# Patient Record
Sex: Female | Born: 1950 | Race: White | Hispanic: No | Marital: Married | Smoking: Never smoker
Health system: Southern US, Community
[De-identification: ages and names within clinical notes are randomized; demographics above are authoritative.]

## PROBLEM LIST (undated history)

## (undated) DIAGNOSIS — I2510 Atherosclerotic heart disease of native coronary artery without angina pectoris: Secondary | ICD-10-CM

## (undated) DIAGNOSIS — R0600 Dyspnea, unspecified: Secondary | ICD-10-CM

## (undated) DIAGNOSIS — I10 Essential (primary) hypertension: Secondary | ICD-10-CM

## (undated) DIAGNOSIS — J189 Pneumonia, unspecified organism: Secondary | ICD-10-CM

## (undated) DIAGNOSIS — K219 Gastro-esophageal reflux disease without esophagitis: Secondary | ICD-10-CM

## (undated) DIAGNOSIS — J45909 Unspecified asthma, uncomplicated: Secondary | ICD-10-CM

## (undated) DIAGNOSIS — N189 Chronic kidney disease, unspecified: Secondary | ICD-10-CM

## (undated) DIAGNOSIS — F329 Major depressive disorder, single episode, unspecified: Secondary | ICD-10-CM

## (undated) DIAGNOSIS — T7840XA Allergy, unspecified, initial encounter: Secondary | ICD-10-CM

## (undated) DIAGNOSIS — M1990 Unspecified osteoarthritis, unspecified site: Secondary | ICD-10-CM

## (undated) DIAGNOSIS — F32A Depression, unspecified: Secondary | ICD-10-CM

## (undated) DIAGNOSIS — F419 Anxiety disorder, unspecified: Secondary | ICD-10-CM

## (undated) DIAGNOSIS — E785 Hyperlipidemia, unspecified: Secondary | ICD-10-CM

## (undated) DIAGNOSIS — H269 Unspecified cataract: Secondary | ICD-10-CM

## (undated) DIAGNOSIS — E119 Type 2 diabetes mellitus without complications: Secondary | ICD-10-CM

## (undated) HISTORY — DX: Essential (primary) hypertension: I10

## (undated) HISTORY — DX: Hyperlipidemia, unspecified: E78.5

## (undated) HISTORY — DX: Chronic kidney disease, unspecified: N18.9

## (undated) HISTORY — DX: Gastro-esophageal reflux disease without esophagitis: K21.9

## (undated) HISTORY — DX: Unspecified cataract: H26.9

## (undated) HISTORY — DX: Depression, unspecified: F32.A

## (undated) HISTORY — PX: BREAST EXCISIONAL BIOPSY: SUR124

## (undated) HISTORY — DX: Type 2 diabetes mellitus without complications: E11.9

## (undated) HISTORY — PX: ABDOMINAL HYSTERECTOMY: SHX81

## (undated) HISTORY — PX: REPLACEMENT TOTAL KNEE BILATERAL: SUR1225

## (undated) HISTORY — DX: Unspecified asthma, uncomplicated: J45.909

## (undated) HISTORY — DX: Unspecified osteoarthritis, unspecified site: M19.90

## (undated) HISTORY — PX: TUBAL LIGATION: SHX77

## (undated) HISTORY — DX: Anxiety disorder, unspecified: F41.9

## (undated) HISTORY — DX: Allergy, unspecified, initial encounter: T78.40XA

---
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
## 2011-04-15 DIAGNOSIS — Z96659 Presence of unspecified artificial knee joint: Secondary | ICD-10-CM | POA: Insufficient documentation

## 2011-04-15 DIAGNOSIS — K219 Gastro-esophageal reflux disease without esophagitis: Secondary | ICD-10-CM | POA: Insufficient documentation

## 2013-05-01 DIAGNOSIS — F32A Depression, unspecified: Secondary | ICD-10-CM | POA: Insufficient documentation

## 2014-06-02 DIAGNOSIS — Z9109 Other allergy status, other than to drugs and biological substances: Secondary | ICD-10-CM | POA: Insufficient documentation

## 2014-06-02 DIAGNOSIS — J4540 Moderate persistent asthma, uncomplicated: Secondary | ICD-10-CM | POA: Insufficient documentation

## 2015-07-15 DIAGNOSIS — K51511 Left sided colitis with rectal bleeding: Secondary | ICD-10-CM | POA: Insufficient documentation

## 2016-02-22 HISTORY — PX: CHOLECYSTECTOMY: SHX55

## 2016-08-31 DIAGNOSIS — G4730 Sleep apnea, unspecified: Secondary | ICD-10-CM | POA: Insufficient documentation

## 2016-08-31 DIAGNOSIS — F319 Bipolar disorder, unspecified: Secondary | ICD-10-CM | POA: Insufficient documentation

## 2016-08-31 DIAGNOSIS — G4733 Obstructive sleep apnea (adult) (pediatric): Secondary | ICD-10-CM | POA: Insufficient documentation

## 2017-10-22 HISTORY — PX: CORONARY STENT PLACEMENT: SHX1402

## 2017-11-19 DIAGNOSIS — I208 Other forms of angina pectoris: Secondary | ICD-10-CM | POA: Insufficient documentation

## 2017-11-20 DIAGNOSIS — R42 Dizziness and giddiness: Secondary | ICD-10-CM | POA: Insufficient documentation

## 2018-02-15 DIAGNOSIS — Z955 Presence of coronary angioplasty implant and graft: Secondary | ICD-10-CM | POA: Insufficient documentation

## 2019-01-28 ENCOUNTER — Other Ambulatory Visit: Payer: Self-pay

## 2019-01-29 ENCOUNTER — Encounter: Payer: Self-pay | Admitting: Family Medicine

## 2019-01-29 ENCOUNTER — Ambulatory Visit (INDEPENDENT_AMBULATORY_CARE_PROVIDER_SITE_OTHER): Payer: Medicare HMO | Admitting: Family Medicine

## 2019-01-29 VITALS — BP 178/78 | HR 57 | Temp 99.1°F | Ht 63.0 in | Wt 215.0 lb

## 2019-01-29 DIAGNOSIS — E1165 Type 2 diabetes mellitus with hyperglycemia: Secondary | ICD-10-CM | POA: Diagnosis not present

## 2019-01-29 DIAGNOSIS — R0789 Other chest pain: Secondary | ICD-10-CM

## 2019-01-29 DIAGNOSIS — I25119 Atherosclerotic heart disease of native coronary artery with unspecified angina pectoris: Secondary | ICD-10-CM

## 2019-01-29 DIAGNOSIS — Z794 Long term (current) use of insulin: Secondary | ICD-10-CM

## 2019-01-29 DIAGNOSIS — Z955 Presence of coronary angioplasty implant and graft: Secondary | ICD-10-CM

## 2019-01-29 DIAGNOSIS — Z7689 Persons encountering health services in other specified circumstances: Secondary | ICD-10-CM

## 2019-01-29 LAB — BAYER DCA HB A1C WAIVED: HB A1C (BAYER DCA - WAIVED): 7.8 % — ABNORMAL HIGH (ref <7.0)

## 2019-01-29 MED ORDER — PIOGLITAZONE HCL 30 MG PO TABS: 30.0000 mg | ORAL_TABLET | Freq: Every day | ORAL | 0 refills | Status: DC

## 2019-01-29 MED ORDER — MONTELUKAST SODIUM 10 MG PO TABS: ORAL_TABLET | ORAL | 3 refills | Status: DC

## 2019-01-29 MED ORDER — IPRATROPIUM-ALBUTEROL 0.5-2.5 (3) MG/3ML IN SOLN: RESPIRATORY_TRACT | 0 refills | Status: DC

## 2019-01-29 MED ORDER — FLUTICASONE PROPIONATE 50 MCG/ACT NA SUSP: 2.0000 | Freq: Every day | NASAL | 3 refills | Status: DC

## 2019-01-29 MED ORDER — CLOPIDOGREL BISULFATE 75 MG PO TABS: 75.0000 mg | ORAL_TABLET | Freq: Every day | ORAL | 1 refills | Status: DC

## 2019-01-29 MED ORDER — ALBUTEROL SULFATE HFA 108 (90 BASE) MCG/ACT IN AERS: INHALATION_SPRAY | RESPIRATORY_TRACT | 3 refills | Status: DC

## 2019-01-29 MED ORDER — METOPROLOL TARTRATE 25 MG PO TABS: 25.0000 mg | ORAL_TABLET | Freq: Two times a day (BID) | ORAL | 3 refills | Status: DC

## 2019-01-29 MED ORDER — ATORVASTATIN CALCIUM 40 MG PO TABS: 40.0000 mg | ORAL_TABLET | Freq: Every day | ORAL | 3 refills | Status: DC

## 2019-01-29 MED ORDER — LISINOPRIL 40 MG PO TABS: 40.0000 mg | ORAL_TABLET | Freq: Every day | ORAL | 3 refills | Status: DC

## 2019-01-29 NOTE — Patient Instructions (Signed)

## 2019-01-29 NOTE — Progress Notes (Signed)
Subjective: NI:OEVOJJKKX care, atypical chest HPI: Amber Reeves is a 68 y.o. female presenting to clinic today for:  1.  Atypical chest pain with history of coronary artery disease and LAD stent placement Patient with intermittent atypical chest pain.  She notes that this morning she actually had an episode this since resolved.  This started several months ago and she was actually evaluated by her cardiologist who did a stress test and told her that they could not find any evidence of ischemia.  She does have a medical history significant for coronary artery disease status post stent placement in 12/04/2017.  She was evaluated in the emergency department on 02/27/2018 and had negative ACS work-up.  She was formerly cared for by Dr. Lavada Mesi, cardiology, in [facility].  Last office visit was July 2020.  She notes that symptoms are unrelieved by her home medications and ibuprofen.  She is been taking ibuprofen intermittently for various aches and pains.  Her chest pain is sometimes accompanied by shortness of breath.  She does not report any nausea, vomiting or diaphoresis.  She reports compliance with Plavix, aspirin, atorvastatin, metoprolol.  She was once told that she should be on amlodipine as well but apparently there was some disagreement between her providers with regards to beta-blocker versus calcium channel blocker versus both.  She moved to the area in August but has not yet established with a cardiologist.  She does not seek care in the emergency department when she had her chest pain because she "did not want to worry her husband" and "would have to use GPS to get there".  2.  Type 2 diabetes with hypertension and hyperlipidemia History: No history of hospitalizations for diabetes/DKA.  No history of retinopathy or diabetic foot ulcers  She reports compliance with Actos 15 mg daily but does not compliant with her Novolin 70/30.  She reports that currently she is having  hypoglycemic episodes if she takes the prescribed dose.  She admits not regularly checking her blood sugar.  She has not had a diabetic eye exam in greater than 1 year nor has she established care with an eye doctor.  She was previously prescribed NovoLog and a long-acting insulin but notes that this was unaffordable.  She has since changed insurances.  Last A1c was noted to be 9.7 in July 2020.  Prior to that her blood sugar had been well controlled.  She notes intolerance to Metformin secondary to interactions with her other medications.  She is compliant with her metoprolol 25 mg twice daily but notes that this was previously prescribed was 12.5 mg twice daily.  She had increased it to 25 mg twice daily because she found it difficult to cut the pills in half.  She does report some chest pain as above.  No dizziness or falls.  No peripheral edema or neuropathy.  Denies any visual disturbance.  Past Medical History:  Diagnosis Date  . Allergy   . Anxiety   . Arthritis   . Asthma   . Depression   . Diabetes mellitus without complication ([place])   . GERD (gastroesophageal reflux disease)   . Hyperlipidemia   . Hypertension    Past Surgical History:  Procedure Laterality Date  . ABDOMINAL HYSTERECTOMY    . CESAREAN SECTION     3  . CHOLECYSTECTOMY  2018   Social History   Socioeconomic History  . Marital status: Married    Spouse name: Not on file  . Number  of children: Not on file  . Years of education: Not on file  . Highest education level: Not on file  Occupational History  . Not on file  Social Needs  . Financial resource strain: Not on file  . Food insecurity    Worry: Not on file    Inability: Not on file  . Transportation needs    Medical: Not on file    Non-medical: Not on file  Tobacco Use  . Smoking status: Never Smoker  . Smokeless tobacco: Never Used  Substance and Sexual Activity  . Alcohol use: Never    Frequency: Never  . Drug use: Never  . Sexual activity:  Not Currently  Lifestyle  . Physical activity    Days per week: Not on file    Minutes per session: Not on file  . Stress: Not on file  Relationships  . Social Herbalist on phone: Not on file    Gets together: Not on file    Attends religious service: Not on file    Active member of club or organization: Not on file    Attends meetings of clubs or organizations: Not on file    Relationship status: Not on file  . Intimate partner violence    Fear of current or ex partner: Not on file    Emotionally abused: Not on file    Physically abused: Not on file    Forced sexual activity: Not on file  Other Topics Concern  . Not on file  Social History Narrative   Resides at home with her husband.  She is originally from [place] but relocated to [place] and then to [place] in August 2020.  She has a son who lives about 2 hours away in [place] and a sister-in-law who lives in [place].   Current Meds  Medication Sig  . albuterol (VENTOLIN HFA) 108 (90 Base) MCG/ACT inhaler INHALE 2 PUFFS BY MOUTH EVERY 4 HOURS AS NEEDED FOR WHEEZE  . Alcohol Swabs (B-D SINGLE USE SWABS REGULAR) PADS Apply topically.  Marland Kitchen atorvastatin (LIPITOR) 40 MG tablet Take by mouth.  . Blood Glucose Calibration (ACCU-CHEK AVIVA) SOLN 2 Bottles by Other route as needed.  . clopidogrel (PLAVIX) 75 MG tablet Take by mouth.  . docusate sodium (COLACE) 100 MG capsule Take by mouth.  . fluticasone (FLONASE) 50 MCG/ACT nasal spray Place into the nose.  Marland Kitchen glucose blood test strip TEST BLOOD SUGAR TWICE DAILY  . insulin NPH-regular Human (70-30) 100 UNIT/ML injection 45 unit bid before meals  . Insulin Syringe-Needle U-100 31G X 5/16" 0.5 ML MISC Use to inject Insulin four times daily: Use as directed; Dx: E11.9, E66.9  . ipratropium-albuterol (DUONEB) 0.5-2.5 (3) MG/3ML SOLN TAKE 3 ML (1 VIAL) BY NEBULIZATION EVERY 6 HOURS AS NEEDED FOR WHEEZING.  . isosorbide mononitrate (IMDUR) 30 MG 24 hr tablet Take by mouth.   . lansoprazole (PREVACID) 30 MG capsule Take by mouth.  Marland Kitchen lisinopril (ZESTRIL) 40 MG tablet TAKE 1 TABLET BY MOUTH DAILY.  . magnesium oxide (MAG-OX) 400 (241.3 Mg) MG tablet TAKE 1 TABLET EVERY DAY  . metoprolol tartrate (LOPRESSOR) 25 MG tablet 25 mg 2 (two) times daily.   . montelukast (SINGULAIR) 10 MG tablet TAKE 1 TABLET EVERY NIGHT  . pioglitazone (ACTOS) 15 MG tablet Take by mouth.  . potassium chloride (KLOR-CON) 10 MEQ tablet Take by mouth.   Family History  Problem Relation Age of Onset  . Heart disease Mother   .  Stroke Mother   . Cancer Father   . Stroke Father    Allergies  Allergen Reactions  . Penicillins Rash  . Paxil [Paroxetine Hcl]   . Prozac [Fluoxetine Hcl]   . Zoloft [Sertraline Hcl]      Health Maintenance: Flu, Prevnar  ROS: Per HPI  Objective: Office vital signs reviewed. BP (!) 179/74   Pulse (!) 57   Temp 99.1 F (37.3 C) (Temporal)   Ht 5' 3"  (1.6 m)   Wt 215 lb (97.5 kg)   SpO2 98%   BMI 38.09 kg/m   Physical Examination:  General: Awake, alert, obese, No acute distress HEENT: Normal; sclera white.  Moist mucous membranes Cardio: regular rate and rhythm, S1S2 heard, no murmurs appreciated Pulm: clear to auscultation bilaterally, no wheezes, rhonchi or rales; normal work of breathing on room air Extremities: warm, well perfused, 1-2+ edema noted to mid shins bilaterally. No cyanosis or clubbing; +2 pulses bilaterally MSK: Normal tone.  Ambulates independently Psych: Mood stable, speech normal, affect appropriate, appears mildly anxious.  Thought process linear. Depression screen PHQ 2/9 01/29/2019  Decreased Interest 1  Down, Depressed, Hopeless 1  PHQ - 2 Score 2  Tired, decreased energy 1  Change in appetite 0  Feeling bad or failure about yourself  0  Trouble concentrating 0  Moving slowly or fidgety/restless 0  Suicidal thoughts 0  Difficult doing work/chores Not difficult at all    Assessment/ Plan: 68 y.o. female   1.  History of coronary artery stent placement I have placed an urgent referral to cardiology given reports of atypical chest pain.  From her cardiology notes this appears that been worked up and was negative.  However, patient was insistent upon the fact that she has issues despite negative stress testing and that previous findings were only found on catheterization of the heart.  I have placed her cardiology notes for faxing to the specialist that will be seeing her.  However, she also has a copy that she will bring.  EKG was obtained today but I do not appreciate any ischemic changes.  I stressed the importance of sugar and blood pressure control.  She will resume monitoring her blood pressures at home and follow-up in about 2 weeks for recheck here.  We may need to consider addition of the Norvasc for blood pressure control. - Ambulatory referral to Cardiology - clopidogrel (PLAVIX) 75 MG tablet; Take 1 tablet (75 mg total) by mouth daily.  Dispense: 90 tablet; Refill: 1  2. Coronary artery disease involving native coronary artery of native heart with angina pectoris (HCC) Intermittently compliant with Lasix.  Some lower extremity edema was noted on today's exam.  Again could be multifactorial. - furosemide (LASIX) 20 MG tablet; 1 tablet - isosorbide mononitrate (IMDUR) 30 MG 24 hr tablet; Take by mouth. - Ambulatory referral to Cardiology - clopidogrel (PLAVIX) 75 MG tablet; Take 1 tablet (75 mg total) by mouth daily.  Dispense: 90 tablet; Refill: 1  3. Establishing care with new doctor, encounter for I reviewed the records that were readily available.  Unfortunately, despite having an A1c available from the last 6 months there was no PCP note to accompany.  4. Type 2 diabetes mellitus with hyperglycemia, with long-term current use of insulin (HCC) A1c is somewhat better than her last check but was still elevated today at 7.8.  I have increased her Actos to 30 mg.  She has not really been compliant  with the insulin so for now we  will hold off on that and see if we can control her blood sugar with medication alone.  She will follow-up in 3 months with me for blood sugar recheck - CMP14+EGFR - Bayer DCA Hb A1c Waived - pioglitazone (ACTOS) 30 MG tablet; Take 1 tablet (30 mg total) by mouth daily.  Dispense: 90 tablet; Refill: 0  5. Atypical chest pain See above.  I encouraged the patient to seek immediate medical attention the emergency department should symptoms recur.  She was good understanding of the plan. - EKG 12-Lead  Total time spent with patient 46 minutes.  Greater than 50% of encounter spent in coordination of care/counseling.  Janora Norlander, DO [place] [phone]

## 2019-01-30 LAB — CMP14+EGFR
ALT: 16 IU/L (ref 0–32)
AST: 17 IU/L (ref 0–40)
Albumin/Globulin Ratio: 1.3 (ref 1.2–2.2)
Albumin: 4 g/dL (ref 3.8–4.8)
Alkaline Phosphatase: 67 IU/L (ref 39–117)
BUN/Creatinine Ratio: 15 (ref 12–28)
BUN: 14 mg/dL (ref 8–27)
Bilirubin Total: 0.6 mg/dL (ref 0.0–1.2)
CO2: 27 mmol/L (ref 20–29)
Calcium: 9.4 mg/dL (ref 8.7–10.3)
Chloride: 101 mmol/L (ref 96–106)
Creatinine, Ser: 0.93 mg/dL (ref 0.57–1.00)
GFR calc Af Amer: 73 mL/min/{1.73_m2} (ref >59)
GFR calc non Af Amer: 63 mL/min/{1.73_m2} (ref >59)
Globulin, Total: 3.2 g/dL (ref 1.5–4.5)
Glucose: 122 mg/dL — ABNORMAL HIGH (ref 65–99)
Potassium: 4.1 mmol/L (ref 3.5–5.2)
Sodium: 140 mmol/L (ref 134–144)
Total Protein: 7.2 g/dL (ref 6.0–8.5)

## 2019-02-01 ENCOUNTER — Encounter: Payer: Self-pay | Admitting: Cardiology

## 2019-02-01 ENCOUNTER — Other Ambulatory Visit: Payer: Self-pay

## 2019-02-01 ENCOUNTER — Ambulatory Visit: Payer: Medicare HMO | Admitting: Cardiology

## 2019-02-01 VITALS — BP 154/76 | HR 56 | Temp 97.9°F | Ht 63.0 in | Wt 215.0 lb

## 2019-02-01 DIAGNOSIS — Z01812 Encounter for preprocedural laboratory examination: Secondary | ICD-10-CM | POA: Diagnosis not present

## 2019-02-01 DIAGNOSIS — I25119 Atherosclerotic heart disease of native coronary artery with unspecified angina pectoris: Secondary | ICD-10-CM

## 2019-02-01 DIAGNOSIS — R079 Chest pain, unspecified: Secondary | ICD-10-CM

## 2019-02-01 MED ORDER — ISOSORBIDE MONONITRATE ER 30 MG PO TB24: 45.0000 mg | ORAL_TABLET | Freq: Every day | ORAL | 3 refills | Status: DC

## 2019-02-01 NOTE — H&P (View-Only) (Signed)
Clinical Summary [name] is a 68 y.o.female seen as new consult, referred by Dr Amber Reeves  1. CAD -11/2017 cath at [facility] showed >70% mid LAD, otherwise patent vessels. She presented with chest pain, not an MI - received DES to LAD - 11/2017 echo  LVEF Complete echocardiogram shows a left ventricular EF by visual approximation 60 to 65%. Normal left ventricular systolic function. Grade 1 left ventricular diastolic function. No significant valvular disease. No pericardial effusion  01/2018 stress test: THR not achieved, exercise limiting angina , duke treadmill score neg 1  - she reports prior false negative stress tests in the past.  - has had recent chest pain. Midchest, can occur at rest or with activity. Sharp pain, 8/10 in severity. Has not noticed if positional. Lasts a few hours, not sure if coming and going or consistent. No other associated symptoms. Pain is similar to pain prior to her stent.  - activity limited by back pains, knee pains.  - can have some DOE with activities - over the last few weeks, increasing pain symptoms.   - 09/04/18 nuclear stress by prior cardiollogist showed anterior breast artifact, probably normal, consider CTA      2. HTN - renal artery angio at time of 11/2017 cath showed no significant renal artery disease   3. Hyperlipidemia - compliant with statin  4. OSA on cpap     Past Medical History:  Diagnosis Date  . Allergy   . Anxiety   . Arthritis   . Asthma   . Depression   . Diabetes mellitus without complication ([place])   . GERD (gastroesophageal reflux disease)   . Hyperlipidemia   . Hypertension      Allergies  Allergen Reactions  . Penicillins Rash  . Paxil [Paroxetine Hcl]   . Prozac [Fluoxetine Hcl]   . Zoloft [Sertraline Hcl]      Current Outpatient Medications  Medication Sig Dispense Refill  . albuterol (VENTOLIN HFA) 108 (90 Base) MCG/ACT inhaler INHALE 2 PUFFS BY MOUTH EVERY 4 HOURS AS NEEDED FOR  WHEEZE 18 g 3  . Alcohol Swabs (B-D SINGLE USE SWABS REGULAR) PADS Apply topically.    Marland Kitchen aspirin EC 81 MG tablet Take 81 mg by mouth 2 (two) times daily.     Marland Kitchen atorvastatin (LIPITOR) 40 MG tablet Take 1 tablet (40 mg total) by mouth daily at 6 PM. 90 tablet 3  . Blood Glucose Calibration (ACCU-CHEK AVIVA) SOLN 2 Bottles by Other route as needed.    . Calcium Citrate-Vitamin D 315-250 MG-UNIT TABS Take by mouth.    . clopidogrel (PLAVIX) 75 MG tablet Take 1 tablet (75 mg total) by mouth daily. 90 tablet 1  . docusate sodium (COLACE) 100 MG capsule Take by mouth.    . fluticasone (FLONASE) 50 MCG/ACT nasal spray Place 2 sprays into both nostrils daily. 48 g 3  . furosemide (LASIX) 20 MG tablet 1 tablet    . glucose blood test strip TEST BLOOD SUGAR TWICE DAILY    . insulin NPH-regular Human (70-30) 100 UNIT/ML injection 45 unit bid before meals    . Insulin Syringe-Needle U-100 31G X 5/16" 0.5 ML MISC Use to inject Insulin four times daily: Use as directed; Dx: E11.9, E66.9    . ipratropium-albuterol (DUONEB) 0.5-2.5 (3) MG/3ML SOLN TAKE 3 ML (1 VIAL) BY NEBULIZATION EVERY 6 HOURS AS NEEDED FOR WHEEZING. 360 mL 0  . isosorbide mononitrate (IMDUR) 30 MG 24 hr tablet Take by mouth.    Marland Kitchen  lansoprazole (PREVACID) 30 MG capsule Take by mouth.    Marland Kitchen lisinopril (ZESTRIL) 40 MG tablet Take 1 tablet (40 mg total) by mouth daily. 90 tablet 3  . magnesium oxide (MAG-OX) 400 (241.3 Mg) MG tablet TAKE 1 TABLET EVERY DAY    . metoprolol tartrate (LOPRESSOR) 25 MG tablet Take 1 tablet (25 mg total) by mouth 2 (two) times daily. 180 tablet 3  . montelukast (SINGULAIR) 10 MG tablet TAKE 1 TABLET EVERY NIGHT 90 tablet 3  . pioglitazone (ACTOS) 30 MG tablet Take 1 tablet (30 mg total) by mouth daily. 90 tablet 0  . potassium chloride (KLOR-CON) 10 MEQ tablet Take by mouth.     No current facility-administered medications for this visit.     Past Surgical History:  Procedure Laterality Date  . ABDOMINAL  HYSTERECTOMY    . CESAREAN SECTION     3  . CHOLECYSTECTOMY  2018  . CORONARY STENT PLACEMENT  10/2017   [place] [facility])  . REPLACEMENT TOTAL KNEE BILATERAL Bilateral    done in [place] (2003/2005)     Allergies  Allergen Reactions  . Penicillins Rash  . Paxil [Paroxetine Hcl]   . Prozac [Fluoxetine Hcl]   . Zoloft [Sertraline Hcl]       Family History  Problem Relation Age of Onset  . Heart disease Mother   . Stroke Mother   . Cancer Father   . Stroke Father      Social History [name] reports that she has never smoked. She has never used smokeless tobacco. [name] reports no history of alcohol use.   Review of Systems CONSTITUTIONAL: No weight loss, fever, chills, weakness or fatigue.  HEENT: Eyes: No visual loss, blurred vision, double vision or yellow sclerae.No hearing loss, sneezing, congestion, runny nose or sore throat.  SKIN: No rash or itching.  CARDIOVASCULAR: per hpi RESPIRATORY: No shortness of breath, cough or sputum.  GASTROINTESTINAL: No anorexia, nausea, vomiting or diarrhea. No abdominal pain or blood.  GENITOURINARY: No burning on urination, no polyuria NEUROLOGICAL: No headache, dizziness, syncope, paralysis, ataxia, numbness or tingling in the extremities. No change in bowel or bladder control.  MUSCULOSKELETAL: No muscle, back pain, joint pain or stiffness.  LYMPHATICS: No enlarged nodes. No history of splenectomy.  PSYCHIATRIC: No history of depression or anxiety.  ENDOCRINOLOGIC: No reports of sweating, cold or heat intolerance. No polyuria or polydipsia.  Marland Kitchen   Physical Examination Today's Vitals   02/01/19 1000  BP: (!) 154/76  Pulse: (!) 56  Temp: 97.9 F (36.6 C)  TempSrc: Temporal  SpO2: 96%  Weight: 215 lb (97.5 kg)  Height: 5' 3"  (1.6 m)   Body mass index is 38.09 kg/m.  Gen: resting comfortably, no acute distress HEENT: no scleral icterus, pupils equal round and reactive, no palptable cervical  adenopathy,  CV: RRR, no m/r/g, no jvd Resp: Clear to auscultation bilaterally GI: abdomen is soft, non-tender, non-distended, normal bowel sounds, no hepatosplenomegaly MSK: extremities are warm, no edema.  Skin: warm, no rash Neuro:  no focal deficits Psych: appropriate affect   Diagnostic Studies 11/2017 cath [facility] Severe >70% long mid LAD stenosis No other significant angiographic stenosis in the other coronary arteries  DES to mid LAD   Malignant hypertension - selective renal angio without any evidence of  renal artery stenosis.   Renal angio procedure:  A Tiger 4 catheter was used to selectively engage both the left and right  renal arteries and was used to perform  selective angiography.   Renal artery anatomy:  The left kidney is supplied by a single renal artery which has no  angiographic stenosis  The right kidney is supplied by a single rena artery which has no  angiographic stenosis    Assessment and Plan  1. CAD - prior history as reported - recent symptoms similar to her prior chest pains before stent. 08/2018 stress test with pcp with breast attenuatin, no clear ischemia. - given ongoing symptoms and her overall concern about recurrent CAD will plan for a cath to definitively evaluate. She has asked to be done after the holidays. In the mean time increase imdur to 76m daily  2. HTN - elevated today, we are increasing her imdur in setting of chest pain, follow bp's for now.   3. Hyperlipidemia  - continue statin  I have reviewed the risks, indications, and alternatives to cardiac catheterization, possible angioplasty, and stenting with the patient today. Risks include but are not limited to bleeding, infection, vascular injury, stroke, myocardial infection, arrhythmia, kidney injury, radiation-related injury in the case of prolonged fluoroscopy use, emergency cardiac surgery, and death. The patient understands the risks of serious complication is  1-2 in 12620with diagnostic cardiac cath and 1-2% or less with angioplasty/stenting.    F/u mid January   JArnoldo Lenis M.D.

## 2019-02-01 NOTE — Patient Instructions (Signed)
Medication Instructions:  INCREASE IMDUR TO 45 MG DAILY (1 1/2 TABLET)   Labwork: BMET  Testing/Procedures: Your physician has requested that you have a cardiac catheterization. Cardiac catheterization is used to diagnose and/or treat various heart conditions. Doctors may recommend this procedure for a number of different reasons. The most common reason is to evaluate chest pain. Chest pain can be a symptom of coronary artery disease (CAD), and cardiac catheterization can show whether plaque is narrowing or blocking your heart's arteries. This procedure is also used to evaluate the valves, as well as measure the blood flow and oxygen levels in different parts of your heart. For further information please visit [email or web address]. Please follow instruction sheet, as given.    Follow-Up: Your physician recommends that you schedule a follow-up appointment in: MID January    Any Other Special Instructions Will Be Listed Below (If Applicable).     If you need a refill on your cardiac medications before your next appointment, please call your pharmacy.

## 2019-02-01 NOTE — Progress Notes (Signed)
Clinical Summary Ms. Milliner is a 68 y.o.female seen as new consult, referred by Dr Lajuana Ripple  1. CAD -11/2017 cath at [facility] showed >70% mid LAD, otherwise patent vessels. She presented with chest pain, not an MI - received DES to LAD - 11/2017 echo  LVEF Complete echocardiogram shows a left ventricular EF by visual approximation 60 to 65%. Normal left ventricular systolic function. Grade 1 left ventricular diastolic function. No significant valvular disease. No pericardial effusion  01/2018 stress test: THR not achieved, exercise limiting angina , [facility] treadmill score neg 1  - she reports prior false negative stress tests in the past.  - has had recent chest pain. Midchest, can occur at rest or with activity. Sharp pain, 8/10 in severity. Has not noticed if positional. Lasts a few hours, not sure if coming and going or consistent. No other associated symptoms. Pain is similar to pain prior to her stent.  - activity limited by back pains, knee pains.  - can have some DOE with activities - over the last few weeks, increasing pain symptoms.   - 09/04/18 nuclear stress by prior cardiollogist showed anterior breast artifact, probably normal, consider CTA      2. HTN - renal artery angio at time of 11/2017 cath showed no significant renal artery disease   3. Hyperlipidemia - compliant with statin  4. OSA on cpap     Past Medical History:  Diagnosis Date  . Allergy   . Anxiety   . Arthritis   . Asthma   . Depression   . Diabetes mellitus without complication ([place])   . GERD (gastroesophageal reflux disease)   . Hyperlipidemia   . Hypertension      Allergies  Allergen Reactions  . Penicillins Rash  . Paxil [Paroxetine Hcl]   . Prozac [Fluoxetine Hcl]   . Zoloft [Sertraline Hcl]      Current Outpatient Medications  Medication Sig Dispense Refill  . albuterol (VENTOLIN HFA) 108 (90 Base) MCG/ACT inhaler INHALE 2 PUFFS BY MOUTH EVERY 4 HOURS AS NEEDED FOR  WHEEZE 18 g 3  . Alcohol Swabs (B-D SINGLE USE SWABS REGULAR) PADS Apply topically.    Marland Kitchen aspirin EC 81 MG tablet Take 81 mg by mouth 2 (two) times daily.     Marland Kitchen atorvastatin (LIPITOR) 40 MG tablet Take 1 tablet (40 mg total) by mouth daily at 6 PM. 90 tablet 3  . Blood Glucose Calibration (ACCU-CHEK AVIVA) SOLN 2 Bottles by Other route as needed.    . Calcium Citrate-Vitamin D 315-250 MG-UNIT TABS Take by mouth.    . clopidogrel (PLAVIX) 75 MG tablet Take 1 tablet (75 mg total) by mouth daily. 90 tablet 1  . docusate sodium (COLACE) 100 MG capsule Take by mouth.    . fluticasone (FLONASE) 50 MCG/ACT nasal spray Place 2 sprays into both nostrils daily. 48 g 3  . furosemide (LASIX) 20 MG tablet 1 tablet    . glucose blood test strip TEST BLOOD SUGAR TWICE DAILY    . insulin NPH-regular Human (70-30) 100 UNIT/ML injection 45 unit bid before meals    . Insulin Syringe-Needle U-100 31G X 5/16" 0.5 ML MISC Use to inject Insulin four times daily: Use as directed; Dx: E11.9, E66.9    . ipratropium-albuterol (DUONEB) 0.5-2.5 (3) MG/3ML SOLN TAKE 3 ML (1 VIAL) BY NEBULIZATION EVERY 6 HOURS AS NEEDED FOR WHEEZING. 360 mL 0  . isosorbide mononitrate (IMDUR) 30 MG 24 hr tablet Take by mouth.    Marland Kitchen  lansoprazole (PREVACID) 30 MG capsule Take by mouth.    Marland Kitchen lisinopril (ZESTRIL) 40 MG tablet Take 1 tablet (40 mg total) by mouth daily. 90 tablet 3  . magnesium oxide (MAG-OX) 400 (241.3 Mg) MG tablet TAKE 1 TABLET EVERY DAY    . metoprolol tartrate (LOPRESSOR) 25 MG tablet Take 1 tablet (25 mg total) by mouth 2 (two) times daily. 180 tablet 3  . montelukast (SINGULAIR) 10 MG tablet TAKE 1 TABLET EVERY NIGHT 90 tablet 3  . pioglitazone (ACTOS) 30 MG tablet Take 1 tablet (30 mg total) by mouth daily. 90 tablet 0  . potassium chloride (KLOR-CON) 10 MEQ tablet Take by mouth.     No current facility-administered medications for this visit.     Past Surgical History:  Procedure Laterality Date  . ABDOMINAL  HYSTERECTOMY    . CESAREAN SECTION     3  . CHOLECYSTECTOMY  2018  . CORONARY STENT PLACEMENT  10/2017   [facility])  . REPLACEMENT TOTAL KNEE BILATERAL Bilateral    done in [place] (2003/2005)     Allergies  Allergen Reactions  . Penicillins Rash  . Paxil [Paroxetine Hcl]   . Prozac [Fluoxetine Hcl]   . Zoloft [Sertraline Hcl]       Family History  Problem Relation Age of Onset  . Heart disease Mother   . Stroke Mother   . Cancer Father   . Stroke Father      Social History Ms. Hilario reports that she has never smoked. She has never used smokeless tobacco. Ms. Gilchrest reports no history of alcohol use.   Review of Systems CONSTITUTIONAL: No weight loss, fever, chills, weakness or fatigue.  HEENT: Eyes: No visual loss, blurred vision, double vision or yellow sclerae.No hearing loss, sneezing, congestion, runny nose or sore throat.  SKIN: No rash or itching.  CARDIOVASCULAR: per hpi RESPIRATORY: No shortness of breath, cough or sputum.  GASTROINTESTINAL: No anorexia, nausea, vomiting or diarrhea. No abdominal pain or blood.  GENITOURINARY: No burning on urination, no polyuria NEUROLOGICAL: No headache, dizziness, syncope, paralysis, ataxia, numbness or tingling in the extremities. No change in bowel or bladder control.  MUSCULOSKELETAL: No muscle, back pain, joint pain or stiffness.  LYMPHATICS: No enlarged nodes. No history of splenectomy.  PSYCHIATRIC: No history of depression or anxiety.  ENDOCRINOLOGIC: No reports of sweating, cold or heat intolerance. No polyuria or polydipsia.  Marland Kitchen   Physical Examination Today's Vitals   02/01/19 1000  BP: (!) 154/76  Pulse: (!) 56  Temp: 97.9 F (36.6 C)  TempSrc: Temporal  SpO2: 96%  Weight: 215 lb (97.5 kg)  Height: 5' 3"  (1.6 m)   Body mass index is 38.09 kg/m.  Gen: resting comfortably, no acute distress HEENT: no scleral icterus, pupils equal round and reactive, no palptable cervical  adenopathy,  CV: RRR, no m/r/g, no jvd Resp: Clear to auscultation bilaterally GI: abdomen is soft, non-tender, non-distended, normal bowel sounds, no hepatosplenomegaly MSK: extremities are warm, no edema.  Skin: warm, no rash Neuro:  no focal deficits Psych: appropriate affect   Diagnostic Studies 11/2017 cath [facility] Severe >70% long mid LAD stenosis No other significant angiographic stenosis in the other coronary arteries  DES to mid LAD   Malignant hypertension - selective renal angio without any evidence of  renal artery stenosis.   Renal angio procedure:  A Tiger 4 catheter was used to selectively engage both the left and right  renal arteries and was used to perform  selective angiography.   Renal artery anatomy:  The left kidney is supplied by a single renal artery which has no  angiographic stenosis  The right kidney is supplied by a single rena artery which has no  angiographic stenosis    Assessment and Plan  1. CAD - prior history as reported - recent symptoms similar to her prior chest pains before stent. 08/2018 stress test with pcp with breast attenuatin, no clear ischemia. - given ongoing symptoms and her overall concern about recurrent CAD will plan for a cath to definitively evaluate. She has asked to be done after the holidays. In the mean time increase imdur to 46m daily  2. HTN - elevated today, we are increasing her imdur in setting of chest pain, follow bp's for now.   3. Hyperlipidemia  - continue statin  I have reviewed the risks, indications, and alternatives to cardiac catheterization, possible angioplasty, and stenting with the patient today. Risks include but are not limited to bleeding, infection, vascular injury, stroke, myocardial infection, arrhythmia, kidney injury, radiation-related injury in the case of prolonged fluoroscopy use, emergency cardiac surgery, and death. The patient understands the risks of serious complication is  1-2 in 10071with diagnostic cardiac cath and 1-2% or less with angioplasty/stenting.    F/u mid January   JArnoldo Lenis M.D.

## 2019-02-05 ENCOUNTER — Telehealth: Payer: Self-pay

## 2019-02-05 DIAGNOSIS — I25119 Atherosclerotic heart disease of native coronary artery with unspecified angina pectoris: Secondary | ICD-10-CM

## 2019-02-06 NOTE — Telephone Encounter (Signed)
Pt made aware of procedure instructions.

## 2019-02-07 ENCOUNTER — Other Ambulatory Visit: Payer: Self-pay | Admitting: Cardiology

## 2019-02-07 DIAGNOSIS — R0789 Other chest pain: Secondary | ICD-10-CM

## 2019-02-07 MED ORDER — SODIUM CHLORIDE 0.9% FLUSH: 3.0000 mL | Freq: Two times a day (BID) | INTRAVENOUS | Status: DC

## 2019-02-20 ENCOUNTER — Other Ambulatory Visit (HOSPITAL_COMMUNITY)
Admission: RE | Admit: 2019-02-20 | Discharge: 2019-02-20 | Disposition: A | Discharge status: Home / Self Care | Payer: Medicare HMO | Source: Ambulatory Visit | Attending: Internal Medicine | Admitting: Internal Medicine

## 2019-02-20 ENCOUNTER — Other Ambulatory Visit (HOSPITAL_COMMUNITY)
Admission: RE | Admit: 2019-02-20 | Discharge: 2019-02-20 | Disposition: A | Discharge status: Home / Self Care | Payer: Medicare HMO | Source: Ambulatory Visit | Attending: Cardiology | Admitting: Cardiology

## 2019-02-20 ENCOUNTER — Other Ambulatory Visit: Payer: Self-pay

## 2019-02-20 ENCOUNTER — Other Ambulatory Visit (HOSPITAL_COMMUNITY): Payer: Medicare HMO

## 2019-02-20 DIAGNOSIS — I25119 Atherosclerotic heart disease of native coronary artery with unspecified angina pectoris: Secondary | ICD-10-CM | POA: Diagnosis not present

## 2019-02-20 DIAGNOSIS — Z20828 Contact with and (suspected) exposure to other viral communicable diseases: Secondary | ICD-10-CM | POA: Diagnosis not present

## 2019-02-20 DIAGNOSIS — Z01812 Encounter for preprocedural laboratory examination: Secondary | ICD-10-CM | POA: Insufficient documentation

## 2019-02-20 LAB — CBC WITH DIFFERENTIAL/PLATELET
Abs Immature Granulocytes: 0.01 10*3/uL (ref 0.00–0.07)
Basophils Absolute: 0.1 10*3/uL (ref 0.0–0.1)
Basophils Relative: 1 %
Eosinophils Absolute: 0.3 10*3/uL (ref 0.0–0.5)
Eosinophils Relative: 4 %
HCT: 41.5 % (ref 36.0–46.0)
Hemoglobin: 12.9 g/dL (ref 12.0–15.0)
Immature Granulocytes: 0 %
Lymphocytes Relative: 39 %
Lymphs Abs: 2.5 10*3/uL (ref 0.7–4.0)
MCH: 30.6 pg (ref 26.0–34.0)
MCHC: 31.1 g/dL (ref 30.0–36.0)
MCV: 98.6 fL (ref 80.0–100.0)
Monocytes Absolute: 0.6 10*3/uL (ref 0.1–1.0)
Monocytes Relative: 8 %
Neutro Abs: 3.2 10*3/uL (ref 1.7–7.7)
Neutrophils Relative %: 48 %
Platelets: 227 10*3/uL (ref 150–400)
RBC: 4.21 MIL/uL (ref 3.87–5.11)
RDW: 13.7 % (ref 11.5–15.5)
WBC: 6.6 10*3/uL (ref 4.0–10.5)
nRBC: 0 % (ref 0.0–0.2)

## 2019-02-20 LAB — BASIC METABOLIC PANEL
Anion gap: 10 (ref 5–15)
BUN: 14 mg/dL (ref 8–23)
CO2: 26 mmol/L (ref 22–32)
Calcium: 9.1 mg/dL (ref 8.9–10.3)
Chloride: 100 mmol/L (ref 98–111)
Creatinine, Ser: 1.02 mg/dL — ABNORMAL HIGH (ref 0.44–1.00)
GFR calc Af Amer: >60 mL/min (ref >60)
GFR calc non Af Amer: 56 mL/min — ABNORMAL LOW (ref >60)
Glucose, Bld: 167 mg/dL — ABNORMAL HIGH (ref 70–99)
Potassium: 4.4 mmol/L (ref 3.5–5.1)
Sodium: 136 mmol/L (ref 135–145)

## 2019-02-20 LAB — SARS CORONAVIRUS 2 (TAT 6-24 HRS): SARS Coronavirus 2: NEGATIVE

## 2019-02-21 ENCOUNTER — Other Ambulatory Visit: Payer: Self-pay | Admitting: Cardiology

## 2019-02-21 ENCOUNTER — Telehealth: Payer: Self-pay

## 2019-02-21 NOTE — Telephone Encounter (Signed)
The following instructions were reviewed with the patient. The patient has no questions at this time and verbalized understanding of these instructions.  Pt contacted pre-catheterization scheduled at [facility] for: 07:30 am  Verified arrival time and place: [place] [facility]) at: 05:30 am  No solid food after midnight prior to cath, clear liquids until 5 AM day of procedure.  Contrast allergy: No  Verified no diabetes medications.Patient is on diabetic medication  AM meds can be  taken pre-cath with sip of water including: ASA 81 mg  Patient is instructed to hold her Actos, NPH insulin and Potassium medication the morning of the procedure.  Confirmed patient has responsible adult to drive home post procedure and observe 24 hours after arriving home: The patient has a responsible party to drive her home and observe her for 24 hours after the procedure.  Currently, due to Covid-19 pandemic, only one support person will be allowed with patient. Must be the same support person for that patient's entire stay, will be screened and required to wear a mask. They will be asked to wait in the waiting room for the duration of the patient's stay.  Patients are required to wear a mask when they enter the hospital.      COVID-19 Pre-Screening Questions:  . In the past 7 to 10 days have you had a cough,  shortness of breath, headache, congestion, fever (100 or greater) body aches, chills, sore throat, or sudden loss of taste or sense of smell? No . Have you been around anyone with known Covid 19? No . Have you been around anyone who is awaiting Covid 19 test results in the past 7 to 10 days? No . Have you been around anyone who has been exposed to Covid 19, or has mentioned symptoms of Covid 19 within the past 7 to 10 days? No  If you have any concerns/questions about symptoms patients report during screening (either on the phone or at threshold). Contact the provider  seeing the patient or DOD for further guidance.  If neither are available contact a member of the leadership team.

## 2019-02-25 ENCOUNTER — Encounter (HOSPITAL_COMMUNITY)
Admission: RE | Disposition: A | Discharge status: Home / Self Care | Payer: Medicare HMO | Source: Home / Self Care | Attending: Internal Medicine

## 2019-02-25 ENCOUNTER — Ambulatory Visit (HOSPITAL_COMMUNITY)
Admission: RE | Admit: 2019-02-25 | Discharge: 2019-02-25 | Disposition: A | Discharge status: Home / Self Care | Payer: Medicare HMO | Attending: Internal Medicine | Admitting: Internal Medicine

## 2019-02-25 ENCOUNTER — Other Ambulatory Visit: Payer: Self-pay

## 2019-02-25 DIAGNOSIS — Z79899 Other long term (current) drug therapy: Secondary | ICD-10-CM | POA: Diagnosis not present

## 2019-02-25 DIAGNOSIS — Z880 Allergy status to penicillin: Secondary | ICD-10-CM | POA: Diagnosis not present

## 2019-02-25 DIAGNOSIS — R0789 Other chest pain: Secondary | ICD-10-CM

## 2019-02-25 DIAGNOSIS — Z7982 Long term (current) use of aspirin: Secondary | ICD-10-CM | POA: Diagnosis not present

## 2019-02-25 DIAGNOSIS — E119 Type 2 diabetes mellitus without complications: Secondary | ICD-10-CM | POA: Insufficient documentation

## 2019-02-25 DIAGNOSIS — Z7902 Long term (current) use of antithrombotics/antiplatelets: Secondary | ICD-10-CM | POA: Diagnosis not present

## 2019-02-25 DIAGNOSIS — J45909 Unspecified asthma, uncomplicated: Secondary | ICD-10-CM | POA: Diagnosis not present

## 2019-02-25 DIAGNOSIS — M1990 Unspecified osteoarthritis, unspecified site: Secondary | ICD-10-CM | POA: Diagnosis not present

## 2019-02-25 DIAGNOSIS — I10 Essential (primary) hypertension: Secondary | ICD-10-CM | POA: Diagnosis not present

## 2019-02-25 DIAGNOSIS — E785 Hyperlipidemia, unspecified: Secondary | ICD-10-CM | POA: Diagnosis not present

## 2019-02-25 DIAGNOSIS — F329 Major depressive disorder, single episode, unspecified: Secondary | ICD-10-CM | POA: Insufficient documentation

## 2019-02-25 DIAGNOSIS — F419 Anxiety disorder, unspecified: Secondary | ICD-10-CM | POA: Insufficient documentation

## 2019-02-25 DIAGNOSIS — G4733 Obstructive sleep apnea (adult) (pediatric): Secondary | ICD-10-CM | POA: Insufficient documentation

## 2019-02-25 DIAGNOSIS — Z794 Long term (current) use of insulin: Secondary | ICD-10-CM | POA: Insufficient documentation

## 2019-02-25 DIAGNOSIS — I25118 Atherosclerotic heart disease of native coronary artery with other forms of angina pectoris: Secondary | ICD-10-CM | POA: Insufficient documentation

## 2019-02-25 DIAGNOSIS — K219 Gastro-esophageal reflux disease without esophagitis: Secondary | ICD-10-CM | POA: Diagnosis not present

## 2019-02-25 DIAGNOSIS — I25119 Atherosclerotic heart disease of native coronary artery with unspecified angina pectoris: Secondary | ICD-10-CM | POA: Diagnosis not present

## 2019-02-25 HISTORY — PX: LEFT HEART CATH AND CORONARY ANGIOGRAPHY: CATH118249

## 2019-02-25 LAB — GLUCOSE, CAPILLARY: Glucose-Capillary: 170 mg/dL — ABNORMAL HIGH (ref 70–99)

## 2019-02-25 SURGERY — LEFT HEART CATH AND CORONARY ANGIOGRAPHY
Anesthesia: LOCAL

## 2019-02-25 MED ORDER — SODIUM CHLORIDE 0.9 % WEIGHT BASED INFUSION
3.0000 mL/kg/h | INTRAVENOUS | Status: AC
  Administered 2019-02-25: 3 mL/kg/h via INTRAVENOUS

## 2019-02-25 MED ORDER — HEPARIN (PORCINE) IN NACL 1000-0.9 UT/500ML-% IV SOLN
INTRAVENOUS | Status: AC
  Filled 2019-02-25: qty 1000

## 2019-02-25 MED ORDER — LIDOCAINE HCL (PF) 1 % IJ SOLN
INTRAMUSCULAR | Status: DC | PRN
  Administered 2019-02-25: 2 mL via INTRADERMAL

## 2019-02-25 MED ORDER — SODIUM CHLORIDE 0.9% FLUSH: 3.0000 mL | INTRAVENOUS | Status: DC | PRN

## 2019-02-25 MED ORDER — LABETALOL HCL 5 MG/ML IV SOLN: 10.0000 mg | INTRAVENOUS | Status: DC | PRN

## 2019-02-25 MED ORDER — HEPARIN SODIUM (PORCINE) 1000 UNIT/ML IJ SOLN
INTRAMUSCULAR | Status: DC | PRN
  Administered 2019-02-25: 5000 [IU] via INTRAVENOUS

## 2019-02-25 MED ORDER — VERAPAMIL HCL 2.5 MG/ML IV SOLN
INTRAVENOUS | Status: AC
  Filled 2019-02-25: qty 2

## 2019-02-25 MED ORDER — IOHEXOL 350 MG/ML SOLN
INTRAVENOUS | Status: DC | PRN
  Administered 2019-02-25: 45 mL

## 2019-02-25 MED ORDER — HYDRALAZINE HCL 20 MG/ML IJ SOLN: 10.0000 mg | INTRAMUSCULAR | Status: DC | PRN

## 2019-02-25 MED ORDER — ACETAMINOPHEN 325 MG PO TABS: 650.0000 mg | ORAL_TABLET | ORAL | Status: DC | PRN

## 2019-02-25 MED ORDER — SODIUM CHLORIDE 0.9 % IV SOLN: 250.0000 mL | INTRAVENOUS | Status: DC | PRN

## 2019-02-25 MED ORDER — MIDAZOLAM HCL 2 MG/2ML IJ SOLN
INTRAMUSCULAR | Status: DC | PRN
  Administered 2019-02-25: 1 mg via INTRAVENOUS

## 2019-02-25 MED ORDER — FENTANYL CITRATE (PF) 100 MCG/2ML IJ SOLN
INTRAMUSCULAR | Status: DC | PRN
  Administered 2019-02-25: 50 ug via INTRAVENOUS

## 2019-02-25 MED ORDER — SODIUM CHLORIDE 0.9% FLUSH: 3.0000 mL | Freq: Two times a day (BID) | INTRAVENOUS | Status: DC

## 2019-02-25 MED ORDER — ONDANSETRON HCL 4 MG/2ML IJ SOLN: 4.0000 mg | Freq: Four times a day (QID) | INTRAMUSCULAR | Status: DC | PRN

## 2019-02-25 MED ORDER — HEPARIN (PORCINE) IN NACL 1000-0.9 UT/500ML-% IV SOLN
INTRAVENOUS | Status: DC | PRN
  Administered 2019-02-25 (×2): 500 mL

## 2019-02-25 MED ORDER — SODIUM CHLORIDE 0.9 % IV SOLN: INTRAVENOUS | Status: DC

## 2019-02-25 MED ORDER — VERAPAMIL HCL 2.5 MG/ML IV SOLN
INTRAVENOUS | Status: DC | PRN
  Administered 2019-02-25: 10 mL via INTRA_ARTERIAL

## 2019-02-25 MED ORDER — ASPIRIN 81 MG PO CHEW: 81.0000 mg | CHEWABLE_TABLET | ORAL | Status: AC

## 2019-02-25 MED ORDER — LIDOCAINE HCL (PF) 1 % IJ SOLN
INTRAMUSCULAR | Status: AC
  Filled 2019-02-25: qty 30

## 2019-02-25 MED ORDER — MIDAZOLAM HCL 2 MG/2ML IJ SOLN
INTRAMUSCULAR | Status: AC
  Filled 2019-02-25: qty 2

## 2019-02-25 MED ORDER — SODIUM CHLORIDE 0.9 % WEIGHT BASED INFUSION: 1.0000 mL/kg/h | INTRAVENOUS | Status: DC

## 2019-02-25 MED ORDER — FENTANYL CITRATE (PF) 100 MCG/2ML IJ SOLN
INTRAMUSCULAR | Status: AC
  Filled 2019-02-25: qty 2

## 2019-02-25 MED ORDER — HEPARIN SODIUM (PORCINE) 1000 UNIT/ML IJ SOLN
INTRAMUSCULAR | Status: AC
  Filled 2019-02-25: qty 1

## 2019-02-25 SURGICAL SUPPLY — 9 items
CATH 5FR JL3.5 JR4 ANG PIG MP (CATHETERS) ×2 IMPLANT
DEVICE RAD COMP TR BAND LRG (VASCULAR PRODUCTS) ×2 IMPLANT
GLIDESHEATH SLEND SS 6F .021 (SHEATH) ×2 IMPLANT
GUIDEWIRE INQWIRE 1.5J.035X260 (WIRE) ×1 IMPLANT
INQWIRE 1.5J .035X260CM (WIRE) ×2
KIT HEART LEFT (KITS) ×2 IMPLANT
PACK CARDIAC CATHETERIZATION (CUSTOM PROCEDURE TRAY) ×2 IMPLANT
TRANSDUCER W/STOPCOCK (MISCELLANEOUS) ×2 IMPLANT
TUBING CIL FLEX 10 FLL-RA (TUBING) ×2 IMPLANT

## 2019-02-25 NOTE — Interval H&P Note (Signed)
History and Physical Interval Note:  02/25/2019 7:19 AM  Amber Reeves  has presented today for surgery, with the diagnosis of coronary artery disease with stable angina.  The various methods of treatment have been discussed with the patient and family. After consideration of risks, benefits and other options for treatment, the patient has consented to  Procedure(s): LEFT HEART CATH AND CORONARY ANGIOGRAPHY (N/A) as a surgical intervention.  The patient's history has been reviewed, patient examined, no change in status, stable for surgery.  I have reviewed the patient's chart and labs.  Questions were answered to the patient's satisfaction.    Cath Lab Visit (complete for each Cath Lab visit)  Clinical Evaluation Leading to the Procedure:   ACS: No.  Non-ACS:    Anginal Classification: CCS III  Anti-ischemic medical therapy: Maximal Therapy (2 or more classes of medications)  Non-Invasive Test Results: Low-risk stress test findings: cardiac mortality <1%/year  Prior CABG: No previous CABG   

## 2019-02-25 NOTE — Discharge Instructions (Signed)
Radial Site Care  This sheet gives you information about how to care for yourself after your procedure. Your health care provider may also give you more specific instructions. If you have problems or questions, contact your health care provider. What can I expect after the procedure? After the procedure, it is common to have:  Bruising and tenderness at the catheter insertion area. Follow these instructions at home: Medicines  Take over-the-counter and prescription medicines only as told by your health care provider. Insertion site care  Follow instructions from your health care provider about how to take care of your insertion site. Make sure you: ? Wash your hands with soap and water before you change your bandage (dressing). If soap and water are not available, use hand sanitizer. ? Change your dressing as told by your health care provider. ? Leave stitches (sutures), skin glue, or adhesive strips in place. These skin closures may need to stay in place for 2 weeks or longer. If adhesive strip edges start to loosen and curl up, you may trim the loose edges. Do not remove adhesive strips completely unless your health care provider tells you to do that.  Check your insertion site every day for signs of infection. Check for: ? Redness, swelling, or pain. ? Fluid or blood. ? Pus or a bad smell. ? Warmth.  Do not take baths, swim, or use a hot tub until your health care provider approves.  You may shower 24-48 hours after the procedure, or as directed by your health care provider. ? Remove the dressing and gently wash the site with plain soap and water. ? Pat the area dry with a clean towel. ? Do not rub the site. That could cause bleeding.  Do not apply powder or lotion to the site. Activity   For 24 hours after the procedure, or as directed by your health care provider: ? Do not flex or bend the affected arm. ? Do not push or pull heavy objects with the affected arm. ? Do not  drive yourself home from the hospital or clinic. You may drive 24 hours after the procedure unless your health care provider tells you not to. ? Do not operate machinery or power tools.  Do not lift anything that is heavier than 10 lb (4.5 kg), or the limit that you are told, until your health care provider says that it is safe.  Ask your health care provider when it is okay to: ? Return to work or school. ? Resume usual physical activities or sports. ? Resume sexual activity. General instructions  If the catheter site starts to bleed, raise your arm and put firm pressure on the site. If the bleeding does not stop, get help right away. This is a medical emergency.  If you went home on the same day as your procedure, a responsible adult should be with you for the first 24 hours after you arrive home.  Keep all follow-up visits as told by your health care provider. This is important. Contact a health care provider if:  You have a fever.  You have redness, swelling, or yellow drainage around your insertion site. Get help right away if:  You have unusual pain at the radial site.  The catheter insertion area swells very fast.  The insertion area is bleeding, and the bleeding does not stop when you hold steady pressure on the area.  Your arm or hand becomes pale, cool, tingly, or numb. These symptoms may represent a serious problem   that is an emergency. Do not wait to see if the symptoms will go away. Get medical help right away. Call your local emergency services (911 in the [place] Do not drive yourself to the hospital. Summary  After the procedure, it is common to have bruising and tenderness at the site.  Follow instructions from your health care provider about how to take care of your radial site wound. Check the wound every day for signs of infection.  Do not lift anything that is heavier than 10 lb (4.5 kg), or the limit that you are told, until your health care provider says  that it is safe. This information is not intended to replace advice given to you by your health care provider. Make sure you discuss any questions you have with your health care provider. Document Revised: 03/15/2017 Document Reviewed: 03/15/2017 Elsevier Patient Education  2020 [employer]  

## 2019-02-25 NOTE — Progress Notes (Signed)
Discharge instructions reviewed with patient and family. Verbalized understnading.

## 2019-03-12 ENCOUNTER — Encounter: Payer: Self-pay | Admitting: Cardiology

## 2019-03-12 ENCOUNTER — Ambulatory Visit: Payer: Medicare HMO | Admitting: Cardiology

## 2019-03-12 VITALS — BP 122/75 | HR 59 | Ht 63.0 in | Wt 215.0 lb

## 2019-03-12 DIAGNOSIS — E782 Mixed hyperlipidemia: Secondary | ICD-10-CM | POA: Diagnosis not present

## 2019-03-12 DIAGNOSIS — I10 Essential (primary) hypertension: Secondary | ICD-10-CM | POA: Diagnosis not present

## 2019-03-12 DIAGNOSIS — I2510 Atherosclerotic heart disease of native coronary artery without angina pectoris: Secondary | ICD-10-CM | POA: Diagnosis not present

## 2019-03-12 MED ORDER — ISOSORBIDE MONONITRATE ER 60 MG PO TB24: 60.0000 mg | ORAL_TABLET | Freq: Every day | ORAL | 3 refills | Status: DC

## 2019-03-12 NOTE — Progress Notes (Signed)
Clinical Summary Amber Reeves is a 69 y.o.female seen today for follow up of the following medical problems.   1. CAD -11/2017 cath at [facility] showed >70% mid LAD, otherwise patent vessels. She presented with chest pain, not an MI - received DES to LAD - 11/2017 echo  LVEF Complete echocardiogram shows a left ventricular EF by visual approximation 60 to 65%. Normal left ventricular systolic function. Grade 1 left ventricular diastolic function. No significant valvular disease. No pericardial effusion  01/2018 stress test: THR not achieved, exercise limiting angina , [facility] treadmill score neg 1  - she reports prior false negative stress tests in the past.   - 09/04/18 nuclear stress by prior cardiollogist showed anterior breast artifact, probably normal,   - due to ongoing chest pains last visit we referred for cath.  Jan 2021 cath: mild nonobstructive disease.   - chest pains improved but not resolved.     2. HTN - renal artery angio at time of 11/2017 cath showed no significant renal artery disease - compliant with meds   3. Hyperlipidemia - remains compliant withs tatin  4. OSA on cpap Past Medical History:  Diagnosis Date  . Allergy   . Anxiety   . Arthritis   . Asthma   . Depression   . Diabetes mellitus without complication ([place])   . GERD (gastroesophageal reflux disease)   . Hyperlipidemia   . Hypertension      Allergies  Allergen Reactions  . Penicillins Itching and Rash    Did it involve swelling of the face/tongue/throat, SOB, or low BP? No Did it involve sudden or severe rash/hives, skin peeling, or any reaction on the inside of your mouth or nose? No Did you need to seek medical attention at a hospital or doctor's office? No When did it last happen?~25 years ago If all above answers are "NO", may proceed with cephalosporin use.   . No Healthtouch Food Allergies     Honey-Rash Artificial Sweeteners-rash  . Paxil [Paroxetine  Hcl] Other (See Comments)    Twitching/feels like skin crawling  . Prozac [Fluoxetine Hcl] Other (See Comments)    Twitching/feels like skin crawling  . Zoloft [Sertraline Hcl] Other (See Comments)    Twitching/feels like skin crawling     Current Outpatient Medications  Medication Sig Dispense Refill  . albuterol (VENTOLIN HFA) 108 (90 Base) MCG/ACT inhaler INHALE 2 PUFFS BY MOUTH EVERY 4 HOURS AS NEEDED FOR WHEEZE (Patient taking differently: Inhale 2 puffs into the lungs every 4 (four) hours as needed for wheezing. ) 18 g 3  . Alcohol Swabs (B-D SINGLE USE SWABS REGULAR) PADS Apply topically.    Marland Kitchen aspirin EC 81 MG tablet Take 81 mg by mouth daily.     Marland Kitchen atorvastatin (LIPITOR) 40 MG tablet Take 1 tablet (40 mg total) by mouth daily at 6 PM. 90 tablet 3  . Blood Glucose Calibration (ACCU-CHEK AVIVA) SOLN 2 Bottles by Other route as needed.    . clopidogrel (PLAVIX) 75 MG tablet Take 1 tablet (75 mg total) by mouth daily. 90 tablet 1  . diphenhydrAMINE (BENADRYL) 25 MG tablet Take 25 mg by mouth every 6 (six) hours as needed for allergies.    Marland Kitchen docusate sodium (COLACE) 100 MG capsule Take 100 mg by mouth 2 (two) times daily as needed (constipation.).     Marland Kitchen fluticasone (FLONASE) 50 MCG/ACT nasal spray Place 2 sprays into both nostrils daily. (Patient taking differently: Place 1 spray into both  nostrils daily. ) 48 g 3  . glucose blood test strip TEST BLOOD SUGAR TWICE DAILY    . insulin NPH-regular Human (70-30) 100 UNIT/ML injection Inject 45 Units into the skin 2 (two) times daily.     . Insulin Syringe-Needle U-100 31G X 5/16" 0.5 ML MISC Use to inject Insulin four times daily: Use as directed; Dx: E11.9, E66.9    . ipratropium-albuterol (DUONEB) 0.5-2.5 (3) MG/3ML SOLN TAKE 3 ML (1 VIAL) BY NEBULIZATION EVERY 6 HOURS AS NEEDED FOR WHEEZING. (Patient taking differently: Inhale 3 mLs into the lungs every 6 (six) hours as needed (wheezing.). ) 360 mL 0  . isosorbide mononitrate (IMDUR) 30 MG  24 hr tablet Take 1.5 tablets (45 mg total) by mouth daily. 135 tablet 3  . lansoprazole (PREVACID) 30 MG capsule Take 30 mg by mouth at bedtime.     Marland Kitchen lisinopril (ZESTRIL) 40 MG tablet Take 1 tablet (40 mg total) by mouth daily. 90 tablet 3  . magnesium oxide (MAG-OX) 400 (241.3 Mg) MG tablet Take 400 mg by mouth daily.     . metoprolol tartrate (LOPRESSOR) 25 MG tablet Take 1 tablet (25 mg total) by mouth 2 (two) times daily. 180 tablet 3  . montelukast (SINGULAIR) 10 MG tablet TAKE 1 TABLET EVERY NIGHT (Patient taking differently: Take 10 mg by mouth at bedtime. ) 90 tablet 3  . pioglitazone (ACTOS) 30 MG tablet Take 1 tablet (30 mg total) by mouth daily. 90 tablet 0  . potassium chloride (KLOR-CON) 10 MEQ tablet Take 10 mEq by mouth every evening.      Current Facility-Administered Medications  Medication Dose Route Frequency Provider Last Rate Last Admin  . sodium chloride flush (NS) 0.9 % injection 3 mL  3 mL Intravenous Q12H , Alphonse Guild, MD         Past Surgical History:  Procedure Laterality Date  . ABDOMINAL HYSTERECTOMY    . CESAREAN SECTION     3  . CHOLECYSTECTOMY  2018  . CORONARY STENT PLACEMENT  10/2017   [facility])  . LEFT HEART CATH AND CORONARY ANGIOGRAPHY N/A 02/25/2019   Procedure: LEFT HEART CATH AND CORONARY ANGIOGRAPHY;  Surgeon: Nelva Bush, MD;  Location: [place] [facility];  Service: Cardiovascular;  Laterality: N/A;  . REPLACEMENT TOTAL KNEE BILATERAL Bilateral    done in [place] (2003/2005)     Allergies  Allergen Reactions  . Penicillins Itching and Rash    Did it involve swelling of the face/tongue/throat, SOB, or low BP? No Did it involve sudden or severe rash/hives, skin peeling, or any reaction on the inside of your mouth or nose? No Did you need to seek medical attention at a hospital or doctor's office? No When did it last happen?~25 years ago If all above answers are "NO", may proceed with cephalosporin  use.   . No Healthtouch Food Allergies     Honey-Rash Artificial Sweeteners-rash  . Paxil [Paroxetine Hcl] Other (See Comments)    Twitching/feels like skin crawling  . Prozac [Fluoxetine Hcl] Other (See Comments)    Twitching/feels like skin crawling  . Zoloft [Sertraline Hcl] Other (See Comments)    Twitching/feels like skin crawling      Family History  Problem Relation Age of Onset  . Heart disease Mother   . Stroke Mother   . Cancer Father   . Stroke Father      Social History Amber Reeves reports that she has never smoked. She has never used  smokeless tobacco. Amber Reeves reports no history of alcohol use.   Review of Systems CONSTITUTIONAL: No weight loss, fever, chills, weakness or fatigue.  HEENT: Eyes: No visual loss, blurred vision, double vision or yellow sclerae.No hearing loss, sneezing, congestion, runny nose or sore throat.  SKIN: No rash or itching.  CARDIOVASCULAR: per hpi RESPIRATORY: No shortness of breath, cough or sputum.  GASTROINTESTINAL: No anorexia, nausea, vomiting or diarrhea. No abdominal pain or blood.  GENITOURINARY: No burning on urination, no polyuria NEUROLOGICAL: No headache, dizziness, syncope, paralysis, ataxia, numbness or tingling in the extremities. No change in bowel or bladder control.  MUSCULOSKELETAL: No muscle, back pain, joint pain or stiffness.  LYMPHATICS: No enlarged nodes. No history of splenectomy.  PSYCHIATRIC: No history of depression or anxiety.  ENDOCRINOLOGIC: No reports of sweating, cold or heat intolerance. No polyuria or polydipsia.  Marland Kitchen   Physical Examination Today's Vitals   03/12/19 1128  BP: 122/75  Pulse: (!) 59  SpO2: 97%  Weight: 215 lb (97.5 kg)  Height: 5' 3"  (1.6 m)   Body mass index is 38.09 kg/m.  Gen: resting comfortably, no acute distress HEENT: no scleral icterus, pupils equal round and reactive, no palptable cervical adenopathy,  CV: RRR, no m/r/g, no jvd Resp: Clear to auscultation  bilaterally GI: abdomen is soft, non-tender, non-distended, normal bowel sounds, no hepatosplenomegaly MSK: extremities are warm, no edema.  Skin: warm, no rash Neuro:  no focal deficits Psych: appropriate affect   Diagnostic Studies  11/2017 cath [facility] Severe >70% long mid LAD stenosis No other significant angiographic stenosis in the other coronary arteries  DES to mid LAD   Malignant hypertension - selective renal angio without any evidence of  renal artery stenosis.   Renal angio procedure:  A Tiger 4 catheter was used to selectively engage both the left and right  renal arteries and was used to perform selective angiography.   Renal artery anatomy:  The left kidney is supplied by a single renal artery which has no  angiographic stenosis  The right kidney is supplied by a single rena artery which has no  angiographic stenosis   Jan 2021 1. Mild, non-obstructive coronary artery disease. 2. Widely patent proximal/mid LAD stent. 3. Normal left ventricular systolic function with upper normal to mildly elevated filling pressure (LVEDP ~15 mmHg).   Assessment and Plan   1. CAD - prior history as reported - recent symptoms similar to her prior chest pains before stent. 08/2018 stress test with pcp with breast attenuatin, no clear ischemia. - Jan 2021 cath shows no significant disease - continue medical therapy  2. HTN - bp at goal today, continue current meds  3. Hyperlipidemia  - she will continue statin     Arnoldo Lenis, M.D.

## 2019-03-12 NOTE — Patient Instructions (Signed)
Medication Instructions:  INCREASE IMDUR TO 60 MG DAILY   Labwork: NONE  Testing/Procedures: NONE  Follow-Up: Your physician recommends that you schedule a follow-up appointment in: 4 MONTHS    Any Other Special Instructions Will Be Listed Below (If Applicable).     If you need a refill on your cardiac medications before your next appointment, please call your pharmacy.

## 2019-03-14 ENCOUNTER — Encounter: Payer: Self-pay | Admitting: Family Medicine

## 2019-03-14 DIAGNOSIS — E1165 Type 2 diabetes mellitus with hyperglycemia: Secondary | ICD-10-CM

## 2019-03-14 DIAGNOSIS — Z794 Long term (current) use of insulin: Secondary | ICD-10-CM

## 2019-03-14 MED ORDER — PIOGLITAZONE HCL 30 MG PO TABS: 30.0000 mg | ORAL_TABLET | Freq: Every day | ORAL | 0 refills | Status: DC

## 2019-03-14 MED ORDER — POTASSIUM CHLORIDE CRYS ER 10 MEQ PO TBCR: 10.0000 meq | EXTENDED_RELEASE_TABLET | Freq: Every evening | ORAL | 0 refills | Status: DC

## 2019-03-14 NOTE — Telephone Encounter (Signed)
Please review

## 2019-04-06 ENCOUNTER — Other Ambulatory Visit: Payer: Self-pay | Admitting: Family Medicine

## 2019-04-26 ENCOUNTER — Other Ambulatory Visit: Payer: Self-pay

## 2019-04-29 ENCOUNTER — Encounter: Payer: Self-pay | Admitting: Family Medicine

## 2019-04-29 ENCOUNTER — Ambulatory Visit (INDEPENDENT_AMBULATORY_CARE_PROVIDER_SITE_OTHER): Payer: Medicare HMO | Admitting: Family Medicine

## 2019-04-29 ENCOUNTER — Other Ambulatory Visit: Payer: Self-pay

## 2019-04-29 VITALS — BP 138/71 | HR 56 | Temp 98.0°F | Ht 63.0 in | Wt 222.0 lb

## 2019-04-29 DIAGNOSIS — Z794 Long term (current) use of insulin: Secondary | ICD-10-CM | POA: Diagnosis not present

## 2019-04-29 DIAGNOSIS — R0789 Other chest pain: Secondary | ICD-10-CM

## 2019-04-29 DIAGNOSIS — E1165 Type 2 diabetes mellitus with hyperglycemia: Secondary | ICD-10-CM

## 2019-04-29 LAB — BAYER DCA HB A1C WAIVED: HB A1C (BAYER DCA - WAIVED): 7.9 % — ABNORMAL HIGH (ref <7.0)

## 2019-04-29 NOTE — Patient Instructions (Signed)

## 2019-04-29 NOTE — Progress Notes (Signed)
Subjective: CC: DM2, HTN, HLD HPI: Amber Reeves is a 69 y.o. female presenting to clinic today for:  1.  Type 2 diabetes with hypertension and hyperlipidemia History: No history of hospitalizations for diabetes/DKA.  No history of retinopathy or diabetic foot ulcers  Actos was increased to 30 mg daily given uncontrolled blood sugars and noncompliance with insulin regimen.  She has since started going back on her insulin but notes that if her blood sugars are in the 150s she will not inject 45 units because she typically does not hypoglycemic episodes as result.  She did advise 1 recent episode when she was coming home from [place] where she started feeling strangely.  She did not check her blood sugars to see if she is going hypoglycemic but notes that the episode was resolved by eating before races and a 16 ounce of Pepsi and resting.  She is compliant with her metoprolol 25 mg twice daily.  She recently saw cardiology who discontinued the Plavix and increased her Imdur.  She thankfully has had resolution of the chest pain she is experiencing previously.  Cardiac cath did show some mild obstructions but nothing that required intervention.  Last eye exam: UTD Last foot exam: utd Last A1c:  Lab Results  Component Value Date   HGBA1C 7.8 (H) 01/29/2019   Nephropathy screen indicated?: on ACE-I Last flu, zoster and/or pneumovax:  Immunization History  Administered Date(s) Administered  . Influenza, High Dose Seasonal PF 02/16/2017  . Influenza,inj,Quad PF,6+ Mos 12/18/2013    ROS: Denies dizziness, LOC,  foot ulcerations, numbness or tingling in extremities, shortness of breath or chest pain.   Past Medical History:  Diagnosis Date  . Allergy   . Anxiety   . Arthritis   . Asthma   . Depression   . Diabetes mellitus without complication ([place])   . GERD (gastroesophageal reflux disease)   . Hyperlipidemia   . Hypertension    Past Surgical History:  Procedure Laterality  Date  . ABDOMINAL HYSTERECTOMY    . CESAREAN SECTION     3  . CHOLECYSTECTOMY  2018  . CORONARY STENT PLACEMENT  10/2017   [facility])  . LEFT HEART CATH AND CORONARY ANGIOGRAPHY N/A 02/25/2019   Procedure: LEFT HEART CATH AND CORONARY ANGIOGRAPHY;  Surgeon: Nelva Bush, MD;  Location: [place] [facility];  Service: Cardiovascular;  Laterality: N/A;  . REPLACEMENT TOTAL KNEE BILATERAL Bilateral    done in [place] (2003/2005)   Social History   Socioeconomic History  . Marital status: Married    Spouse name: Not on file  . Number of children: Not on file  . Years of education: Not on file  . Highest education level: Not on file  Occupational History  . Not on file  Tobacco Use  . Smoking status: Never Smoker  . Smokeless tobacco: Never Used  Substance and Sexual Activity  . Alcohol use: Never  . Drug use: Never  . Sexual activity: Not Currently  Other Topics Concern  . Not on file  Social History Narrative   Resides at home with her husband.  She is originally from [place] but relocated to [place] and then to [place] in August 2020.  She has a son who lives about 2 hours away in [place] and a sister-in-law who lives in [place].   Social Determinants of Health   Financial Resource Strain:   . Difficulty of Paying Living Expenses: Not on file  Food Insecurity:   .  Worried About Charity fundraiser in the Last Year: Not on file  . Ran Out of Food in the Last Year: Not on file  Transportation Needs:   . Lack of Transportation (Medical): Not on file  . Lack of Transportation (Non-Medical): Not on file  Physical Activity:   . Days of Exercise per Week: Not on file  . Minutes of Exercise per Session: Not on file  Stress:   . Feeling of Stress : Not on file  Social Connections:   . Frequency of Communication with Friends and Family: Not on file  . Frequency of Social Gatherings with Friends and Family: Not on file  . Attends Religious Services:  Not on file  . Active Member of Clubs or Organizations: Not on file  . Attends Archivist Meetings: Not on file  . Marital Status: Not on file  Intimate Partner Violence:   . Fear of Current or Ex-Partner: Not on file  . Emotionally Abused: Not on file  . Physically Abused: Not on file  . Sexually Abused: Not on file   No outpatient medications have been marked as taking for the 04/29/19 encounter (Office Visit) with Janora Norlander, DO.   Current Facility-Administered Medications for the 04/29/19 encounter (Office Visit) with Janora Norlander, DO  Medication  . sodium chloride flush (NS) 0.9 % injection 3 mL   Family History  Problem Relation Age of Onset  . Heart disease Mother   . Stroke Mother   . Cancer Father   . Stroke Father    Allergies  Allergen Reactions  . Penicillins Itching and Rash    Did it involve swelling of the face/tongue/throat, SOB, or low BP? No Did it involve sudden or severe rash/hives, skin peeling, or any reaction on the inside of your mouth or nose? No Did you need to seek medical attention at a hospital or doctor's office? No When did it last happen?~25 years ago If all above answers are "NO", may proceed with cephalosporin use.   . No Healthtouch Food Allergies     Honey-Rash Artificial Sweeteners-rash  . Paxil [Paroxetine Hcl] Other (See Comments)    Twitching/feels like skin crawling  . Prozac [Fluoxetine Hcl] Other (See Comments)    Twitching/feels like skin crawling  . Zoloft [Sertraline Hcl] Other (See Comments)    Twitching/feels like skin crawling     Health Maintenance: Flu, Prevnar  ROS: Per HPI  Objective: Office vital signs reviewed. BP 138/71   Pulse (!) 56   Temp 98 F (36.7 C) (Temporal)   Ht 5' 3"  (1.6 m)   Wt 222 lb (100.7 kg)   SpO2 99%   BMI 39.33 kg/m   Physical Examination:  General: Awake, alert, obese, No acute distress HEENT: Normal; sclera white.  Moist mucous membranes Cardio:  regular rate and rhythm, S1S2 heard, no murmurs appreciated Pulm: clear to auscultation bilaterally, no wheezes, rhonchi or rales; normal work of breathing on room air Extremities: warm, well perfused. MSK: Normal tone.  Ambulates independently Psych: Mood stable, speech somewhat tangential, affect appropriate. Depression screen [facility] 2/9 04/29/2019 01/29/2019  Decreased Interest 0 1  Down, Depressed, Hopeless 0 1  PHQ - 2 Score 0 2  Altered sleeping 0 -  Tired, decreased energy 0 1  Change in appetite 0 0  Feeling bad or failure about yourself  0 0  Trouble concentrating 0 0  Moving slowly or fidgety/restless 0 0  Suicidal thoughts - 0  PHQ-9 Score 0 -  Difficult doing work/chores - Not difficult at all    Assessment/ Plan: 69 y.o. female   1. Uncontrolled type 2 diabetes mellitus with hyperglycemia (HCC) A1c demonstrates uncontrolled diabetes at 7.9 today.  This is despite increased dose of Actos and reported compliance with insulin.  I do have a suspicion that there are various degrees of compliance with the insulin given transient blood pressures.  Given her financial constraints, it sounds like the mixed insulin is going to be in her best interest at this time.  I am placing a referral to endocrinology to see if perhaps they might be able to assist with adjustment of her insulin regimen.  May consider coming off of Actos completely and totally relying on the insulin at this point since the increased dose seems to be of little help at this time.  She declined Prevnar vaccine.  I counseled her on Covid vaccination and given her history of intolerance/allergic reactions to various things encouraged her to get the [place] which is preservative-free. - Bayer DCA Hb A1c Waived - Ambulatory referral to Endocrinology - Basic Metabolic Panel  2. Atypical chest pain Resolved since increasing the dose of Imdur.  She will continue to follow-up with her cardiologist as needed as directed.   Janora Norlander, DO [place] [phone]

## 2019-04-30 LAB — BASIC METABOLIC PANEL
BUN/Creatinine Ratio: 16 (ref 12–28)
BUN: 16 mg/dL (ref 8–27)
CO2: 24 mmol/L (ref 20–29)
Calcium: 9.1 mg/dL (ref 8.7–10.3)
Chloride: 102 mmol/L (ref 96–106)
Creatinine, Ser: 0.97 mg/dL (ref 0.57–1.00)
GFR calc Af Amer: 69 mL/min/{1.73_m2} (ref >59)
GFR calc non Af Amer: 60 mL/min/{1.73_m2} (ref >59)
Glucose: 258 mg/dL — ABNORMAL HIGH (ref 65–99)
Potassium: 4.9 mmol/L (ref 3.5–5.2)
Sodium: 139 mmol/L (ref 134–144)

## 2019-05-10 ENCOUNTER — Other Ambulatory Visit: Payer: Self-pay | Admitting: Family Medicine

## 2019-05-20 ENCOUNTER — Ambulatory Visit: Payer: Medicare HMO | Admitting: "Endocrinology

## 2019-05-20 ENCOUNTER — Other Ambulatory Visit: Payer: Self-pay

## 2019-05-20 ENCOUNTER — Encounter: Payer: Self-pay | Admitting: "Endocrinology

## 2019-05-20 VITALS — BP 116/68 | HR 55 | Ht 63.0 in | Wt 222.8 lb

## 2019-05-20 DIAGNOSIS — I10 Essential (primary) hypertension: Secondary | ICD-10-CM | POA: Insufficient documentation

## 2019-05-20 DIAGNOSIS — E1159 Type 2 diabetes mellitus with other circulatory complications: Secondary | ICD-10-CM

## 2019-05-20 DIAGNOSIS — E782 Mixed hyperlipidemia: Secondary | ICD-10-CM | POA: Diagnosis not present

## 2019-05-20 DIAGNOSIS — E119 Type 2 diabetes mellitus without complications: Secondary | ICD-10-CM | POA: Insufficient documentation

## 2019-05-20 NOTE — Progress Notes (Signed)
Endocrinology Consult Note       05/20/2019, 7:58 PM   Subjective:    Patient ID: Amber Reeves, female    DOB: 06-24-1950.  Amber Reeves is being seen in consultation for management of currently uncontrolled symptomatic diabetes requested by  Janora Norlander, DO.   Past Medical History:  Diagnosis Date  . Allergy   . Anxiety   . Arthritis   . Asthma   . Depression   . Diabetes mellitus without complication ([place])   . GERD (gastroesophageal reflux disease)   . Hyperlipidemia   . Hypertension     Past Surgical History:  Procedure Laterality Date  . ABDOMINAL HYSTERECTOMY    . CESAREAN SECTION     3  . CHOLECYSTECTOMY  2018  . CORONARY STENT PLACEMENT  10/2017   [facility])  . LEFT HEART CATH AND CORONARY ANGIOGRAPHY N/A 02/25/2019   Procedure: LEFT HEART CATH AND CORONARY ANGIOGRAPHY;  Surgeon: Nelva Bush, MD;  Location: [place] [facility];  Service: Cardiovascular;  Laterality: N/A;  . REPLACEMENT TOTAL KNEE BILATERAL Bilateral    done in [place] (2003/2005)    Social History   Socioeconomic History  . Marital status: Married    Spouse name: Not on file  . Number of children: Not on file  . Years of education: Not on file  . Highest education level: Not on file  Occupational History  . Not on file  Tobacco Use  . Smoking status: Never Smoker  . Smokeless tobacco: Never Used  Substance and Sexual Activity  . Alcohol use: Never  . Drug use: Never  . Sexual activity: Not Currently  Other Topics Concern  . Not on file  Social History Narrative   Resides at home with her husband.  She is originally from [place] but relocated to [place] and then to [place] in August 2020.  She has a son who lives about 2 hours away in [place] and a sister-in-law who lives in [place].   Social Determinants of Health   Financial Resource Strain:   .  Difficulty of Paying Living Expenses:   Food Insecurity:   . Worried About Charity fundraiser in the Last Year:   . Arboriculturist in the Last Year:   Transportation Needs:   . Film/video editor (Medical):   Marland Kitchen Lack of Transportation (Non-Medical):   Physical Activity:   . Days of Exercise per Week:   . Minutes of Exercise per Session:   Stress:   . Feeling of Stress :   Social Connections:   . Frequency of Communication with Friends and Family:   . Frequency of Social Gatherings with Friends and Family:   . Attends Religious Services:   . Active Member of Clubs or Organizations:   . Attends Archivist Meetings:   Marland Kitchen Marital Status:     Family History  Problem Relation Age of Onset  . Heart disease Mother   . Stroke Mother   . Hypertension Mother   . Cancer Father   . Stroke Father     Outpatient Encounter Medications  as of 05/20/2019  Medication Sig  . furosemide (LASIX) 20 MG tablet Take 20 mg by mouth daily as needed.  Marland Kitchen albuterol (VENTOLIN HFA) 108 (90 Base) MCG/ACT inhaler INHALE 2 PUFFS BY MOUTH EVERY 4 HOURS AS NEEDED FOR WHEEZE (Patient taking differently: Inhale 2 puffs into the lungs every 4 (four) hours as needed for wheezing. )  . Alcohol Swabs (B-D SINGLE USE SWABS REGULAR) PADS Apply topically.  Marland Kitchen aspirin EC 81 MG tablet Take 81 mg by mouth daily.   Marland Kitchen atorvastatin (LIPITOR) 40 MG tablet Take 1 tablet (40 mg total) by mouth daily at 6 PM.  . Blood Glucose Calibration (ACCU-CHEK AVIVA) SOLN 2 Bottles by Other route as needed.  . diphenhydrAMINE (BENADRYL) 25 MG tablet Take 25 mg by mouth every 6 (six) hours as needed for allergies.  Marland Kitchen docusate sodium (COLACE) 100 MG capsule Take 100 mg by mouth 2 (two) times daily as needed (constipation.).   Marland Kitchen fluticasone (FLONASE) 50 MCG/ACT nasal spray Place 2 sprays into both nostrils daily. (Patient taking differently: Place 1 spray into both nostrils daily. )  . glucose blood test strip TEST BLOOD SUGAR TWICE  DAILY  . insulin NPH-regular Human (70-30) 100 UNIT/ML injection Inject 20 Units into the skin 2 (two) times daily before a meal.  . Insulin Syringe-Needle U-100 31G X 5/16" 0.5 ML MISC Use to inject Insulin four times daily: Use as directed; Dx: E11.9, E66.9  . ipratropium-albuterol (DUONEB) 0.5-2.5 (3) MG/3ML SOLN TAKE 3 ML (1 VIAL) BY NEBULIZATION EVERY 6 HOURS AS NEEDED FOR WHEEZING. (Patient taking differently: Inhale 3 mLs into the lungs every 6 (six) hours as needed (wheezing.). )  . isosorbide mononitrate (IMDUR) 60 MG 24 hr tablet Take 1 tablet (60 mg total) by mouth daily.  Marland Kitchen KLOR-CON M10 10 MEQ tablet TAKE 1 TABLET (10 MEQ TOTAL) BY MOUTH EVERY EVENING.  Marland Kitchen lansoprazole (PREVACID) 30 MG capsule Take 30 mg by mouth at bedtime.   Marland Kitchen lisinopril (ZESTRIL) 40 MG tablet Take 1 tablet (40 mg total) by mouth daily.  . magnesium oxide (MAG-OX) 400 (241.3 Mg) MG tablet Take 400 mg by mouth daily.   . metoprolol tartrate (LOPRESSOR) 25 MG tablet Take 1 tablet (25 mg total) by mouth 2 (two) times daily.  . montelukast (SINGULAIR) 10 MG tablet TAKE 1 TABLET EVERY NIGHT (Patient taking differently: Take 10 mg by mouth at bedtime. )  . pioglitazone (ACTOS) 30 MG tablet Take 1 tablet (30 mg total) by mouth daily.   Facility-Administered Encounter Medications as of 05/20/2019  Medication  . sodium chloride flush (NS) 0.9 % injection 3 mL    ALLERGIES: Allergies  Allergen Reactions  . Penicillins Itching and Rash    Did it involve swelling of the face/tongue/throat, SOB, or low BP? No Did it involve sudden or severe rash/hives, skin peeling, or any reaction on the inside of your mouth or nose? No Did you need to seek medical attention at a hospital or doctor's office? No When did it last happen?~25 years ago If all above answers are "NO", may proceed with cephalosporin use.   . No Healthtouch Food Allergies     Honey-Rash Artificial Sweeteners-rash  . Paxil [Paroxetine Hcl] Other (See  Comments)    Twitching/feels like skin crawling  . Prozac [Fluoxetine Hcl] Other (See Comments)    Twitching/feels like skin crawling  . Zoloft [Sertraline Hcl] Other (See Comments)    Twitching/feels like skin crawling    VACCINATION STATUS: Immunization History  Administered  Date(s) Administered  . Influenza, High Dose Seasonal PF 02/16/2017  . Influenza,inj,Quad PF,6+ Mos 12/18/2013    Diabetes She presents for her initial diabetic visit. She has type 2 diabetes mellitus. Onset time: She was diagnosed at approximate age of 67 years. Her disease course has been worsening. Hypoglycemia symptoms include headaches and sweats. Pertinent negatives for hypoglycemia include no confusion, pallor or seizures. Associated symptoms include fatigue, polydipsia and polyuria. Pertinent negatives for diabetes include no chest pain and no polyphagia. There are no hypoglycemic complications. Symptoms are worsening. Diabetic complications include heart disease. Risk factors for coronary artery disease include dyslipidemia, diabetes mellitus, family history, hypertension, obesity, sedentary lifestyle and post-menopausal. Current diabetic treatment includes insulin injections and oral agent (monotherapy). Her weight is increasing steadily. She is following a generally unhealthy diet. When asked about meal planning, she reported none. She has not had a previous visit with a dietitian. She rarely participates in exercise. Her home blood glucose trend is increasing steadily. (She did not bring any logs nor meter to review.  Her most recent A1c was found to .9%.  She is currently on Humulin 70/30 45 units twice daily which she says is causing problems with hypoglycemia.) An ACE inhibitor/angiotensin II receptor blocker is being taken.  Hypertension This is a chronic problem. The current episode started more than 1 year ago. The problem is controlled. Associated symptoms include headaches and sweats. Pertinent negatives  include no chest pain, palpitations or shortness of breath. Risk factors for coronary artery disease include sedentary lifestyle, obesity, post-menopausal state, family history, dyslipidemia and diabetes mellitus. Past treatments include ACE inhibitors and beta blockers. Hypertensive end-organ damage includes CAD/MI.  Hyperlipidemia This is a chronic problem. The current episode started more than 1 year ago. Exacerbating diseases include diabetes and obesity. Pertinent negatives include no chest pain, myalgias or shortness of breath. Risk factors for coronary artery disease include diabetes mellitus, dyslipidemia, family history, obesity, hypertension, post-menopausal and a sedentary lifestyle.     Review of Systems  Constitutional: Positive for fatigue. Negative for chills, fever and unexpected weight change.  HENT: Negative for trouble swallowing and voice change.   Eyes: Negative for visual disturbance.  Respiratory: Negative for cough, shortness of breath and wheezing.   Cardiovascular: Negative for chest pain, palpitations and leg swelling.  Gastrointestinal: Negative for diarrhea, nausea and vomiting.  Endocrine: Positive for polydipsia and polyuria. Negative for cold intolerance, heat intolerance and polyphagia.  Musculoskeletal: Negative for arthralgias and myalgias.  Skin: Negative for color change, pallor, rash and wound.  Neurological: Positive for headaches. Negative for seizures.  Psychiatric/Behavioral: Negative for confusion and suicidal ideas.    Objective:    Vitals with BMI 05/20/2019 04/29/2019 04/29/2019  Height 5' 3"  - 5' 3"   Weight 222 lbs 13 oz - 222 lbs  BMI 28.76 - 81.15  Systolic 726 203 559  Diastolic 68 71 70  Pulse 55 - 56    BP 116/68   Pulse (!) 55   Ht 5' 3"  (1.6 m)   Wt 222 lb 12.8 oz (101.1 kg)   BMI 39.47 kg/m   Wt Readings from Last 3 Encounters:  05/20/19 222 lb 12.8 oz (101.1 kg)  04/29/19 222 lb (100.7 kg)  03/12/19 215 lb (97.5 kg)      Physical Exam Constitutional:      Appearance: She is well-developed.  HENT:     Head: Normocephalic and atraumatic.  Neck:     Thyroid: No thyromegaly.     Trachea: No tracheal  deviation.  Cardiovascular:     Rate and Rhythm: Normal rate and regular rhythm.  Pulmonary:     Effort: Pulmonary effort is normal.  Abdominal:     Tenderness: There is no abdominal tenderness. There is no guarding.  Musculoskeletal:        General: Normal range of motion.     Cervical back: Normal range of motion and neck supple.  Skin:    General: Skin is warm and dry.     Coloration: Skin is not pale.     Findings: No erythema or rash.  Neurological:     Mental Status: She is alert and oriented to person, place, and time.     Cranial Nerves: No cranial nerve deficit.     Coordination: Coordination normal.     Deep Tendon Reflexes: Reflexes are normal and symmetric.  Psychiatric:        Judgment: Judgment normal.      CMP ( most recent) CMP     Component Value Date/Time   NA 139 04/29/2019 1625   K 4.9 04/29/2019 1625   CL 102 04/29/2019 1625   CO2 24 04/29/2019 1625   GLUCOSE 258 (H) 04/29/2019 1625   GLUCOSE 167 (H) 02/20/2019 0917   BUN 16 04/29/2019 1625   CREATININE 0.97 04/29/2019 1625   CALCIUM 9.1 04/29/2019 1625   PROT 7.2 01/29/2019 1527   ALBUMIN 4.0 01/29/2019 1527   AST 17 01/29/2019 1527   ALT 16 01/29/2019 1527   ALKPHOS 67 01/29/2019 1527   BILITOT 0.6 01/29/2019 1527   GFRNONAA 60 04/29/2019 1625   GFRAA 69 04/29/2019 1625     Diabetic Labs (most recent): Lab Results  Component Value Date   HGBA1C 7.9 (H) 04/29/2019   HGBA1C 7.8 (H) 01/29/2019      Assessment & Plan:   1. DM type 2 causing vascular disease ([place])  - Amber Reeves has currently uncontrolled symptomatic type 2 DM since  69 years of age,  with most recent A1c of 7.9 %. Recent labs reviewed. - I had a long discussion with her about the progressive nature of diabetes and the pathology  behind its complications. -her diabetes is complicated by coronary artery disease which required stent placement in September 2019, obesity/sedentary life and she remains at a high risk for more acute and chronic complications which include CAD, CVA, CKD, retinopathy, and neuropathy. These are all discussed in detail with her.  - I have counseled her on diet  and weight management  by adopting a carbohydrate restricted/protein rich diet. Patient is encouraged to switch to  unprocessed or minimally processed     complex starch and increased protein intake (animal or plant source), fruits, and vegetables. -  she is advised to stick to a routine mealtimes to eat 3 meals  a day and avoid unnecessary snacks ( to snack only to correct hypoglycemia).   - she admits that there is a room for improvement in her food and drink choices. - Suggestion is made for her to avoid simple carbohydrates  from her diet including Cakes, Sweet Desserts, Ice Cream, Soda (diet and regular), Sweet Tea, Candies, Chips, Cookies, Store Bought Juices, Alcohol in Excess of  1-2 drinks a day, Artificial Sweeteners,  Coffee Creamer, and "Sugar-free" Products. This will help patient to have more stable blood glucose profile and potentially avoid unintended weight gain.  - she will be scheduled with Jearld Fenton, RDN, CDE for diabetes education.  - I have approached her with  the following individualized plan to manage  her diabetes and patient agrees:   - she will continue to require insulin treatment in order for her to achieve and maintain control of diabetes to target.  Due to cost reasons, she wishes to stay on her current insulin regimen Humulin 70/30.  I discussed and lowered her dose to Humulin 70/30 20 units twice daily with breakfast and with supper when Premeal blood glucose readings are above 90 mg per DL.  She is willing to continue to monitor blood glucose at least twice a day before breakfast and before supper and as  needed.    - she is warned not to take insulin without proper monitoring per orders. - Adjustment parameters are given to her for hypo and hyperglycemia in writing. - she is encouraged to call clinic for blood glucose levels less than 70 or above 300 mg /dl. - she is advised to continue pioglitazone 30 mg, therapeutically suitable for patient . -She did not tolerate Metformin. - she will benefit from and will be considered for incretin therapy as appropriate next visit.  - Specific targets for  A1c;  LDL, HDL,  and Triglycerides were discussed with the patient.  2) Blood Pressure /Hypertension:  her blood pressure is  controlled to target.   she is advised to continue her current medications including lisinopril 40 mg daily, metoprolol 25 mg p.o. twice daily.  3) Lipids/Hyperlipidemia: She does not have recent lipid panel to review.  She is currently on atorvastatin 40 mg p.o. nightly.  She is advised to continue.    4)  Weight/Diet:  Body mass index is 39.47 kg/m.  -   clearly complicating her diabetes care.   she is  a candidate for weight loss. I discussed with her the fact that loss of 5 - 10% of her  current body weight will have the most impact on her diabetes management.  Exercise, and detailed carbohydrates information provided  -  detailed on discharge instructions.  5) Chronic Care/Health Maintenance:  -she  is on ACEI/ARB and Statin medications and  is encouraged to initiate and continue to follow up with Ophthalmology, Dentist,  Podiatrist at least yearly or according to recommendations, and advised to   stay away from smoking. I have recommended yearly flu vaccine and pneumonia vaccine at least every 5 years; moderate intensity exercise for up to 150 minutes weekly; and  sleep for at least 7 hours a day.  - she is  advised to maintain close follow up with Janora Norlander, DO for primary care needs, as well as her other providers for optimal and coordinated care.   - Time  spent in this patient care: 60 min, of which > 50% was spent in  counseling  her about her currently uncontrolled, complicated type 2 diabetes; hyperlipidemia; hypertension and the rest reviewing her blood glucose logs , discussing her hypoglycemia and hyperglycemia episodes, reviewing her current and  previous labs / studies  ( including abstraction from other facilities) and medications  doses and developing a  long term treatment plan based on the latest standards of care/ guidelines; and documenting her care.    Please refer to Patient Instructions for Blood Glucose Monitoring and Insulin/Medications Dosing Guide"  in media tab for additional information. Please  also refer to " Patient Self Inventory" in the Media  tab for reviewed elements of pertinent patient history.  Amber Reeves participated in the discussions, expressed understanding, and voiced agreement with the above  plans.  All questions were answered to her satisfaction. she is encouraged to contact clinic should she have any questions or concerns prior to her return visit.   Follow up plan: - Return in about 9 weeks (around 07/22/2019) for Bring Meter and Logs- A1c in Office.  Glade Lloyd, MD [facility] [address] [place], [place] [address] Phone: [phone]  Fax: [phone]    05/20/2019, 7:58 PM  This note was partially dictated with voice recognition software. Similar sounding words can be transcribed inadequately or may not  be corrected upon review.

## 2019-05-20 NOTE — Patient Instructions (Signed)

## 2019-06-04 ENCOUNTER — Other Ambulatory Visit: Payer: Self-pay | Admitting: Family Medicine

## 2019-06-04 DIAGNOSIS — E1165 Type 2 diabetes mellitus with hyperglycemia: Secondary | ICD-10-CM

## 2019-06-04 DIAGNOSIS — Z794 Long term (current) use of insulin: Secondary | ICD-10-CM

## 2019-06-05 ENCOUNTER — Other Ambulatory Visit: Payer: Self-pay | Admitting: Family Medicine

## 2019-06-10 ENCOUNTER — Telehealth: Payer: Self-pay | Admitting: Family Medicine

## 2019-06-10 MED ORDER — MAGNESIUM OXIDE 400 (241.3 MG) MG PO TABS: 400.0000 mg | ORAL_TABLET | Freq: Every day | ORAL | 1 refills | Status: DC

## 2019-06-10 MED ORDER — ALBUTEROL SULFATE HFA 108 (90 BASE) MCG/ACT IN AERS: INHALATION_SPRAY | RESPIRATORY_TRACT | 1 refills | Status: DC

## 2019-06-10 NOTE — Telephone Encounter (Signed)
Patient had a recent apt - refills sent in as patient requested.

## 2019-06-10 NOTE — Telephone Encounter (Signed)
  Prescription Request  06/10/2019  What is the name of the medication or equipment? albuterol (VENTOLIN HFA) 108 (90 Base) MCG/ACT inhaler  magnesium oxide (MAG-OX) 400 (241.3 Mg) MG tablet   Have you contacted your pharmacy to request a refill? (if applicable) yes  Which pharmacy would you like this sent to? [facility]   Patient notified that their request is being sent to the clinical staff for review and that they should receive a response within 2 business days.

## 2019-07-04 ENCOUNTER — Ambulatory Visit: Payer: Medicare HMO | Admitting: Cardiology

## 2019-07-04 ENCOUNTER — Other Ambulatory Visit: Payer: Self-pay

## 2019-07-04 ENCOUNTER — Encounter: Payer: Self-pay | Admitting: Cardiology

## 2019-07-04 VITALS — BP 114/66 | HR 64 | Temp 98.6°F | Ht 63.0 in | Wt 228.0 lb

## 2019-07-04 DIAGNOSIS — I2510 Atherosclerotic heart disease of native coronary artery without angina pectoris: Secondary | ICD-10-CM | POA: Diagnosis not present

## 2019-07-04 DIAGNOSIS — E782 Mixed hyperlipidemia: Secondary | ICD-10-CM | POA: Diagnosis not present

## 2019-07-04 DIAGNOSIS — I10 Essential (primary) hypertension: Secondary | ICD-10-CM | POA: Diagnosis not present

## 2019-07-04 NOTE — Progress Notes (Signed)
Clinical Summary Amber Reeves is a 69 y.o.female seen today for follow up of the following medical problems.   1. CAD -11/2017 cath at [facility] showed >70% mid LAD, otherwise patent vessels. She presented with chest pain, not an MI - received DES to LAD - 11/2017 echo LVEF Complete echocardiogram shows a left ventricular EF by visual approximation 60 to 65%. Normal left ventricular systolic function. Grade 1 left ventricular diastolic function. No significant valvular disease. No pericardial effusion  01/2018 stress test: THR not achieved, exercise limiting angina , duke treadmill score neg 1  - she reports prior false negative stress tests in the past.   - 7/14/20nuclear stress by prior cardiollogist showedanterior breast artifact, probably normal,  - due to ongoing chest pains last visit we referred for cath.  Jan 2021 cath: mild nonobstructive disease.    - no recent chest pains, better with imdur - compliant with meds    2. HTN - renal artery angio at time of 11/2017 cath showed no significant renal artery disease -she is compliant with meds   3. Hyperlipidemia - she is compliant with statin.   4. OSA on cpap - she attributes getting pneumonia when on cpap, she stopped wearing.  - does not want to wear cpap   Past Medical History:  Diagnosis Date  . Allergy   . Anxiety   . Arthritis   . Asthma   . Depression   . Diabetes mellitus without complication ([place])   . GERD (gastroesophageal reflux disease)   . Hyperlipidemia   . Hypertension      Allergies  Allergen Reactions  . Penicillins Itching and Rash    Did it involve swelling of the face/tongue/throat, SOB, or low BP? No Did it involve sudden or severe rash/hives, skin peeling, or any reaction on the inside of your mouth or nose? No Did you need to seek medical attention at a hospital or doctor's office? No When did it last happen?~25 years ago If all above answers are  "NO", may proceed with cephalosporin use.   . No Healthtouch Food Allergies     Honey-Rash Artificial Sweeteners-rash  . Paxil [Paroxetine Hcl] Other (See Comments)    Twitching/feels like skin crawling  . Prozac [Fluoxetine Hcl] Other (See Comments)    Twitching/feels like skin crawling  . Zoloft [Sertraline Hcl] Other (See Comments)    Twitching/feels like skin crawling     Current Outpatient Medications  Medication Sig Dispense Refill  . albuterol (VENTOLIN HFA) 108 (90 Base) MCG/ACT inhaler INHALE 2 PUFFS BY MOUTH EVERY 4 HOURS AS NEEDED FOR WHEEZE 18 g 1  . Alcohol Swabs (B-D SINGLE USE SWABS REGULAR) PADS Apply topically.    Marland Kitchen aspirin EC 81 MG tablet Take 81 mg by mouth daily.     Marland Kitchen atorvastatin (LIPITOR) 40 MG tablet Take 1 tablet (40 mg total) by mouth daily at 6 PM. 90 tablet 3  . Blood Glucose Calibration (ACCU-CHEK AVIVA) SOLN 2 Bottles by Other route as needed.    . diphenhydrAMINE (BENADRYL) 25 MG tablet Take 25 mg by mouth every 6 (six) hours as needed for allergies.    Marland Kitchen docusate sodium (COLACE) 100 MG capsule Take 100 mg by mouth 2 (two) times daily as needed (constipation.).     Marland Kitchen fluticasone (FLONASE) 50 MCG/ACT nasal spray Place 2 sprays into both nostrils daily. (Patient taking differently: Place 1 spray into both nostrils daily. ) 48 g 3  . furosemide (LASIX) 20 MG  tablet Take 20 mg by mouth daily as needed.    Marland Kitchen glucose blood test strip TEST BLOOD SUGAR TWICE DAILY    . insulin NPH-regular Human (70-30) 100 UNIT/ML injection Inject 20 Units into the skin 2 (two) times daily before a meal.    . Insulin Syringe-Needle U-100 31G X 5/16" 0.5 ML MISC Use to inject Insulin four times daily: Use as directed; Dx: E11.9, E66.9    . ipratropium-albuterol (DUONEB) 0.5-2.5 (3) MG/3ML SOLN TAKE 3 ML (1 VIAL) BY NEBULIZATION EVERY 6 HOURS AS NEEDED FOR WHEEZING. (Patient taking differently: Inhale 3 mLs into the lungs every 6 (six) hours as needed (wheezing.). ) 360 mL 0  .  isosorbide mononitrate (IMDUR) 60 MG 24 hr tablet Take 1 tablet (60 mg total) by mouth daily. 90 tablet 3  . KLOR-CON M10 10 MEQ tablet TAKE 1 TABLET (10 MEQ TOTAL) BY MOUTH EVERY EVENING. 90 tablet 0  . lansoprazole (PREVACID) 30 MG capsule Take 30 mg by mouth at bedtime.     Marland Kitchen lisinopril (ZESTRIL) 40 MG tablet Take 1 tablet (40 mg total) by mouth daily. 90 tablet 3  . magnesium oxide (MAG-OX) 400 (241.3 Mg) MG tablet Take 1 tablet (400 mg total) by mouth daily. 90 tablet 1  . metoprolol tartrate (LOPRESSOR) 25 MG tablet Take 1 tablet (25 mg total) by mouth 2 (two) times daily. 180 tablet 3  . montelukast (SINGULAIR) 10 MG tablet TAKE 1 TABLET EVERY NIGHT (Patient taking differently: Take 10 mg by mouth at bedtime. ) 90 tablet 3  . pioglitazone (ACTOS) 30 MG tablet TAKE 1 TABLET BY MOUTH EVERY DAY 90 tablet 0   Current Facility-Administered Medications  Medication Dose Route Frequency Provider Last Rate Last Admin  . sodium chloride flush (NS) 0.9 % injection 3 mL  3 mL Intravenous Q12H , Alphonse Guild, MD         Past Surgical History:  Procedure Laterality Date  . ABDOMINAL HYSTERECTOMY    . CESAREAN SECTION     3  . CHOLECYSTECTOMY  2018  . CORONARY STENT PLACEMENT  10/2017   [facility])  . LEFT HEART CATH AND CORONARY ANGIOGRAPHY N/A 02/25/2019   Procedure: LEFT HEART CATH AND CORONARY ANGIOGRAPHY;  Surgeon: Nelva Bush, MD;  Location: [facility];  Service: Cardiovascular;  Laterality: N/A;  . REPLACEMENT TOTAL KNEE BILATERAL Bilateral    done in [place] (2003/2005)     Allergies  Allergen Reactions  . Penicillins Itching and Rash    Did it involve swelling of the face/tongue/throat, SOB, or low BP? No Did it involve sudden or severe rash/hives, skin peeling, or any reaction on the inside of your mouth or nose? No Did you need to seek medical attention at a hospital or doctor's office? No When did it last happen?~25 years ago If all  above answers are "NO", may proceed with cephalosporin use.   . No Healthtouch Food Allergies     Honey-Rash Artificial Sweeteners-rash  . Paxil [Paroxetine Hcl] Other (See Comments)    Twitching/feels like skin crawling  . Prozac [Fluoxetine Hcl] Other (See Comments)    Twitching/feels like skin crawling  . Zoloft [Sertraline Hcl] Other (See Comments)    Twitching/feels like skin crawling      Family History  Problem Relation Age of Onset  . Heart disease Mother   . Stroke Mother   . Hypertension Mother   . Cancer Father   . Stroke Father  Social History Amber Reeves reports that she has never smoked. She has never used smokeless tobacco. Amber Reeves reports no history of alcohol use.   Review of Systems CONSTITUTIONAL: No weight loss, fever, chills, weakness or fatigue.  HEENT: Eyes: No visual loss, blurred vision, double vision or yellow sclerae.No hearing loss, sneezing, congestion, runny nose or sore throat.  SKIN: No rash or itching.  CARDIOVASCULAR: per hpi RESPIRATORY: No shortness of breath, cough or sputum.  GASTROINTESTINAL: No anorexia, nausea, vomiting or diarrhea. No abdominal pain or blood.  GENITOURINARY: No burning on urination, no polyuria NEUROLOGICAL: No headache, dizziness, syncope, paralysis, ataxia, numbness or tingling in the extremities. No change in bowel or bladder control.  MUSCULOSKELETAL: No muscle, back pain, joint pain or stiffness.  LYMPHATICS: No enlarged nodes. No history of splenectomy.  PSYCHIATRIC: No history of depression or anxiety.  ENDOCRINOLOGIC: No reports of sweating, cold or heat intolerance. No polyuria or polydipsia.  Marland Kitchen   Physical Examination Vitals:   07/04/19 1145  Pulse: 64  Temp: 98.6 F (37 C)  SpO2: 97%   Filed Weights   07/04/19 1145  Weight: 228 lb (103.4 kg)    Gen: resting comfortably, no acute distress HEENT: no scleral icterus, pupils equal round and reactive, no palptable cervical adenopathy,    CV: RRR, no m/r/g, n ojvd Resp: Clear to auscultation bilaterally GI: abdomen is soft, non-tender, non-distended, normal bowel sounds, no hepatosplenomegaly MSK: extremities are warm, no edema.  Skin: warm, no rash Neuro:  no focal deficits Psych: appropriate affect   Diagnostic Studies 11/2017 cath [facility] Severe >70% long mid LAD stenosis No other significant angiographic stenosis in the other coronary arteries  DES to mid LAD   Malignant hypertension - selective renal angio without any evidence of  renal artery stenosis.   Renal angio procedure:  A Tiger 4 catheter was used to selectively engage both the left and right  renal arteries and was used to perform selective angiography.   Renal artery anatomy:  The left kidney is supplied by a single renal artery which has no  angiographic stenosis  The right kidney is supplied by a single rena artery which has no  angiographic stenosis   Jan 2021 1. Mild, non-obstructive coronary artery disease. 2. Widely patent proximal/mid LAD stent. 3. Normal left ventricular systolic function with upper normal to mildly elevated filling pressure (LVEDP ~15 mmHg).    Assessment and Plan  1. CAD - prior history as reported - Jan 2021 cath shows no significant disease - no recent symptoms, continue current meds  2. HTN - at goal, contiue curernt meds  3. Hyperlipidemia  - continue statin, labs per pcp   F/u 6 months  Arnoldo Lenis, M.D.

## 2019-07-04 NOTE — Patient Instructions (Signed)
Medication Instructions:  Your physician recommends that you continue on your current medications as directed. Please refer to the Current Medication list given to you today.  *If you need a refill on your cardiac medications before your next appointment, please call your pharmacy*   Lab Work: NONE   If you have labs (blood work) drawn today and your tests are completely normal, you will receive your results only by: . MyChart Message (if you have MyChart) OR . A paper copy in the mail If you have any lab test that is abnormal or we need to change your treatment, we will call you to review the results.   Testing/Procedures: NONE    Follow-Up: At [facility], you and your health needs are our priority.  As part of our continuing mission to provide you with exceptional heart care, we have created designated Provider Care Teams.  These Care Teams include your primary Cardiologist (physician) and Advanced Practice Providers (APPs -  Physician Assistants and Nurse Practitioners) who all work together to provide you with the care you need, when you need it.  We recommend signing up for the patient portal called "MyChart".  Sign up information is provided on this After Visit Summary.  MyChart is used to connect with patients for Virtual Visits (Telemedicine).  Patients are able to view lab/test results, encounter notes, upcoming appointments, etc.  Non-urgent messages can be sent to your provider as well.   To learn more about what you can do with MyChart, go to [email or web address].    Your next appointment:   6 month(s)  The format for your next appointment:   In Person  Provider:   Jonathan Branch, MD   Other Instructions Thank you for choosing [facility]!    

## 2019-07-17 ENCOUNTER — Ambulatory Visit: Payer: Medicare HMO | Admitting: Nutrition

## 2019-07-24 ENCOUNTER — Ambulatory Visit: Payer: Medicare HMO | Admitting: "Endocrinology

## 2019-07-30 ENCOUNTER — Ambulatory Visit: Payer: Medicare HMO | Admitting: Family Medicine

## 2019-08-08 ENCOUNTER — Other Ambulatory Visit: Payer: Self-pay

## 2019-08-08 ENCOUNTER — Other Ambulatory Visit: Payer: Self-pay | Admitting: Family Medicine

## 2019-08-08 ENCOUNTER — Encounter: Payer: Self-pay | Admitting: Family Medicine

## 2019-08-08 ENCOUNTER — Ambulatory Visit (INDEPENDENT_AMBULATORY_CARE_PROVIDER_SITE_OTHER): Payer: Medicare HMO | Admitting: Family Medicine

## 2019-08-08 VITALS — BP 123/62 | HR 56 | Temp 97.3°F | Ht 63.0 in | Wt 225.8 lb

## 2019-08-08 DIAGNOSIS — E1165 Type 2 diabetes mellitus with hyperglycemia: Secondary | ICD-10-CM

## 2019-08-08 DIAGNOSIS — I25119 Atherosclerotic heart disease of native coronary artery with unspecified angina pectoris: Secondary | ICD-10-CM

## 2019-08-08 DIAGNOSIS — J9801 Acute bronchospasm: Secondary | ICD-10-CM | POA: Diagnosis not present

## 2019-08-08 DIAGNOSIS — E1159 Type 2 diabetes mellitus with other circulatory complications: Secondary | ICD-10-CM | POA: Diagnosis not present

## 2019-08-08 DIAGNOSIS — Z6841 Body Mass Index (BMI) 40.0 and over, adult: Secondary | ICD-10-CM | POA: Diagnosis not present

## 2019-08-08 DIAGNOSIS — I10 Essential (primary) hypertension: Secondary | ICD-10-CM

## 2019-08-08 DIAGNOSIS — I152 Hypertension secondary to endocrine disorders: Secondary | ICD-10-CM

## 2019-08-08 LAB — BAYER DCA HB A1C WAIVED: HB A1C (BAYER DCA - WAIVED): 8.6 % — ABNORMAL HIGH (ref <7.0)

## 2019-08-08 NOTE — Patient Instructions (Addendum)
A1c is up to 8.6.  Use the insulin as directed.  See me back in 3 months  I am going to try and get you a sample of a controller inhaler.  Let me get our pharmacist, [name], to reach out to you as soon as I get a free sample for you.  She will walk you through the process.  Consider adding Cromolyn nasal spray (this helps with allergies).  Ok to continue other allergy medications.

## 2019-08-08 NOTE — Progress Notes (Signed)
Subjective: CC: DM2, HTN, HLD HPI: Amber Reeves is a 69 y.o. female presenting to clinic today for:  1.  Type 2 diabetes with hypertension and hyperlipidemia History: No history of hospitalizations for diabetes/DKA.  No history of retinopathy or diabetic foot ulcers  Patient is intermittently compliant with her injectables.  Uncertain if she is compliant with oral therapies.  She did see the endocrinologist but "did not really like him because he did not know her".  She describes hypoglycemic symptoms that include lightheadedness, dizziness, feeling sick with her insulin.  However, when she measures her blood sugar and blood pressure they are in normal range.  This is what causes her to not want to use her insulin regularly  Last eye exam: needs Last foot exam: needs Last A1c:  Lab Results  Component Value Date   HGBA1C 7.9 (H) 04/29/2019   Nephropathy screen indicated?: on ACE-I Last flu, zoster and/or pneumovax:  Immunization History  Administered Date(s) Administered  . Influenza, High Dose Seasonal PF 02/16/2017  . Influenza,inj,Quad PF,6+ Mos 12/18/2013  . Moderna SARS-COVID-2 Vaccination 05/01/2019, 05/29/2019    ROS: No chest pain but over the last 2 weeks she has been having some shortness of breath.  No fevers, chills, hemoptysis.  However, she notes that when she had pneumonia previously she did not have any symptoms either.  She uses her albuterol inhaler but has not been using her nebulizer.  She was previously on a controller medication but was unsure of what the name was or if it was helpful.  She finds that the symptoms seem to be precipitated by allergens.   Past Medical History:  Diagnosis Date  . Allergy   . Anxiety   . Arthritis   . Asthma   . Depression   . Diabetes mellitus without complication ([place])   . GERD (gastroesophageal reflux disease)   . Hyperlipidemia   . Hypertension    Past Surgical History:  Procedure Laterality Date  . ABDOMINAL  HYSTERECTOMY    . CESAREAN SECTION     3  . CHOLECYSTECTOMY  2018  . CORONARY STENT PLACEMENT  10/2017   [facility])  . LEFT HEART CATH AND CORONARY ANGIOGRAPHY N/A 02/25/2019   Procedure: LEFT HEART CATH AND CORONARY ANGIOGRAPHY;  Surgeon: Nelva Bush, MD;  Location: [place] [facility];  Service: Cardiovascular;  Laterality: N/A;  . REPLACEMENT TOTAL KNEE BILATERAL Bilateral    done in [place] (2003/2005)   Social History   Socioeconomic History  . Marital status: Married    Spouse name: Not on file  . Number of children: Not on file  . Years of education: Not on file  . Highest education level: Not on file  Occupational History  . Not on file  Tobacco Use  . Smoking status: Never Smoker  . Smokeless tobacco: Never Used  Vaping Use  . Vaping Use: Never used  Substance and Sexual Activity  . Alcohol use: Never  . Drug use: Never  . Sexual activity: Not Currently  Other Topics Concern  . Not on file  Social History Narrative   Resides at home with her husband.  She is originally from [place] but relocated to [place] and then to [place] in August 2020.  She has a son who lives about 2 hours away in [place] and a sister-in-law who lives in [place].   Social Determinants of Health   Financial Resource Strain:   . Difficulty of Paying Living Expenses:  Food Insecurity:   . Worried About Charity fundraiser in the Last Year:   . Arboriculturist in the Last Year:   Transportation Needs:   . Film/video editor (Medical):   Marland Kitchen Lack of Transportation (Non-Medical):   Physical Activity:   . Days of Exercise per Week:   . Minutes of Exercise per Session:   Stress:   . Feeling of Stress :   Social Connections:   . Frequency of Communication with Friends and Family:   . Frequency of Social Gatherings with Friends and Family:   . Attends Religious Services:   . Active Member of Clubs or Organizations:   . Attends Archivist Meetings:    Marland Kitchen Marital Status:   Intimate Partner Violence:   . Fear of Current or Ex-Partner:   . Emotionally Abused:   Marland Kitchen Physically Abused:   . Sexually Abused:    Current Meds  Medication Sig  . albuterol (VENTOLIN HFA) 108 (90 Base) MCG/ACT inhaler INHALE 2 PUFFS BY MOUTH EVERY 4 HOURS AS NEEDED FOR WHEEZING  . Alcohol Swabs (B-D SINGLE USE SWABS REGULAR) PADS Apply topically.  Marland Kitchen aspirin EC 81 MG tablet Take 81 mg by mouth daily.   Marland Kitchen atorvastatin (LIPITOR) 40 MG tablet Take 1 tablet (40 mg total) by mouth daily at 6 PM.  . Blood Glucose Calibration (ACCU-CHEK AVIVA) SOLN 2 Bottles by Other route as needed.  . diphenhydrAMINE (BENADRYL) 25 MG tablet Take 25 mg by mouth every 6 (six) hours as needed for allergies.  Marland Kitchen docusate sodium (COLACE) 100 MG capsule Take 100 mg by mouth 2 (two) times daily as needed (constipation.).   Marland Kitchen fluticasone (FLONASE) 50 MCG/ACT nasal spray Place 2 sprays into both nostrils daily. (Patient taking differently: Place 1 spray into both nostrils daily. )  . furosemide (LASIX) 20 MG tablet Take 20 mg by mouth daily as needed.  Marland Kitchen glucose blood test strip TEST BLOOD SUGAR TWICE DAILY  . insulin NPH-regular Human (70-30) 100 UNIT/ML injection Inject 20 Units into the skin 2 (two) times daily before a meal.  . Insulin Syringe-Needle U-100 31G X 5/16" 0.5 ML MISC Use to inject Insulin four times daily: Use as directed; Dx: E11.9, E66.9  . ipratropium-albuterol (DUONEB) 0.5-2.5 (3) MG/3ML SOLN TAKE 3 ML (1 VIAL) BY NEBULIZATION EVERY 6 HOURS AS NEEDED FOR WHEEZING. (Patient taking differently: Inhale 3 mLs into the lungs every 6 (six) hours as needed (wheezing.). )  . KLOR-CON M10 10 MEQ tablet TAKE 1 TABLET (10 MEQ TOTAL) BY MOUTH EVERY EVENING.  Marland Kitchen lansoprazole (PREVACID) 30 MG capsule Take 30 mg by mouth at bedtime.   Marland Kitchen lisinopril (ZESTRIL) 40 MG tablet Take 1 tablet (40 mg total) by mouth daily.  . magnesium oxide (MAG-OX) 400 (241.3 Mg) MG tablet Take 1 tablet (400 mg total)  by mouth daily.  . metoprolol tartrate (LOPRESSOR) 25 MG tablet Take 1 tablet (25 mg total) by mouth 2 (two) times daily.  . montelukast (SINGULAIR) 10 MG tablet TAKE 1 TABLET EVERY NIGHT (Patient taking differently: Take 10 mg by mouth at bedtime. )  . pioglitazone (ACTOS) 30 MG tablet TAKE 1 TABLET BY MOUTH EVERY DAY   Current Facility-Administered Medications for the 08/08/19 encounter (Office Visit) with Janora Norlander, DO  Medication  . sodium chloride flush (NS) 0.9 % injection 3 mL   Family History  Problem Relation Age of Onset  . Heart disease Mother   . Stroke Mother   .  Hypertension Mother   . Cancer Father   . Stroke Father    Allergies  Allergen Reactions  . Penicillins Itching and Rash    Did it involve swelling of the face/tongue/throat, SOB, or low BP? No Did it involve sudden or severe rash/hives, skin peeling, or any reaction on the inside of your mouth or nose? No Did you need to seek medical attention at a hospital or doctor's office? No When did it last happen?~25 years ago If all above answers are "NO", may proceed with cephalosporin use.   . No Healthtouch Food Allergies     Honey-Rash Artificial Sweeteners-rash  . Paxil [Paroxetine Hcl] Other (See Comments)    Twitching/feels like skin crawling  . Prozac [Fluoxetine Hcl] Other (See Comments)    Twitching/feels like skin crawling  . Zoloft [Sertraline Hcl] Other (See Comments)    Twitching/feels like skin crawling     ROS: Per HPI  Objective: Office vital signs reviewed. BP 123/62   Pulse (!) 56   Temp (!) 97.3 F (36.3 C)   Ht 5' 3"  (1.6 m)   Wt 225 lb 12.8 oz (102.4 kg)   SpO2 98%   BMI 40.00 kg/m   Physical Examination:  General: Awake, alert, obese, No acute distress HEENT: Normal; sclera white.  Moist mucous membranes Cardio: regular rate and rhythm, S1S2 heard, no murmurs appreciated Pulm: clear to auscultation bilaterally, no wheezes, rhonchi or rales; normal work of  breathing on room air Extremities: warm, well perfused. MSK: Normal tone.  Ambulates independently Neuro: see DM foot  Diabetic Foot Exam - Simple   Simple Foot Form Diabetic Foot exam was performed with the following findings: Yes 08/08/2019  3:31 PM  Visual Inspection See comments: Yes Sensation Testing Intact to touch and monofilament testing bilaterally: Yes Pulse Check Posterior Tibialis and Dorsalis pulse intact bilaterally: Yes Comments Some onychomycotic changes to the nails bilaterally.  Otherwise normal exam     Depression screen [facility] 2/9 08/08/2019 04/29/2019 01/29/2019  Decreased Interest 0 0 1  Down, Depressed, Hopeless 0 0 1  PHQ - 2 Score 0 0 2  Altered sleeping - 0 -  Tired, decreased energy - 0 1  Change in appetite - 0 0  Feeling bad or failure about yourself  - 0 0  Trouble concentrating - 0 0  Moving slowly or fidgety/restless - 0 0  Suicidal thoughts - - 0  PHQ-9 Score - 0 -  Difficult doing work/chores - - Not difficult at all    Assessment/ Plan: 69 y.o. female   1. Uncontrolled type 2 diabetes mellitus with hyperglycemia (HCC) Sugar with elevated A1c.  I suspect again that this is due to noncompliance.  I reiterated the need for compliance with medication regimen.  It sounds like she probably is having relative hypoglycemic episodes but I reinforced that as long as her blood sugars remain elevated anytime she has somewhat control of her blood sugar she is likely can I feel that she is relatively hypoglycemic.  This should normalize over time.  She made a verbal agreement to proceed with sugar control. - Bayer DCA Hb A1c Waived  2. Hypertension associated with diabetes ([place]) Controlled  3. Coronary artery disease involving native coronary artery of native heart with angina pectoris ([place]) Need for blood pressure, cholesterol and sugar control  4. Bronchospasm, acute We will plan for corticosteroid sample like Anoro, Dulera or Breo.  I am going to reach  out to our pharmacist to place her on  a list for when these come in.  She does have some financial constraints that may be limiting.  If persistent, or if she develops any worrisome symptoms or signs, low threshold to perform chest x-ray, which was offered today.   Janora Norlander, DO [place] [phone]

## 2019-08-31 ENCOUNTER — Other Ambulatory Visit: Payer: Self-pay | Admitting: Family Medicine

## 2019-08-31 DIAGNOSIS — Z794 Long term (current) use of insulin: Secondary | ICD-10-CM

## 2019-10-04 ENCOUNTER — Other Ambulatory Visit: Payer: Self-pay | Admitting: Family Medicine

## 2019-11-12 ENCOUNTER — Ambulatory Visit (INDEPENDENT_AMBULATORY_CARE_PROVIDER_SITE_OTHER): Payer: Medicare HMO | Admitting: Family Medicine

## 2019-11-12 ENCOUNTER — Other Ambulatory Visit: Payer: Self-pay

## 2019-11-12 ENCOUNTER — Telehealth: Payer: Self-pay | Admitting: Pharmacist

## 2019-11-12 ENCOUNTER — Encounter: Payer: Self-pay | Admitting: Family Medicine

## 2019-11-12 VITALS — BP 115/59 | HR 69 | Temp 97.7°F | Ht 63.0 in | Wt 229.0 lb

## 2019-11-12 DIAGNOSIS — J4540 Moderate persistent asthma, uncomplicated: Secondary | ICD-10-CM

## 2019-11-12 DIAGNOSIS — I25119 Atherosclerotic heart disease of native coronary artery with unspecified angina pectoris: Secondary | ICD-10-CM

## 2019-11-12 DIAGNOSIS — E1159 Type 2 diabetes mellitus with other circulatory complications: Secondary | ICD-10-CM | POA: Diagnosis not present

## 2019-11-12 DIAGNOSIS — I10 Essential (primary) hypertension: Secondary | ICD-10-CM | POA: Diagnosis not present

## 2019-11-12 DIAGNOSIS — I152 Hypertension secondary to endocrine disorders: Secondary | ICD-10-CM

## 2019-11-12 DIAGNOSIS — E1165 Type 2 diabetes mellitus with hyperglycemia: Secondary | ICD-10-CM | POA: Diagnosis not present

## 2019-11-12 LAB — BAYER DCA HB A1C WAIVED: HB A1C (BAYER DCA - WAIVED): 9.5 % — ABNORMAL HIGH (ref <7.0)

## 2019-11-12 MED ORDER — LISINOPRIL 40 MG PO TABS: 40.0000 mg | ORAL_TABLET | Freq: Every day | ORAL | 3 refills | Status: DC

## 2019-11-12 MED ORDER — PIOGLITAZONE HCL 30 MG PO TABS: 30.0000 mg | ORAL_TABLET | Freq: Every day | ORAL | 3 refills | Status: DC

## 2019-11-12 MED ORDER — POTASSIUM CHLORIDE CRYS ER 10 MEQ PO TBCR
EXTENDED_RELEASE_TABLET | ORAL | 3 refills | Status: DC
Start: 2019-11-12 — End: 2020-11-20

## 2019-11-12 MED ORDER — METOPROLOL TARTRATE 25 MG PO TABS: 25.0000 mg | ORAL_TABLET | Freq: Two times a day (BID) | ORAL | 3 refills | Status: DC

## 2019-11-12 MED ORDER — ATORVASTATIN CALCIUM 40 MG PO TABS: 40.0000 mg | ORAL_TABLET | Freq: Every day | ORAL | 3 refills | Status: DC

## 2019-11-12 MED ORDER — MONTELUKAST SODIUM 10 MG PO TABS
10.0000 mg | ORAL_TABLET | Freq: Every day | ORAL | 3 refills | Status: DC
Start: 2019-11-12 — End: 2021-01-08

## 2019-11-12 MED ORDER — BREO ELLIPTA 200-25 MCG/INH IN AEPB: 1.0000 | INHALATION_SPRAY | Freq: Every day | RESPIRATORY_TRACT | 0 refills | Status: DC

## 2019-11-12 NOTE — Progress Notes (Signed)
Subjective: CC: DM2, HTN, HLD HPI: Amber Reeves is a 69 y.o. female presenting to clinic today for:  1.  Type 2 diabetes with hypertension and hyperlipidemia History: No history of hospitalizations for diabetes/DKA.  No history of retinopathy or diabetic foot ulcers  Patient is intermittently compliant with her diabetes medications.  She admits that she only used it consistently for about a week.  She monitors her blood sugars intermittently but typically does not see any blood sugars over 200.  She reports difficulty affording pen needles.  She does not report any changes in vision, chest pain.  She has chronic shortness of breath.  She has been using her nebulizers.  She does not feel the albuterol works very well.  She is been previously told that she had COPD and asthma but then later told that COPD was not a true diagnosis.  She is not currently on a controller inhaler.  Last eye exam: needs Last foot exam: UTD Last A1c:  Lab Results  Component Value Date   HGBA1C 8.6 (H) 08/08/2019   Nephropathy screen indicated?: on ACE-I Last flu, zoster and/or pneumovax:  Immunization History  Administered Date(s) Administered  . Influenza, High Dose Seasonal PF 02/16/2017  . Influenza,inj,Quad PF,6+ Mos 12/18/2013  . Moderna SARS-COVID-2 Vaccination 05/01/2019, 05/29/2019    Past Medical History:  Diagnosis Date  . Allergy   . Anxiety   . Arthritis   . Asthma   . Depression   . Diabetes mellitus without complication ([place])   . GERD (gastroesophageal reflux disease)   . Hyperlipidemia   . Hypertension     Social History   Socioeconomic History  . Marital status: Married    Spouse name: Not on file  . Number of children: Not on file  . Years of education: Not on file  . Highest education level: Not on file  Occupational History  . Not on file  Tobacco Use  . Smoking status: Never Smoker  . Smokeless tobacco: Never Used  Vaping Use  . Vaping Use: Never used   Substance and Sexual Activity  . Alcohol use: Never  . Drug use: Never  . Sexual activity: Not Currently  Other Topics Concern  . Not on file  Social History Narrative   Resides at home with her husband.  She is originally from [place] but relocated to [place] and then to [place] in August 2020.  She has a son who lives about 2 hours away in [place] and a sister-in-law who lives in [place].   Social Determinants of Health   Financial Resource Strain:   . Difficulty of Paying Living Expenses: Not on file  Food Insecurity:   . Worried About Charity fundraiser in the Last Year: Not on file  . Ran Out of Food in the Last Year: Not on file  Transportation Needs:   . Lack of Transportation (Medical): Not on file  . Lack of Transportation (Non-Medical): Not on file  Physical Activity:   . Days of Exercise per Week: Not on file  . Minutes of Exercise per Session: Not on file  Stress:   . Feeling of Stress : Not on file  Social Connections:   . Frequency of Communication with Friends and Family: Not on file  . Frequency of Social Gatherings with Friends and Family: Not on file  . Attends Religious Services: Not on file  . Active Member of Clubs or Organizations: Not on file  . Attends Archivist  Meetings: Not on file  . Marital Status: Not on file  Intimate Partner Violence:   . Fear of Current or Ex-Partner: Not on file  . Emotionally Abused: Not on file  . Physically Abused: Not on file  . Sexually Abused: Not on file   Current Meds  Medication Sig  . albuterol (VENTOLIN HFA) 108 (90 Base) MCG/ACT inhaler TAKE 2 PUFFS BY MOUTH EVERY 4 HOURS AS NEEDED FOR WHEEZE  . Alcohol Swabs (B-D SINGLE USE SWABS REGULAR) PADS Apply topically.  Marland Kitchen aspirin EC 81 MG tablet Take 81 mg by mouth daily.   Marland Kitchen atorvastatin (LIPITOR) 40 MG tablet Take 1 tablet (40 mg total) by mouth daily at 6 PM.  . Blood Glucose Calibration (ACCU-CHEK AVIVA) SOLN 2 Bottles by Other route as needed.   . diphenhydrAMINE (BENADRYL) 25 MG tablet Take 25 mg by mouth every 6 (six) hours as needed for allergies.  Marland Kitchen docusate sodium (COLACE) 100 MG capsule Take 100 mg by mouth 2 (two) times daily as needed (constipation.).   Marland Kitchen fluticasone (FLONASE) 50 MCG/ACT nasal spray Place 2 sprays into both nostrils daily. (Patient taking differently: Place 1 spray into both nostrils daily. )  . furosemide (LASIX) 20 MG tablet Take 20 mg by mouth daily as needed.  Marland Kitchen glucose blood test strip TEST BLOOD SUGAR TWICE DAILY  . insulin NPH-regular Human (70-30) 100 UNIT/ML injection Inject 20 Units into the skin 2 (two) times daily before a meal.  . Insulin Syringe-Needle U-100 31G X 5/16" 0.5 ML MISC Use to inject Insulin four times daily: Use as directed; Dx: E11.9, E66.9  . ipratropium-albuterol (DUONEB) 0.5-2.5 (3) MG/3ML SOLN TAKE 3 ML (1 VIAL) BY NEBULIZATION EVERY 6 HOURS AS NEEDED FOR WHEEZING. (Patient taking differently: Inhale 3 mLs into the lungs every 6 (six) hours as needed (wheezing.). )  . lansoprazole (PREVACID) 30 MG capsule Take 30 mg by mouth at bedtime.   Marland Kitchen lisinopril (ZESTRIL) 40 MG tablet Take 1 tablet (40 mg total) by mouth daily.  . magnesium oxide (MAG-OX) 400 (241.3 Mg) MG tablet Take 1 tablet (400 mg total) by mouth daily.  . metoprolol tartrate (LOPRESSOR) 25 MG tablet Take 1 tablet (25 mg total) by mouth 2 (two) times daily.  . montelukast (SINGULAIR) 10 MG tablet Take 1 tablet (10 mg total) by mouth at bedtime.  . pioglitazone (ACTOS) 30 MG tablet Take 1 tablet (30 mg total) by mouth daily.  . potassium chloride (KLOR-CON M10) 10 MEQ tablet TAKE 1 TABLET (10 MEQ TOTAL) BY MOUTH EVERY EVENING.  . [DISCONTINUED] atorvastatin (LIPITOR) 40 MG tablet Take 1 tablet (40 mg total) by mouth daily at 6 PM.  . [DISCONTINUED] KLOR-CON M10 10 MEQ tablet TAKE 1 TABLET (10 MEQ TOTAL) BY MOUTH EVERY EVENING.  . [DISCONTINUED] lisinopril (ZESTRIL) 40 MG tablet Take 1 tablet (40 mg total) by mouth daily.   . [DISCONTINUED] metoprolol tartrate (LOPRESSOR) 25 MG tablet Take 1 tablet (25 mg total) by mouth 2 (two) times daily.  . [DISCONTINUED] montelukast (SINGULAIR) 10 MG tablet TAKE 1 TABLET EVERY NIGHT (Patient taking differently: Take 10 mg by mouth at bedtime. )  . [DISCONTINUED] pioglitazone (ACTOS) 30 MG tablet TAKE 1 TABLET BY MOUTH EVERY DAY   Current Facility-Administered Medications for the 11/12/19 encounter (Office Visit) with Janora Norlander, DO  Medication  . sodium chloride flush (NS) 0.9 % injection 3 mL   Family History  Problem Relation Age of Onset  . Heart disease Mother   .  Stroke Mother   . Hypertension Mother   . Cancer Father   . Stroke Father    Allergies  Allergen Reactions  . Penicillins Itching and Rash    Did it involve swelling of the face/tongue/throat, SOB, or low BP? No Did it involve sudden or severe rash/hives, skin peeling, or any reaction on the inside of your mouth or nose? No Did you need to seek medical attention at a hospital or doctor's office? No When did it last happen?~25 years ago If all above answers are "NO", may proceed with cephalosporin use.   . No [employer] Food Allergies     Honey-Rash Artificial Sweeteners-rash  . Paxil [Paroxetine Hcl] Other (See Comments)    Twitching/feels like skin crawling  . Prozac [Fluoxetine Hcl] Other (See Comments)    Twitching/feels like skin crawling  . Zoloft [Sertraline Hcl] Other (See Comments)    Twitching/feels like skin crawling     ROS: Per HPI  Objective: Office vital signs reviewed. BP (!) 115/59   Pulse 69   Temp 97.7 F (36.5 C) (Temporal)   Ht 5' 3"  (1.6 m)   Wt 229 lb (103.9 kg)   SpO2 99%   BMI 40.57 kg/m   Physical Examination:  General: Awake, alert, obese, No acute distress HEENT: Normal; sclera white.  Moist mucous membranes Cardio: regular rate and rhythm, S1S2 heard, no murmurs appreciated Pulm: clear to auscultation bilaterally, no wheezes, rhonchi or  rales; normal work of breathing on room air Extremities: warm, well perfused.  Depression screen [facility] 2/9 11/12/2019 08/08/2019 04/29/2019  Decreased Interest 0 0 0  Down, Depressed, Hopeless 0 0 0  PHQ - 2 Score 0 0 0  Altered sleeping 0 - 0  Tired, decreased energy 0 - 0  Change in appetite 0 - 0  Feeling bad or failure about yourself  0 - 0  Trouble concentrating 0 - 0  Moving slowly or fidgety/restless 0 - 0  Suicidal thoughts 0 - -  PHQ-9 Score 0 - 0  Difficult doing work/chores - - -    Assessment/ Plan: 69 y.o. female   1. Uncontrolled type 2 diabetes mellitus with hyperglycemia ([place]) Diabetes progressively worse.  We discussed absolute need for compliance with medications.  Going to have him see [name] in the next week or so to talk about medications, try to get her on the simplest regimen as possible as complaint certainly is an issue.  We also will make sure that medications are affordable. - Bayer DCA Hb A1c Waived - CMP14+EGFR - Lipid Panel - pioglitazone (ACTOS) 30 MG tablet; Take 1 tablet (30 mg total) by mouth daily.  Dispense: 90 tablet; Refill: 3  2. Hypertension associated with diabetes ([place]) Continue current regimen.  Renewal sent - CMP14+EGFR - lisinopril (ZESTRIL) 40 MG tablet; Take 1 tablet (40 mg total) by mouth daily.  Dispense: 90 tablet; Refill: 3 - metoprolol tartrate (LOPRESSOR) 25 MG tablet; Take 1 tablet (25 mg total) by mouth 2 (two) times daily.  Dispense: 180 tablet; Refill: 3  3. Coronary artery disease involving native coronary artery of native heart with angina pectoris (HCC) Continue statin, beta-blocker - CMP14+EGFR - Lipid Panel - atorvastatin (LIPITOR) 40 MG tablet; Take 1 tablet (40 mg total) by mouth daily at 6 PM.  Dispense: 90 tablet; Refill: 3 - metoprolol tartrate (LOPRESSOR) 25 MG tablet; Take 1 tablet (25 mg total) by mouth 2 (two) times daily.  Dispense: 180 tablet; Refill: 3  4. Moderate persistent asthma without  complication Sample of Breo 200 mg provided.  We will get her set up for Symbicort patient assistance.  She will follow-up with [name] as above for financial forms for other medicines  Meds ordered this encounter  Medications  . atorvastatin (LIPITOR) 40 MG tablet    Sig: Take 1 tablet (40 mg total) by mouth daily at 6 PM.    Dispense:  90 tablet    Refill:  3  . lisinopril (ZESTRIL) 40 MG tablet    Sig: Take 1 tablet (40 mg total) by mouth daily.    Dispense:  90 tablet    Refill:  3  . metoprolol tartrate (LOPRESSOR) 25 MG tablet    Sig: Take 1 tablet (25 mg total) by mouth 2 (two) times daily.    Dispense:  180 tablet    Refill:  3  . montelukast (SINGULAIR) 10 MG tablet    Sig: Take 1 tablet (10 mg total) by mouth at bedtime.    Dispense:  90 tablet    Refill:  3  . pioglitazone (ACTOS) 30 MG tablet    Sig: Take 1 tablet (30 mg total) by mouth daily.    Dispense:  90 tablet    Refill:  3  . potassium chloride (KLOR-CON M10) 10 MEQ tablet    Sig: TAKE 1 TABLET (10 MEQ TOTAL) BY MOUTH EVERY EVENING.    Dispense:  90 tablet    Refill:  3  . fluticasone furoate-vilanterol (BREO ELLIPTA) 200-25 MCG/INH AEPB    Sig: Inhale 1 puff into the lungs daily.    Dispense:  28 each    Refill:  [place], [place] [phone]

## 2019-11-12 NOTE — Telephone Encounter (Signed)
BREO YEB#[id number], EXP 7/22 (do not have symbicort samples, will not be getting in)  Application sent to [employer] for free symbicort inhalers Medication to ship to patient home

## 2019-11-12 NOTE — Patient Instructions (Signed)
Your sugar is increasing.  I want you to see Amber Reeves as a last stitch effort to get your sugar under control without having to see a specialist.  Make sure to [place] to that appointment.  Cromolyn for allergies.  The pharmacist at [facility] can help you with this.

## 2019-11-13 LAB — CMP14+EGFR
ALT: 23 IU/L (ref 0–32)
AST: 22 IU/L (ref 0–40)
Albumin/Globulin Ratio: 1.2 (ref 1.2–2.2)
Albumin: 3.8 g/dL (ref 3.8–4.8)
Alkaline Phosphatase: 59 IU/L (ref 44–121)
BUN/Creatinine Ratio: 16 (ref 12–28)
BUN: 16 mg/dL (ref 8–27)
Bilirubin Total: 0.4 mg/dL (ref 0.0–1.2)
CO2: 21 mmol/L (ref 20–29)
Calcium: 8.9 mg/dL (ref 8.7–10.3)
Chloride: 104 mmol/L (ref 96–106)
Creatinine, Ser: 1.02 mg/dL — ABNORMAL HIGH (ref 0.57–1.00)
GFR calc Af Amer: 65 mL/min/{1.73_m2} (ref >59)
GFR calc non Af Amer: 57 mL/min/{1.73_m2} — ABNORMAL LOW (ref >59)
Globulin, Total: 3.1 g/dL (ref 1.5–4.5)
Glucose: 216 mg/dL — ABNORMAL HIGH (ref 65–99)
Potassium: 4.7 mmol/L (ref 3.5–5.2)
Sodium: 137 mmol/L (ref 134–144)
Total Protein: 6.9 g/dL (ref 6.0–8.5)

## 2019-11-13 LAB — LIPID PANEL
Chol/HDL Ratio: 3.4 ratio (ref 0.0–4.4)
Cholesterol, Total: 151 mg/dL (ref 100–199)
HDL: 45 mg/dL (ref >39)
LDL Chol Calc (NIH): 72 mg/dL (ref 0–99)
Triglycerides: 208 mg/dL — ABNORMAL HIGH (ref 0–149)
VLDL Cholesterol Cal: 34 mg/dL (ref 5–40)

## 2019-11-14 ENCOUNTER — Ambulatory Visit (INDEPENDENT_AMBULATORY_CARE_PROVIDER_SITE_OTHER): Payer: Medicare HMO | Admitting: Pharmacist

## 2019-11-14 ENCOUNTER — Other Ambulatory Visit: Payer: Self-pay

## 2019-11-14 DIAGNOSIS — E119 Type 2 diabetes mellitus without complications: Secondary | ICD-10-CM

## 2019-11-14 NOTE — Progress Notes (Signed)
    11/14/2019 Name: Amber Reeves MRN: [id number] DOB: Sep 01, 1950   S:  29 YOF Presents for diabetes evaluation, education, and management Patient was referred and last seen by Primary Care Provider on 11/12/19. Patient reports Diabetes was diagnosed:2006  Insurance coverage/medication affordability: [place]  Patient denies adherence with medications. . Current diabetes medications include: novolin 70/30, pioglitazone . Current hypertension medications include: lisinopril, metoprolol Goal 130/80 . Current hyperlipidemia medications include: atorvastatin  Patient denies hypoglycemic events.   Patient reported dietary habits: Eats 3 meals/day The patient is asked to make an attempt to improve diet and exercise patterns to aid in medical management of this problem. Discussed meal planning options and Plate method for healthy eating . Avoid sugary drinks and desserts . Incorporate balanced protein, non starchy veggies, 1 serving of carbohydrate with each meal . Increase water intake . Increase physical activity as able  Patient-reported exercise habits: n/a   O:  Lab Results  Component Value Date   HGBA1C 9.5 (H) 11/12/2019     Lipid Panel     Component Value Date/Time   CHOL 151 11/12/2019 1520   TRIG 208 (H) 11/12/2019 1520   HDL 45 11/12/2019 1520   CHOLHDL 3.4 11/12/2019 1520   LDLCALC 72 11/12/2019 1520     Home fasting blood sugars: N/A NOT CHECKING  2 hour post-meal/random blood sugars: N/A.    A/P:  Diabetes T2DM currently UNCONTROLLED, A1C 9.5%. Patient is not adherent with medication. Control is suboptimal due to DIET, NOT TAKING MEDICATIONS AS PRESCRIBED-->OFTEN DUE TO COST   DISCONTINUE NOVOLIN 70/30 INSULIN   START FARXIGA 5MG DAILY ; WILL INCREASE TO 10MG DAILY ONCE PATIENT ASSISTANCE SHIPMENT ARRIVES  SAMPLES GIVEN [id number], EXP 6/06  APPLICATION FOR [employer] PATIENT ASSISTANCE SENT TO COMPANY Counseled Sick day rules: if sick,  vomiting, have diarrhea, or   cannot drink enough fluids, you should stop taking SGLT-2 inhibitors until your symptoms go away. In rare cases, these medicines can cause diabetic ketoacidosis (DKA). DKA is acid buildup in the blood.   START TRESIBA 40 UNITS DAILY--PATIENT TO TAKE IN THE MORNING; THIS IS BEST FOR HER SCHEDULE (SHE IS OFTEN FORGETTING NIGHTTIME DOSES OF INSULIN  SAMPLES GIVEN-->TRESIBA YOK#[id number], EXP 4/23   APPLICATION SUBMITTED TO [facility] FOR PATIENT ASSISTANCE  CONTINUE PIOGLITAZONE FOR NOW  -Extensively discussed pathophysiology of diabetes, recommended lifestyle interventions, dietary effects on blood sugar control  -Counseled on s/sx of and management of hypoglycemia  -Next A1C anticipated 3 MONTHS.   Written patient instructions provided.  Total time in face to face counseling 30 minutes.   Regina Eck, PharmD, BCPS Clinical Pharmacist, [place]  II Phone [phone]

## 2019-11-29 ENCOUNTER — Telehealth: Payer: Self-pay

## 2019-11-29 DIAGNOSIS — R300 Dysuria: Secondary | ICD-10-CM | POA: Diagnosis not present

## 2019-11-29 DIAGNOSIS — E785 Hyperlipidemia, unspecified: Secondary | ICD-10-CM | POA: Insufficient documentation

## 2019-11-29 DIAGNOSIS — R309 Painful micturition, unspecified: Secondary | ICD-10-CM | POA: Diagnosis not present

## 2019-11-29 DIAGNOSIS — E1159 Type 2 diabetes mellitus with other circulatory complications: Secondary | ICD-10-CM | POA: Insufficient documentation

## 2019-11-29 DIAGNOSIS — I152 Hypertension secondary to endocrine disorders: Secondary | ICD-10-CM | POA: Insufficient documentation

## 2019-11-29 DIAGNOSIS — R81 Glycosuria: Secondary | ICD-10-CM | POA: Diagnosis not present

## 2019-11-29 DIAGNOSIS — E1169 Type 2 diabetes mellitus with other specified complication: Secondary | ICD-10-CM | POA: Insufficient documentation

## 2019-11-29 DIAGNOSIS — R8290 Unspecified abnormal findings in urine: Secondary | ICD-10-CM | POA: Diagnosis not present

## 2019-11-29 NOTE — Telephone Encounter (Signed)
After speaking with patient, she explains that her diarrhea is not that bad. She is urinating more and has dysuria, she also noticed a little blood after urinating. I told her that I didn't think it was the two new meds (farxiga and tresiba). I sent her to urgent care to check for UTI, because we have no appts until Monday

## 2019-12-09 ENCOUNTER — Telehealth: Payer: Self-pay | Admitting: Pharmacist

## 2019-12-09 NOTE — Telephone Encounter (Signed)
Patient's FBG 100-130 Had to go to urgent care for UTI, does not wish to take Farxiga at this time Will discuss potential GLP1 vs DPP4 at next visit  Patient's 4 month supply of Novo Nordisk patient assistance medication (tresiba) have arrived and are in refrigerator    Patient called and verbalizes understanding

## 2019-12-28 ENCOUNTER — Other Ambulatory Visit: Payer: Self-pay | Admitting: Family Medicine

## 2020-01-07 ENCOUNTER — Other Ambulatory Visit: Payer: Self-pay

## 2020-01-07 ENCOUNTER — Encounter: Payer: Self-pay | Admitting: Cardiology

## 2020-01-07 ENCOUNTER — Ambulatory Visit: Payer: Medicare HMO | Admitting: Cardiology

## 2020-01-07 VITALS — BP 146/86 | HR 60 | Ht 63.0 in | Wt 231.6 lb

## 2020-01-07 DIAGNOSIS — I2510 Atherosclerotic heart disease of native coronary artery without angina pectoris: Secondary | ICD-10-CM

## 2020-01-07 DIAGNOSIS — E782 Mixed hyperlipidemia: Secondary | ICD-10-CM

## 2020-01-07 DIAGNOSIS — I10 Essential (primary) hypertension: Secondary | ICD-10-CM

## 2020-01-07 MED ORDER — MAGNESIUM OXIDE 400 MG PO TABS: ORAL_TABLET | ORAL | 3 refills | Status: DC

## 2020-01-07 MED ORDER — ISOSORBIDE MONONITRATE ER 60 MG PO TB24: 60.0000 mg | ORAL_TABLET | Freq: Every day | ORAL | 3 refills | Status: DC

## 2020-01-07 MED ORDER — FUROSEMIDE 20 MG PO TABS
20.0000 mg | ORAL_TABLET | Freq: Every day | ORAL | 3 refills | Status: DC | PRN
Start: 2020-01-07 — End: 2020-07-08

## 2020-01-07 NOTE — Progress Notes (Signed)
Clinical Summary Ms. Nawaz is a 69 y.o.female seen today for follow up of the following medical problems.  1. CAD -11/2017 cath at [facility] showed >70% mid LAD, otherwise patent vessels. She presented with chest pain, not an MI - received DES to LAD - 11/2017 echo LVEF Complete echocardiogram shows a left ventricular EF by visual approximation 60 to 65%. Normal left ventricular systolic function. Grade 1 left ventricular diastolic function. No significant valvular disease. No pericardial effusion  01/2018 stress test: THR not achieved, exercise limiting angina , duke treadmill score neg 1  - she reports prior false negative stress tests in the past.   - 7/14/20nuclear stress by prior cardiollogist showedanterior breast artifact, probably normal,  Jan 2021 cath: mild nonobstructive disease. - infrequent pains at times - compliant with meds    2. HTN - renal artery angio at time of 11/2017 cath showed no significant renal artery disease -compliant with meds   3. Hyperlipidemia -she is compliant with statin.   10/2019 TC 151 TG 208 HDL 45 LDL 70  4. OSA on cpap - she attributes getting pneumonia when on cpap, she stopped wearing.  - does not want to wear cpap  5. Dizziness - oftren with standing - 1-2 bottles per day of water   Past Medical History:  Diagnosis Date  . Allergy   . Anxiety   . Arthritis   . Asthma   . Depression   . Diabetes mellitus without complication ([place])   . GERD (gastroesophageal reflux disease)   . Hyperlipidemia   . Hypertension      Allergies  Allergen Reactions  . Penicillins Itching and Rash    Did it involve swelling of the face/tongue/throat, SOB, or low BP? No Did it involve sudden or severe rash/hives, skin peeling, or any reaction on the inside of your mouth or nose? No Did you need to seek medical attention at a hospital or doctor's office? No When did it last happen?~25 years ago If all above  answers are "NO", may proceed with cephalosporin use.   Wilder Glade [Dapagliflozin]     UTIs  . Metformin And Related     GI distress, dizziness  . No [facility]alt[facility] Food Allergies     Honey-Rash Artificial Sweeteners-rash  . Paxil [Paroxetine Hcl] Other (See Comments)    Twitching/feels like skin crawling  . Prozac [Fluoxetine Hcl] Other (See Comments)    Twitching/feels like skin crawling  . Zoloft [Sertraline Hcl] Other (See Comments)    Twitching/feels like skin crawling     Current Outpatient Medications  Medication Sig Dispense Refill  . albuterol (VENTOLIN HFA) 108 (90 Base) MCG/ACT inhaler TAKE 2 PUFFS BY MOUTH EVERY 4 HOURS AS NEEDED FOR WHEEZE 18 g 3  . Alcohol Swabs (B-D SINGLE USE SWABS REGULAR) PADS Apply topically.    Marland Kitchen aspirin EC 81 MG tablet Take 81 mg by mouth daily.     Marland Kitchen atorvastatin (LIPITOR) 40 MG tablet Take 1 tablet (40 mg total) by mouth daily at 6 PM. 90 tablet 3  . Blood Glucose Calibration (ACCU-CHEK AVIVA) SOLN 2 Bottles by Other route as needed.    . budesonide-formoterol (SYMBICORT) 160-4.5 MCG/ACT inhaler Inhale 2 puffs into the lungs 2 (two) times daily.    . dapagliflozin propanediol (FARXIGA) 10 MG TABS tablet Take 10 mg by mouth daily.    . diphenhydrAMINE (BENADRYL) 25 MG tablet Take 25 mg by mouth every 6 (six) hours as needed for allergies.    Marland Kitchen  docusate sodium (COLACE) 100 MG capsule Take 100 mg by mouth 2 (two) times daily as needed (constipation.).     Marland Kitchen fluticasone (FLONASE) 50 MCG/ACT nasal spray Place 2 sprays into both nostrils daily. (Patient taking differently: Place 1 spray into both nostrils daily. ) 48 g 3  . furosemide (LASIX) 20 MG tablet Take 20 mg by mouth daily as needed.    Marland Kitchen glucose blood test strip TEST BLOOD SUGAR TWICE DAILY    . insulin degludec (TRESIBA FLEXTOUCH) 200 UNIT/ML FlexTouch Pen Inject 40 Units into the skin daily.    . Insulin Syringe-Needle U-100 31G X 5/16" 0.5 ML MISC Use to inject Insulin four times daily: Use  as directed; Dx: E11.9, E66.9    . ipratropium-albuterol (DUONEB) 0.5-2.5 (3) MG/3ML SOLN TAKE 3 ML (1 VIAL) BY NEBULIZATION EVERY 6 HOURS AS NEEDED FOR WHEEZING. (Patient taking differently: Inhale 3 mLs into the lungs every 6 (six) hours as needed (wheezing.). ) 360 mL 0  . isosorbide mononitrate (IMDUR) 60 MG 24 hr tablet Take 1 tablet (60 mg total) by mouth daily. 90 tablet 3  . lansoprazole (PREVACID) 30 MG capsule Take 30 mg by mouth at bedtime.     Marland Kitchen lisinopril (ZESTRIL) 40 MG tablet Take 1 tablet (40 mg total) by mouth daily. 90 tablet 3  . magnesium oxide (MAG-OX) 400 MG tablet TAKE 1 TABLET BY MOUTH EVERY DAY 90 tablet 0  . metoprolol tartrate (LOPRESSOR) 25 MG tablet Take 1 tablet (25 mg total) by mouth 2 (two) times daily. 180 tablet 3  . montelukast (SINGULAIR) 10 MG tablet Take 1 tablet (10 mg total) by mouth at bedtime. 90 tablet 3  . pioglitazone (ACTOS) 30 MG tablet Take 1 tablet (30 mg total) by mouth daily. 90 tablet 3  . potassium chloride (KLOR-CON M10) 10 MEQ tablet TAKE 1 TABLET (10 MEQ TOTAL) BY MOUTH EVERY EVENING. 90 tablet 3   Current Facility-Administered Medications  Medication Dose Route Frequency Provider Last Rate Last Admin  . sodium chloride flush (NS) 0.9 % injection 3 mL  3 mL Intravenous Q12H , Alphonse Guild, MD         Past Surgical History:  Procedure Laterality Date  . ABDOMINAL HYSTERECTOMY    . CESAREAN SECTION     3  . CHOLECYSTECTOMY  2018  . CORONARY STENT PLACEMENT  10/2017   [facility])  . LEFT HEART CATH AND CORONARY ANGIOGRAPHY N/A 02/25/2019   Procedure: LEFT HEART CATH AND CORONARY ANGIOGRAPHY;  Surgeon: Nelva Bush, MD;  Location: [place] [facility];  Service: Cardiovascular;  Laterality: N/A;  . REPLACEMENT TOTAL KNEE BILATERAL Bilateral    done in [place] (2003/2005)     Allergies  Allergen Reactions  . Penicillins Itching and Rash    Did it involve swelling of the face/tongue/throat, SOB, or low BP?  No Did it involve sudden or severe rash/hives, skin peeling, or any reaction on the inside of your mouth or nose? No Did you need to seek medical attention at a hospital or doctor's office? No When did it last happen?~25 years ago If all above answers are "NO", may proceed with cephalosporin use.   Wilder Glade [Dapagliflozin]     UTIs  . Metformin And Related     GI distress, dizziness  . No Healt[employer]ouch Food Allergies     Honey-Rash Artificial Sweeteners-rash  . Paxil [Paroxetine Hcl] Other (See Comments)    Twitching/feels like skin crawling  . Prozac [Fluoxetine Hcl] Other (  See Comments)    Twitching/feels like skin crawling  . Zoloft [Sertraline Hcl] Other (See Comments)    Twitching/feels like skin crawling      Family History  Problem Relation Age of Onset  . Heart disease Mother   . Stroke Mother   . Hypertension Mother   . Cancer Father   . Stroke Father      Social History Ms. Mitter reports that she has never smoked. She has never used smokeless tobacco. Ms. Nearhood reports no history of alcohol use.   Review of Systems CONSTITUTIONAL: No weight loss, fever, chills, weakness or fatigue.  HEENT: Eyes: No visual loss, blurred vision, double vision or yellow sclerae.No hearing loss, sneezing, congestion, runny nose or sore throat.  SKIN: No rash or itching.  CARDIOVASCULAR: per hpi RESPIRATORY: No shortness of breath, cough or sputum.  GASTROINTESTINAL: No anorexia, nausea, vomiting or diarrhea. No abdominal pain or blood.  GENITOURINARY: No burning on urination, no polyuria NEUROLOGICAL: No headache, dizziness, syncope, paralysis, ataxia, numbness or tingling in the extremities. No change in bowel or bladder control.  MUSCULOSKELETAL: No muscle, back pain, joint pain or stiffness.  LYMPHATICS: No enlarged nodes. No history of splenectomy.  PSYCHIATRIC: No history of depression or anxiety.  ENDOCRINOLOGIC: No reports of sweating, cold or heat intolerance. No  polyuria or polydipsia.  Marland Kitchen   Physical Examination Today's Vitals   01/07/20 1344  BP: (!) 146/86  Pulse: 60  SpO2: 97%  Weight: 231 lb 9.6 oz (105.1 kg)  Height: 5' 3"  (1.6 m)   Body mass index is 41.03 kg/m.  Gen: resting comfortably, no acute distress HEENT: no scleral icterus, pupils equal round and reactive, no palptable cervical adenopathy,  CV: RRR, no m/r/g, no jvd Resp: Clear to auscultation bilaterally GI: abdomen is soft, non-tender, non-distended, normal bowel sounds, no hepatosplenomegaly MSK: extremities are warm, no edema.  Skin: warm, no rash Neuro:  no focal deficits Psych: appropriate affect   Diagnostic Studies 11/2017 cath [facility] Severe >70% long mid LAD stenosis No other significant angiographic stenosis in the other coronary arteries  DES to mid LAD   Malignant hypertension - selective renal angio without any evidence of  renal artery stenosis.   Renal angio procedure:  A Tiger 4 catheter was used to selectively engage both the left and right  renal arteries and was used to perform selective angiography.   Renal artery anatomy:  The left kidney is supplied by a single renal artery which has no  angiographic stenosis  The right kidney is supplied by a single rena artery which has no  angiographic stenosis   Jan 2021 1. Mild, non-obstructive coronary artery disease. 2. Widely patent proximal/mid LAD stent. 3. Normal left ventricular systolic function with upper normal to mildly elevated filling pressure (LVEDP ~15 mmHg).    Assessment and Plan   1. CAD -Jan 2021 cath shows no significant disease - no recent cardiac chest pains, continue current meds  2. HTN -bp elevated today, from multiple recent visits has been previously at goal - monitor at this time, continue current meds  3. Hyperlipidemia  -at goal, continue statin    Arnoldo Lenis, M.D.

## 2020-01-07 NOTE — Addendum Note (Signed)
Addended by: Barbarann Ehlers A on: 01/07/2020 02:42 PM   Modules accepted: Orders

## 2020-01-07 NOTE — Patient Instructions (Signed)
Medication Instructions:  Only take Magnesium on days you take Lasix  *If you need a refill on your cardiac medications before your next appointment, please call your pharmacy*   Lab Work: None today If you have labs (blood work) drawn today and your tests are completely normal, you will receive your results only by: Marland Kitchen MyChart Message (if you have MyChart) OR . A paper copy in the mail If you have any lab test that is abnormal or we need to change your treatment, we will call you to review the results.   Testing/Procedures: None today   Follow-Up: At [facility], you and your health needs are our priority.  As part of our continuing mission to provide you with exceptional heart care, we have created designated Provider Care Teams.  These Care Teams include your primary Cardiologist (physician) and Advanced Practice Providers (APPs -  Physician Assistants and Nurse Practitioners) who all work together to provide you with the care you need, when you need it.  We recommend signing up for the patient portal called "MyChart".  Sign up information is provided on this After Visit Summary.  MyChart is used to connect with patients for Virtual Visits (Telemedicine).  Patients are able to view lab/test results, encounter notes, upcoming appointments, etc.  Non-urgent messages can be sent to your provider as well.   To learn more about what you can do with MyChart, go to [email or web address].    Your next appointment:   6 month(s)  The format for your next appointment:   In Person  Provider:   Carlyle Dolly, MD   Other Instructions None       Thank you for choosing [facility] !

## 2020-02-03 ENCOUNTER — Ambulatory Visit (INDEPENDENT_AMBULATORY_CARE_PROVIDER_SITE_OTHER): Payer: Medicare HMO | Admitting: Pharmacist

## 2020-02-03 ENCOUNTER — Other Ambulatory Visit: Payer: Self-pay

## 2020-02-03 DIAGNOSIS — E119 Type 2 diabetes mellitus without complications: Secondary | ICD-10-CM

## 2020-02-03 MED ORDER — BUDESONIDE-FORMOTEROL FUMARATE 160-4.5 MCG/ACT IN AERO
2.0000 | INHALATION_SPRAY | Freq: Two times a day (BID) | RESPIRATORY_TRACT | 11 refills | Status: DC
Start: 2020-02-03 — End: 2021-02-11

## 2020-02-03 NOTE — Progress Notes (Signed)
° ° °  02/03/2020 Name: Amber Reeves MRN: [id number] DOB: May 25, 1950   S:  29 YOF Presents for diabetes evaluation, education, and management Patient was referred and last seen by Primary Care Provider on 11/12/19. Patient reports Diabetes was diagnosed:2006  Insurance coverage/medication affordability: [place]  Patient denies adherence with medications.  Current diabetes medications include: TRESIBA, pioglitazone  Current hypertension medications include: lisinopril, metoprolol Goal 130/80  Current hyperlipidemia medications include: atorvastatin  Patient denies hypoglycemic events.   Patient reported dietary habits: Eats 3 meals/day The patient is asked to make an attempt to improve diet and exercise patterns to aid in medical management of this problem. Discussed meal planning options and Plate method for healthy eating  Avoid sugary drinks and desserts  Incorporate balanced protein, non starchy veggies, 1 serving of carbohydrate with each meal  Increase water intake  Increase physical activity as able  Patient-reported exercise habits: n/a    O:  Lab Results  Component Value Date   HGBA1C 9.5 (H) 11/12/2019    There were no vitals filed for this visit.     Lipid Panel     Component Value Date/Time   CHOL 151 11/12/2019 1520   TRIG 208 (H) 11/12/2019 1520   HDL 45 11/12/2019 1520   CHOLHDL 3.4 11/12/2019 1520   LDLCALC 72 11/12/2019 1520    Home fasting blood sugars: <130  2 hour post-meal/random blood sugars: N/A.  A/P:  Diabetes t2dM, patient reports better control. A1c due w/ PCP next week. Patient is adherent with medication now that we are able to get brand name medications via patient assistance programs  -REAPPLIED FOR [place] AND ME ([place]) PATIENT ASSISTANCE--MEDICATION TO [place]  -REAPPLIED FOR NOVO [place] ([place]) PATIENT ASSISTANCE--MEDICATION WILL SHIP TO PCP OFFICE  -NO CHANGES AS PATIENT REPORTS HER  BLOOD SUGARS ARE CONTROLLED; A1C FOLLOW UP ON 02/11/20  -CONTINUE MEDICATIONS AS PRESCRIBED  -CONSIDER GLP1 (OZEMPIC/TRULICITY) IF G3O NOT CONTROLLED ON FOLLOW UP  -Extensively discussed pathophysiology of diabetes, recommended lifestyle interventions, dietary effects on blood sugar control  -Counseled on s/sx of and management of hypoglycemia  -Next A1C anticipated 02/11/20.    Written patient instructions provided.  Total time in face to face counseling 25 minutes.   Follow up PCP Clinic Visit ON 02/11/20.    Regina Eck, PharmD, BCPS Clinical Pharmacist, [place]  II Phone [phone]

## 2020-02-11 ENCOUNTER — Ambulatory Visit (INDEPENDENT_AMBULATORY_CARE_PROVIDER_SITE_OTHER): Payer: Medicare HMO | Admitting: Family Medicine

## 2020-02-11 ENCOUNTER — Other Ambulatory Visit: Payer: Self-pay

## 2020-02-11 ENCOUNTER — Encounter: Payer: Self-pay | Admitting: Family Medicine

## 2020-02-11 VITALS — BP 186/76 | HR 113 | Temp 96.9°F | Ht 63.0 in

## 2020-02-11 DIAGNOSIS — J4540 Moderate persistent asthma, uncomplicated: Secondary | ICD-10-CM

## 2020-02-11 DIAGNOSIS — I152 Hypertension secondary to endocrine disorders: Secondary | ICD-10-CM

## 2020-02-11 DIAGNOSIS — Z794 Long term (current) use of insulin: Secondary | ICD-10-CM

## 2020-02-11 DIAGNOSIS — E1169 Type 2 diabetes mellitus with other specified complication: Secondary | ICD-10-CM

## 2020-02-11 DIAGNOSIS — E1165 Type 2 diabetes mellitus with hyperglycemia: Secondary | ICD-10-CM | POA: Diagnosis not present

## 2020-02-11 DIAGNOSIS — E1159 Type 2 diabetes mellitus with other circulatory complications: Secondary | ICD-10-CM

## 2020-02-11 DIAGNOSIS — I25119 Atherosclerotic heart disease of native coronary artery with unspecified angina pectoris: Secondary | ICD-10-CM

## 2020-02-11 LAB — BAYER DCA HB A1C WAIVED: HB A1C (BAYER DCA - WAIVED): 7 % — ABNORMAL HIGH (ref <7.0)

## 2020-02-11 NOTE — Progress Notes (Signed)
Subjective: CC: DM PCP: Janora Norlander, DO BVQ:[id number] Amber Reeves is a 69 y.o. female presenting to clinic today for:  1. Type 2 Diabetes with hypertension, hyperlipidemia:  Since her last visit, patient has seen [name], our clinical pharmacist.  Her sugar has been reportedly better controlled and she voiced adherence to medication regimen.  She is currently being treated with [place], Actos.  GLP-1 have been reviewed if blood sugars were not controlled on today's visit.  She is compliant with lisinopril, Lopressor and Lipitor.  Last eye exam: needs Last foot exam: UTD Last A1c:  Lab Results  Component Value Date   HGBA1C 9.5 (H) 11/12/2019   Nephropathy screen indicated?: on ACE-I Last flu, zoster and/or pneumovax:  Immunization History  Administered Date(s) Administered   Influenza, High Dose Seasonal PF 02/16/2017   Influenza,inj,Quad PF,6+ Mos 12/18/2013   Moderna Sars-Covid-2 Vaccination 05/01/2019, 05/29/2019    2.  Asthma Patient reports that she uses the Symbicort once daily typically.  She uses her rescue as needed as well.  She admits that she has been having some shortness of breath over the last couple of days but she was exposed to smoke and February's over the weekend.  These are triggers for her.  No hemoptysis, fevers.   ROS: Per HPI  Allergies  Allergen Reactions   Penicillins Itching and Rash    Did it involve swelling of the face/tongue/throat, SOB, or low BP? No Did it involve sudden or severe rash/hives, skin peeling, or any reaction on the inside of your mouth or nose? No Did you need to seek medical attention at a hospital or doctor's office? No When did it last happen?~25 years ago If all above answers are NO, may proceed with cephalosporin use.    Farxiga [Dapagliflozin]     UTIs   Metformin And Related     GI distress, dizziness   No Healthtouch Food Allergies     Honey-Rash Artificial Sweeteners-rash   Paxil [Paroxetine  Hcl] Other (See Comments)    Twitching/feels like skin crawling   Prozac [Fluoxetine Hcl] Other (See Comments)    Twitching/feels like skin crawling   Zoloft [Sertraline Hcl] Other (See Comments)    Twitching/feels like skin crawling   Past Medical History:  Diagnosis Date   Allergy    Anxiety    Arthritis    Asthma    Depression    Diabetes mellitus without complication (HCC)    GERD (gastroesophageal reflux disease)    Hyperlipidemia    Hypertension     Current Outpatient Medications:    albuterol (VENTOLIN HFA) 108 (90 Base) MCG/ACT inhaler, TAKE 2 PUFFS BY MOUTH EVERY 4 HOURS AS NEEDED FOR WHEEZE, Disp: 18 g, Rfl: 3   Alcohol Swabs (B-D SINGLE USE SWABS REGULAR) PADS, Apply topically., Disp: , Rfl:    aspirin EC 81 MG tablet, Take 81 mg by mouth daily. , Disp: , Rfl:    atorvastatin (LIPITOR) 40 MG tablet, Take 1 tablet (40 mg total) by mouth daily at 6 PM., Disp: 90 tablet, Rfl: 3   Blood Glucose Calibration (ACCU-CHEK AVIVA) SOLN, 2 Bottles by Other route as needed., Disp: , Rfl:    budesonide-formoterol (SYMBICORT) 160-4.5 MCG/ACT inhaler, Inhale 2 puffs into the lungs 2 (two) times daily., Disp: 4 each, Rfl: 11   diphenhydrAMINE (BENADRYL) 25 MG tablet, Take 25 mg by mouth every 6 (six) hours as needed for allergies., Disp: , Rfl:    docusate sodium (COLACE) 100 MG capsule, Take 100  mg by mouth 2 (two) times daily as needed (constipation.). , Disp: , Rfl:    fluticasone (FLONASE) 50 MCG/ACT nasal spray, Place 2 sprays into both nostrils daily., Disp: 48 g, Rfl: 3   furosemide (LASIX) 20 MG tablet, Take 1 tablet (20 mg total) by mouth daily as needed., Disp: 90 tablet, Rfl: 3   glucose blood test strip, TEST BLOOD SUGAR TWICE DAILY, Disp: , Rfl:    insulin degludec (TRESIBA FLEXTOUCH) 200 UNIT/ML FlexTouch Pen, Inject 40 Units into the skin daily., Disp: , Rfl:    Insulin Syringe-Needle U-100 31G X 5/16" 0.5 ML MISC, Use to inject Insulin four times  daily: Use as directed; Dx: E11.9, E66.9, Disp: , Rfl:    ipratropium-albuterol (DUONEB) 0.5-2.5 (3) MG/3ML SOLN, TAKE 3 ML (1 VIAL) BY NEBULIZATION EVERY 6 HOURS AS NEEDED FOR WHEEZING., Disp: 360 mL, Rfl: 0   isosorbide mononitrate (IMDUR) 60 MG 24 hr tablet, Take 1 tablet (60 mg total) by mouth daily., Disp: 90 tablet, Rfl: 3   lansoprazole (PREVACID) 30 MG capsule, Take 30 mg by mouth at bedtime. , Disp: , Rfl:    lisinopril (ZESTRIL) 40 MG tablet, Take 1 tablet (40 mg total) by mouth daily., Disp: 90 tablet, Rfl: 3   magnesium oxide (MAG-OX) 400 MG tablet, Take magnesium on days you take lasix, Disp: 60 tablet, Rfl: 3   metoprolol tartrate (LOPRESSOR) 25 MG tablet, Take 1 tablet (25 mg total) by mouth 2 (two) times daily., Disp: 180 tablet, Rfl: 3   montelukast (SINGULAIR) 10 MG tablet, Take 1 tablet (10 mg total) by mouth at bedtime., Disp: 90 tablet, Rfl: 3   pioglitazone (ACTOS) 30 MG tablet, Take 1 tablet (30 mg total) by mouth daily., Disp: 90 tablet, Rfl: 3   potassium chloride (KLOR-CON M10) 10 MEQ tablet, TAKE 1 TABLET (10 MEQ TOTAL) BY MOUTH EVERY EVENING., Disp: 90 tablet, Rfl: 3  Current Facility-Administered Medications:    sodium chloride flush (NS) 0.9 % injection 3 mL, 3 mL, Intravenous, Q12H, [place], Alphonse Guild, MD Social History   Socioeconomic History   Marital status: Married    Spouse name: Not on file   Number of children: Not on file   Years of education: Not on file   Highest education level: Not on file  Occupational History   Not on file  Tobacco Use   Smoking status: Never Smoker   Smokeless tobacco: Never Used  Vaping Use   Vaping Use: Never used  Substance and Sexual Activity   Alcohol use: Never   Drug use: Never   Sexual activity: Not Currently  Other Topics Concern   Not on file  Social History Narrative   Resides at home with her husband.  She is originally from [place] but relocated to [place] and then to [place]  in August 2020.  She has a son who lives about 2 hours away in [place] and a sister-in-law who lives in [place].   Social Determinants of Health   Financial Resource Strain: Not on file  Food Insecurity: Not on file  Transportation Needs: Not on file  Physical Activity: Not on file  Stress: Not on file  Social Connections: Not on file  Intimate Partner Violence: Not on file   Family History  Problem Relation Age of Onset   Heart disease Mother    Stroke Mother    Hypertension Mother    Cancer Father    Stroke Father     Objective: Office vital signs reviewed.  BP (!) 186/76    Pulse (!) 113    Temp (!) 96.9 F (36.1 C)    Ht 5' 3"  (1.6 m)    SpO2 95%    BMI 41.03 kg/m   Physical Examination:  General: Awake, alert, obese, No acute distress HEENT: Normal; sclera white Cardio: regular rate and rhythm, S1S2 heard, no murmurs appreciated Pulm: Fair air movement.  Clear to auscultation bilaterally, no wheezes, rhonchi or rales; normal work of breathing on room air  Assessment/ Plan: 69 y.o. female   Controlled type 2 diabetes mellitus with other specified complication, with long-term current use of insulin ([place]) - Plan: Bayer DCA Hb A1c Waived, Basic Metabolic Panel  Hypertension associated with diabetes ([place]) - Plan: Basic Metabolic Panel  Coronary artery disease involving native coronary artery of native heart with angina pectoris ([place]) - Plan: Basic Metabolic Panel  Moderate persistent asthma without complication  X7D is at goal now at 7.0.  No additional medications needed.  Continue current regimen.  Plan to follow-up for diabetes in 4 months but I would like her to see the nurse in 2 weeks for blood pressure recheck.  She is had quite a difference in her blood pressure from our previous visit to today.  Unsure if her bronchospasm is playing a part.  Would certainly like her BP rechecked.  Her asthma is not well controlled.  She is only been using the Symbicort once  daily I reinforced need for twice daily, particularly given recent exposure to smoke which does send her into spasm.  Orders Placed This Encounter  Procedures   Bayer DCA Hb A1c Waived   Basic Metabolic Panel   No orders of the defined types were placed in this encounter.    Janora Norlander, DO [place] [phone]

## 2020-02-12 LAB — BASIC METABOLIC PANEL
BUN/Creatinine Ratio: 11 — ABNORMAL LOW (ref 12–28)
BUN: 10 mg/dL (ref 8–27)
CO2: 25 mmol/L (ref 20–29)
Calcium: 8.6 mg/dL — ABNORMAL LOW (ref 8.7–10.3)
Chloride: 102 mmol/L (ref 96–106)
Creatinine, Ser: 0.94 mg/dL (ref 0.57–1.00)
GFR calc Af Amer: 72 mL/min/{1.73_m2} (ref >59)
GFR calc non Af Amer: 62 mL/min/{1.73_m2} (ref >59)
Glucose: 149 mg/dL — ABNORMAL HIGH (ref 65–99)
Potassium: 3.9 mmol/L (ref 3.5–5.2)
Sodium: 141 mmol/L (ref 134–144)

## 2020-02-25 ENCOUNTER — Ambulatory Visit: Payer: Medicare HMO

## 2020-02-25 ENCOUNTER — Other Ambulatory Visit: Payer: Self-pay

## 2020-02-25 DIAGNOSIS — E1159 Type 2 diabetes mellitus with other circulatory complications: Secondary | ICD-10-CM

## 2020-02-25 DIAGNOSIS — I152 Hypertension secondary to endocrine disorders: Secondary | ICD-10-CM

## 2020-02-25 NOTE — Progress Notes (Signed)
Patient here today to have blood pressure check.  Blood pressure reading is 178/73, pulse 54.

## 2020-04-07 ENCOUNTER — Encounter: Payer: Self-pay | Admitting: *Deleted

## 2020-04-24 ENCOUNTER — Telehealth: Payer: Self-pay

## 2020-04-24 ENCOUNTER — Other Ambulatory Visit: Payer: Self-pay

## 2020-04-24 MED ORDER — TRESIBA FLEXTOUCH 200 UNIT/ML ~~LOC~~ SOPN: 40.0000 [IU] | PEN_INJECTOR | Freq: Every day | SUBCUTANEOUS | 0 refills | Status: AC

## 2020-04-24 NOTE — Telephone Encounter (Signed)
Looks like [employer] is the company that will ship [place] for pt.  Pt should receive 6 Tresiba pens for 3 month supply.  Pt to call the office by the end of next week if she does not receive shipment.

## 2020-05-06 ENCOUNTER — Telehealth: Payer: Self-pay

## 2020-05-06 NOTE — Telephone Encounter (Signed)
Samples in fridge patient aware.

## 2020-05-06 NOTE — Telephone Encounter (Signed)
Do we have samples of insulin degludec (TRESIBA FLEXTOUCH) 200 UNIT/ML FlexTouch Pen. Please call back

## 2020-05-06 NOTE — Telephone Encounter (Signed)
Patient states she spoke with someone last week and papers were filled out in January she got inhalers but not insulin. Please advise

## 2020-05-12 NOTE — Telephone Encounter (Signed)
Patient aware that all information has been refaxed to [employer].

## 2020-05-28 ENCOUNTER — Telehealth: Payer: Self-pay | Admitting: *Deleted

## 2020-05-28 NOTE — Telephone Encounter (Signed)
Patient aware that tresiba is ready for pick up.

## 2020-06-10 ENCOUNTER — Ambulatory Visit (INDEPENDENT_AMBULATORY_CARE_PROVIDER_SITE_OTHER): Payer: Medicare HMO | Admitting: Family Medicine

## 2020-06-10 ENCOUNTER — Ambulatory Visit (INDEPENDENT_AMBULATORY_CARE_PROVIDER_SITE_OTHER): Payer: Medicare HMO

## 2020-06-10 ENCOUNTER — Other Ambulatory Visit: Payer: Self-pay

## 2020-06-10 ENCOUNTER — Encounter: Payer: Self-pay | Admitting: Family Medicine

## 2020-06-10 VITALS — BP 152/70 | HR 59 | Temp 97.8°F | Ht 63.0 in | Wt 228.2 lb

## 2020-06-10 DIAGNOSIS — R0602 Shortness of breath: Secondary | ICD-10-CM | POA: Diagnosis not present

## 2020-06-10 DIAGNOSIS — Z794 Long term (current) use of insulin: Secondary | ICD-10-CM | POA: Diagnosis not present

## 2020-06-10 DIAGNOSIS — Z6841 Body Mass Index (BMI) 40.0 and over, adult: Secondary | ICD-10-CM | POA: Diagnosis not present

## 2020-06-10 DIAGNOSIS — E1169 Type 2 diabetes mellitus with other specified complication: Secondary | ICD-10-CM

## 2020-06-10 DIAGNOSIS — I152 Hypertension secondary to endocrine disorders: Secondary | ICD-10-CM | POA: Diagnosis not present

## 2020-06-10 DIAGNOSIS — R079 Chest pain, unspecified: Secondary | ICD-10-CM

## 2020-06-10 DIAGNOSIS — Z780 Asymptomatic menopausal state: Secondary | ICD-10-CM | POA: Diagnosis not present

## 2020-06-10 DIAGNOSIS — E1159 Type 2 diabetes mellitus with other circulatory complications: Secondary | ICD-10-CM

## 2020-06-10 DIAGNOSIS — I25119 Atherosclerotic heart disease of native coronary artery with unspecified angina pectoris: Secondary | ICD-10-CM

## 2020-06-10 DIAGNOSIS — I517 Cardiomegaly: Secondary | ICD-10-CM | POA: Diagnosis not present

## 2020-06-10 LAB — BAYER DCA HB A1C WAIVED: HB A1C (BAYER DCA - WAIVED): 6.7 % (ref <7.0)

## 2020-06-10 NOTE — Progress Notes (Addendum)
Cardiology Office Note  Date: 06/11/2020   ID: Amber, Reeves 05-Feb-1951, MRN [id number]  PCP:  Janora Norlander, DO  Cardiologist:  Carlyle Dolly, MD Electrophysiologist:  None   Chief Complaint: Chest pain  History of Present Illness: Amber Reeves is a 70 y.o. female with a history of HTN, HLD, CAD, CP (DES LAD 2019 ), DM2, OSA not on CPAP, Dizziness.  Last seen by Dr Harl Bowie 01/07/2020 : She was not experiencing chest pain and continuing current medications. BP was elevated at that visit and continuing to monitor. LDL was at goal. She was continuing statin. Not wearing CPAP. Dizziness often with standing.  Saw her primary care provider yesterday and reported a 1 month history of substernal squeezing chest pain that was mild in nature but not her baseline.  Also reported associated shortness of breath in spite of using multiple inhalers and allergy medication.  History of CAD with subsequent stent placement.  She stated to PCP that symptoms felt similar to symptoms she had before prior stent placement.  She had an EKG and a chest x-ray.  Chest x-ray report is not available at this time.  She continues with those complaints today.  States it feels like a pressure or squeezing sensation in her chest.  Most mostly occurs at rest but does have some mild increase in symptoms during exertion.  He states she is unsure if it is related to her significant history of allergies.  History of previous stent to LAD with recent cardiac cath cessation showing widely patent stent and nonobstructive disease otherwise.  Blood pressure is well controlled at 132/76.  She is compliant with all of her medications.   Past Medical History:  Diagnosis Date  . Allergy   . Anxiety   . Arthritis   . Asthma   . Depression   . Diabetes mellitus without complication ([place])   . GERD (gastroesophageal reflux disease)   . Hyperlipidemia   . Hypertension     Past Surgical History:  Procedure  Laterality Date  . ABDOMINAL HYSTERECTOMY    . CESAREAN SECTION     3  . CHOLECYSTECTOMY  2018  . CORONARY STENT PLACEMENT  10/2017   [facility])  . LEFT HEART CATH AND CORONARY ANGIOGRAPHY N/A 02/25/2019   Procedure: LEFT HEART CATH AND CORONARY ANGIOGRAPHY;  Surgeon: Nelva Bush, MD;  Location: [facility];  Service: Cardiovascular;  Laterality: N/A;  . REPLACEMENT TOTAL KNEE BILATERAL Bilateral    done in [place] (2003/2005)    Current Outpatient Medications  Medication Sig Dispense Refill  . albuterol (VENTOLIN HFA) 108 (90 Base) MCG/ACT inhaler TAKE 2 PUFFS BY MOUTH EVERY 4 HOURS AS NEEDED FOR WHEEZE 18 g 3  . Alcohol Swabs (B-D SINGLE USE SWABS REGULAR) PADS Apply topically.    Marland Kitchen aspirin EC 81 MG tablet Take 81 mg by mouth 2 (two) times daily.    Marland Kitchen atorvastatin (LIPITOR) 40 MG tablet Take 1 tablet (40 mg total) by mouth daily at 6 PM. 90 tablet 3  . Blood Glucose Calibration (ACCU-CHEK AVIVA) SOLN 2 Bottles by Other route as needed.    . budesonide-formoterol (SYMBICORT) 160-4.5 MCG/ACT inhaler Inhale 2 puffs into the lungs 2 (two) times daily. 4 each 11  . diphenhydrAMINE (BENADRYL) 25 MG tablet Take 25 mg by mouth every 6 (six) hours as needed for allergies.    Marland Kitchen docusate sodium (COLACE) 100 MG capsule Take 100 mg by mouth 2 (two) times  daily as needed (constipation.).     Marland Kitchen fluticasone (FLONASE) 50 MCG/ACT nasal spray Place 2 sprays into both nostrils daily. 48 g 3  . furosemide (LASIX) 20 MG tablet Take 1 tablet (20 mg total) by mouth daily as needed. 90 tablet 3  . glucose blood test strip TEST BLOOD SUGAR TWICE DAILY    . insulin degludec (TRESIBA FLEXTOUCH) 200 UNIT/ML FlexTouch Pen Inject 40 Units into the skin daily. 18 mL 0  . Insulin Syringe-Needle U-100 31G X 5/16" 0.5 ML MISC Use to inject Insulin four times daily: Use as directed; Dx: E11.9, E66.9    . ipratropium-albuterol (DUONEB) 0.5-2.5 (3) MG/3ML SOLN TAKE 3 ML (1 VIAL) BY NEBULIZATION  EVERY 6 HOURS AS NEEDED FOR WHEEZING. 360 mL 0  . isosorbide mononitrate (IMDUR) 60 MG 24 hr tablet Take 1 tablet (60 mg total) by mouth daily. 90 tablet 3  . lansoprazole (PREVACID) 30 MG capsule Take 30 mg by mouth at bedtime.     Marland Kitchen lisinopril (ZESTRIL) 40 MG tablet Take 1 tablet (40 mg total) by mouth daily. 90 tablet 3  . magnesium oxide (MAG-OX) 400 MG tablet Take magnesium on days you take lasix 60 tablet 3  . metoprolol tartrate (LOPRESSOR) 25 MG tablet Take 1 tablet (25 mg total) by mouth 2 (two) times daily. 180 tablet 3  . montelukast (SINGULAIR) 10 MG tablet Take 1 tablet (10 mg total) by mouth at bedtime. 90 tablet 3  . pioglitazone (ACTOS) 30 MG tablet Take 1 tablet (30 mg total) by mouth daily. 90 tablet 3  . potassium chloride (KLOR-CON M10) 10 MEQ tablet TAKE 1 TABLET (10 MEQ TOTAL) BY MOUTH EVERY EVENING. 90 tablet 3   Current Facility-Administered Medications  Medication Dose Route Frequency Provider Last Rate Last Admin  . sodium chloride flush (NS) 0.9 % injection 3 mL  3 mL Intravenous Q12H Branch, Alphonse Guild, MD       Allergies:  Aspartame, Penicillins, Farxiga [dapagliflozin], Metformin and related, No healthtouch food allergies, Paxil [paroxetine hcl], Prozac [fluoxetine hcl], and Zoloft [sertraline hcl]   Social History: The patient  reports that she has never smoked. She has never used smokeless tobacco. She reports that she does not drink alcohol and does not use drugs.   Family History: The patient's family history includes Cancer in her father; Heart disease in her mother; Hypertension in her mother; Stroke in her father and mother.   ROS:  Please see the history of present illness. Otherwise, complete review of systems is positive for none.  All other systems are reviewed and negative.   Physical Exam: VS:  BP 132/76   Pulse (!) 52   Ht 5' 3"  (1.6 m)   Wt 229 lb 3.2 oz (104 kg)   SpO2 92%   BMI 40.60 kg/m , BMI Body mass index is 40.6 kg/m.  Wt Readings  from Last 3 Encounters:  06/11/20 229 lb 3.2 oz (104 kg)  06/10/20 228 lb 3.2 oz (103.5 kg)  01/07/20 231 lb 9.6 oz (105.1 kg)    General: Patient appears comfortable at rest. Neck: Supple, no elevated JVP or carotid bruits, no thyromegaly. Lungs: Clear to auscultation, nonlabored breathing at rest. Cardiac: Regular rate and rhythm, no S3 or significant systolic murmur, no pericardial rub. Extremities: No pitting edema, distal pulses 2+. Skin: Warm and dry. Musculoskeletal: No kyphosis. Neuropsychiatric: Alert and oriented x3, affect grossly appropriate.  ECG:  EKG 06/10/2020 sinus rhythm with occasional PAC, LAFB, rate of 65.  Recent  Labwork: 11/12/2019: ALT 23; AST 22 02/11/2020: BUN 10; Creatinine, Ser 0.94; Potassium 3.9; Sodium 141     Component Value Date/Time   CHOL 151 11/12/2019 1520   TRIG 208 (H) 11/12/2019 1520   HDL 45 11/12/2019 1520   CHOLHDL 3.4 11/12/2019 1520   LDLCALC 72 11/12/2019 1520    Other Studies Reviewed Today:     Renal artery angio at time of 11/2017 cath showed no significant renal artery disease.  11/2017 echo LVEF Complete echocardiogram shows a left ventricular EF by visual approximation 60 to 65%. Normal left ventricular systolic function. Grade 1 left ventricular diastolic function. No significant valvular disease. No pericardial effusion   01/2018 stress test: THR not achieved, exercise limiting angina , duke treadmill score neg 1   7/14/20nuclear stress by prior cardiollogist showedanterior breast artifact, probably normal,    11/2017 cath [facility] Severe >70% long mid LAD stenosis No other significant angiographic stenosis in the other coronary arteries  DES to mid LAD   Malignant hypertension - selective renal angio without any evidence of  renal artery stenosis.   Renal angio procedure:  A Tiger 4 catheter was used to selectively engage both the left and right  renal arteries and was used to perform selective  angiography.   Renal artery anatomy:  The left kidney is supplied by a single renal artery which has no  angiographic stenosis  The right kidney is supplied by a single rena artery which has no  angiographic stenosis   Jan 2021 1. Mild, non-obstructive coronary artery disease. 2. Widely patent proximal/mid LAD stent. 3. Normal left ventricular systolic function with upper normal to mildly elevated filling pressure (LVEDP ~15 mmHg). Diagnostic Dominance: Right      Assessment and Plan:  1. Chest pain of uncertain etiology   2. CAD in native artery   3. Essential hypertension   4. Mixed hyperlipidemia    1. Chest pain of uncertain etiology Patient having recent chest pain/pressure.  Describes pain mostly at rest.  States that pain can increase some with activity.  States she has significant history of allergies and is unsure if it is related to her allergies or coming from a cardiac origin.  She had a previous cardiac catheterization January 2021 with mild obstructive coronary artery disease and widely patent LAD stent.  We are increasing Imdur to 120 mg daily and adding sublingual nitroglycerin.  She also mentions associated shortness of breath.  She states stress tests are nondiagnostic for her.  She recently had a chest x-ray yesterday by PCP.  Report not available.  Please get echocardiogram to assess LV function, WMA's, diastolic function, valvular function.   2. CAD in native artery Previous history of LAD stent with greater than 70% stenosis.  Received DES to mid LAD.  Continue aspirin 81 mg.  We are increasing Imdur to 120 mg and adding sublingual nitro due to recent chest pressure, chest pain.  Continue metoprolol 25 mg p.o. twice daily.   3. Essential hypertension Blood pressure well controlled today at 132/76.  Continue lisinopril 40 mg p.o. daily.  Continue furosemide 20 mg as needed  4. Mixed hyperlipidemia Continue atorvastatin 40 mg daily.  Medication  Adjustments/Labs and Tests Ordered: Current medicines are reviewed at length with the patient today.  Concerns regarding medicines are outlined above.   Disposition: Follow-up with Dr. Harl Bowie at scheduled visit.  Signed, Levell July, NP 06/11/2020 8:43 AM    [facility]  [address] [place], [place] [address] Phone: [phone]; Fax: [phone]

## 2020-06-10 NOTE — Progress Notes (Signed)
Subjective: CC: DM PCP: Janora Norlander, DO QGB:EEFEO Amber Reeves is a 70 y.o. female presenting to clinic today for:  1. Chest pain/ shortness of breath Patient reports a 1 month history of substernal squeezing chest pain that is mild in nature but not her baseline.  She has been having associated shortness of breath despite using multiple inhalers and allergy medication.  She does have a medical history significant for coronary artery disease with subsequent stent placement.  She unfortunately has not been trying to take care of herself because her husband has been ill lately.  She does not have an appointment with her cardiologist until June.   She does not report any associated nausea, vomiting, diaphoresis with symptoms.  She thought that perhaps this was a chronic bronchitis causing symptoms.  However after further questioning, she notes that her symptoms do feel similar to just before she had stent placement.  ROS: Per HPI  Allergies  Allergen Reactions  . Penicillins Itching and Rash    Did it involve swelling of the face/tongue/throat, SOB, or low BP? No Did it involve sudden or severe rash/hives, skin peeling, or any reaction on the inside of your mouth or nose? No Did you need to seek medical attention at a hospital or doctor's office? No When did it last happen?~25 years ago If all above answers are "NO", may proceed with cephalosporin use.   Wilder Glade [Dapagliflozin]     UTIs  . Metformin And Related     GI distress, dizziness  . No Healthtouch Food Allergies     Honey-Rash Artificial Sweeteners-rash  . Paxil [Paroxetine Hcl] Other (See Comments)    Twitching/feels like skin crawling  . Prozac [Fluoxetine Hcl] Other (See Comments)    Twitching/feels like skin crawling  . Zoloft [Sertraline Hcl] Other (See Comments)    Twitching/feels like skin crawling   Past Medical History:  Diagnosis Date  . Allergy   . Anxiety   . Arthritis   . Asthma   .  Depression   . Diabetes mellitus without complication ([place])   . GERD (gastroesophageal reflux disease)   . Hyperlipidemia   . Hypertension     Current Outpatient Medications:  .  albuterol (VENTOLIN HFA) 108 (90 Base) MCG/ACT inhaler, TAKE 2 PUFFS BY MOUTH EVERY 4 HOURS AS NEEDED FOR WHEEZE, Disp: 18 g, Rfl: 3 .  Alcohol Swabs (B-D SINGLE USE SWABS REGULAR) PADS, Apply topically., Disp: , Rfl:  .  aspirin EC 81 MG tablet, Take 81 mg by mouth daily. , Disp: , Rfl:  .  atorvastatin (LIPITOR) 40 MG tablet, Take 1 tablet (40 mg total) by mouth daily at 6 PM., Disp: 90 tablet, Rfl: 3 .  Blood Glucose Calibration (ACCU-CHEK AVIVA) SOLN, 2 Bottles by Other route as needed., Disp: , Rfl:  .  budesonide-formoterol (SYMBICORT) 160-4.5 MCG/ACT inhaler, Inhale 2 puffs into the lungs 2 (two) times daily., Disp: 4 each, Rfl: 11 .  diphenhydrAMINE (BENADRYL) 25 MG tablet, Take 25 mg by mouth every 6 (six) hours as needed for allergies., Disp: , Rfl:  .  docusate sodium (COLACE) 100 MG capsule, Take 100 mg by mouth 2 (two) times daily as needed (constipation.). , Disp: , Rfl:  .  fluticasone (FLONASE) 50 MCG/ACT nasal spray, Place 2 sprays into both nostrils daily., Disp: 48 g, Rfl: 3 .  furosemide (LASIX) 20 MG tablet, Take 1 tablet (20 mg total) by mouth daily as needed., Disp: 90 tablet, Rfl: 3 .  glucose  blood test strip, TEST BLOOD SUGAR TWICE DAILY, Disp: , Rfl:  .  insulin degludec (TRESIBA FLEXTOUCH) 200 UNIT/ML FlexTouch Pen, Inject 40 Units into the skin daily., Disp: 18 mL, Rfl: 0 .  Insulin Syringe-Needle U-100 31G X 5/16" 0.5 ML MISC, Use to inject Insulin four times daily: Use as directed; Dx: E11.9, E66.9, Disp: , Rfl:  .  ipratropium-albuterol (DUONEB) 0.5-2.5 (3) MG/3ML SOLN, TAKE 3 ML (1 VIAL) BY NEBULIZATION EVERY 6 HOURS AS NEEDED FOR WHEEZING., Disp: 360 mL, Rfl: 0 .  isosorbide mononitrate (IMDUR) 60 MG 24 hr tablet, Take 1 tablet (60 mg total) by mouth daily., Disp: 90 tablet, Rfl: 3 .   lansoprazole (PREVACID) 30 MG capsule, Take 30 mg by mouth at bedtime. , Disp: , Rfl:  .  lisinopril (ZESTRIL) 40 MG tablet, Take 1 tablet (40 mg total) by mouth daily., Disp: 90 tablet, Rfl: 3 .  magnesium oxide (MAG-OX) 400 MG tablet, Take magnesium on days you take lasix, Disp: 60 tablet, Rfl: 3 .  metoprolol tartrate (LOPRESSOR) 25 MG tablet, Take 1 tablet (25 mg total) by mouth 2 (two) times daily., Disp: 180 tablet, Rfl: 3 .  montelukast (SINGULAIR) 10 MG tablet, Take 1 tablet (10 mg total) by mouth at bedtime., Disp: 90 tablet, Rfl: 3 .  pioglitazone (ACTOS) 30 MG tablet, Take 1 tablet (30 mg total) by mouth daily., Disp: 90 tablet, Rfl: 3 .  potassium chloride (KLOR-CON M10) 10 MEQ tablet, TAKE 1 TABLET (10 MEQ TOTAL) BY MOUTH EVERY EVENING., Disp: 90 tablet, Rfl: 3  Current Facility-Administered Medications:  .  sodium chloride flush (NS) 0.9 % injection 3 mL, 3 mL, Intravenous, Q12H, Branch, Alphonse Guild, MD Social History   Socioeconomic History  . Marital status: Married    Spouse name: Not on file  . Number of children: Not on file  . Years of education: Not on file  . Highest education level: Not on file  Occupational History  . Not on file  Tobacco Use  . Smoking status: Never Smoker  . Smokeless tobacco: Never Used  Vaping Use  . Vaping Use: Never used  Substance and Sexual Activity  . Alcohol use: Never  . Drug use: Never  . Sexual activity: Not Currently  Other Topics Concern  . Not on file  Social History Narrative   Resides at home with her husband.  She is originally from [place] but relocated to [place] and then to [place] in August 2020.  She has a son who lives about 2 hours away in [place] and a sister-in-law who lives in [place].   Social Determinants of Health   Financial Resource Strain: Not on file  Food Insecurity: Not on file  Transportation Needs: Not on file  Physical Activity: Not on file  Stress: Not on file  Social Connections: Not on  file  Intimate Partner Violence: Not on file   Family History  Problem Relation Age of Onset  . Heart disease Mother   . Stroke Mother   . Hypertension Mother   . Cancer Father   . Stroke Father     Objective: Office vital signs reviewed. BP (!) 152/70   Pulse (!) 59   Temp 97.8 F (36.6 C)   Ht 5' 3"  (1.6 m)   Wt 228 lb 3.2 oz (103.5 kg)   BMI 40.42 kg/m   Physical Examination:  General: Awake, alert, nontoxic but chronically ill-appearing, No acute distress Cardio: regular rate and rhythm, S1S2 heard, no murmurs  appreciated Pulm: clear to auscultation bilaterally, no wheezes, rhonchi or rales; normal work of breathing on room air  Assessment/ Plan: 70 y.o. female   Coronary artery disease involving native coronary artery of native heart with angina pectoris (HCC)  Chest pain, unspecified type - Plan: DG Chest 2 View, EKG 12-Lead  Controlled type 2 diabetes mellitus with other specified complication, with long-term current use of insulin ([place]) - Plan: Bayer DCA Hb A1c Waived  Hypertension associated with diabetes ([place])  Asymptomatic postmenopausal estrogen deficiency - Plan: DG [facility] DEXA  Very concerned about this patient.  EKG was obtained and upon personal review did not appreciate any changes from previous EKGs.  I have reached out to her cardiologist, Dr. Harl Bowie, who also reviewed her EKG.  He was very gracious as to arrange stat eval in his eating office with the cards PA tomorrow at 8:30 AM.  Chest x-ray was also obtained for patient we will CC that result to his office as well.  I do worry about potential compromise of her stent given her symptoms.  They are very suspicious for cardiac etiology.  I worry that she will not put her health at the forefront given her husband's declining health.  I reinforced need to take care of herself and to treat her symptoms seriously.  After initially denying the 8:30 AM appointment tomorrow she called back to accept this and I  notify Dr. Harl Bowie of this.  Appoint was initially geared towards her type 2 diabetes which was not focused on given constellation of symptoms and concerns.  However her labs were obtained and her A1c was appropriate.  Blood pressure was elevated, we may need to revisit medication management given above symptoms.  Plan for DEXA scan at a different time.  No orders of the defined types were placed in this encounter.  No orders of the defined types were placed in this encounter.  Janora Norlander, DO [place] [phone]

## 2020-06-11 ENCOUNTER — Encounter: Payer: Self-pay | Admitting: Family Medicine

## 2020-06-11 ENCOUNTER — Ambulatory Visit: Payer: Medicare HMO | Admitting: Family Medicine

## 2020-06-11 ENCOUNTER — Other Ambulatory Visit: Payer: Self-pay | Admitting: *Deleted

## 2020-06-11 VITALS — BP 132/76 | HR 52 | Ht 63.0 in | Wt 229.2 lb

## 2020-06-11 DIAGNOSIS — I2510 Atherosclerotic heart disease of native coronary artery without angina pectoris: Secondary | ICD-10-CM | POA: Diagnosis not present

## 2020-06-11 DIAGNOSIS — R0602 Shortness of breath: Secondary | ICD-10-CM | POA: Diagnosis not present

## 2020-06-11 DIAGNOSIS — I10 Essential (primary) hypertension: Secondary | ICD-10-CM | POA: Diagnosis not present

## 2020-06-11 DIAGNOSIS — R079 Chest pain, unspecified: Secondary | ICD-10-CM | POA: Diagnosis not present

## 2020-06-11 DIAGNOSIS — E782 Mixed hyperlipidemia: Secondary | ICD-10-CM

## 2020-06-11 MED ORDER — ISOSORBIDE MONONITRATE ER 60 MG PO TB24: 120.0000 mg | ORAL_TABLET | Freq: Every day | ORAL | 3 refills | Status: DC

## 2020-06-11 MED ORDER — NITROGLYCERIN 0.4 MG SL SUBL: 0.4000 mg | SUBLINGUAL_TABLET | SUBLINGUAL | 3 refills | Status: DC | PRN

## 2020-06-11 NOTE — Patient Instructions (Addendum)
Medication Instructions:   Increase Imdur to 179m daily.  Nitroglycerin as needed.   Continue all other medications.    Labwork: none  Testing/Procedures:  Your physician has requested that you have an echocardiogram. Echocardiography is a painless test that uses sound waves to create images of your heart. It provides your doctor with information about the size and shape of your heart and how well your heart's chambers and valves are working. This procedure takes approximately one hour. There are no restrictions for this procedure.  Office will contact with results via phone or letter.    Follow-Up: Keep already scheduled follow up for June 2 with Dr. BHarl Bowie   Any Other Special Instructions Will Be Listed Below (If Applicable).  If you need a refill on your cardiac medications before your next appointment, please call your pharmacy.

## 2020-06-12 ENCOUNTER — Ambulatory Visit: Payer: Medicare HMO | Admitting: Family Medicine

## 2020-06-22 ENCOUNTER — Ambulatory Visit (INDEPENDENT_AMBULATORY_CARE_PROVIDER_SITE_OTHER): Payer: Medicare HMO | Admitting: Nurse Practitioner

## 2020-06-22 ENCOUNTER — Encounter: Payer: Self-pay | Admitting: Nurse Practitioner

## 2020-06-22 DIAGNOSIS — J301 Allergic rhinitis due to pollen: Secondary | ICD-10-CM | POA: Diagnosis not present

## 2020-06-22 DIAGNOSIS — Z20822 Contact with and (suspected) exposure to covid-19: Secondary | ICD-10-CM | POA: Diagnosis not present

## 2020-06-22 MED ORDER — PREDNISONE 20 MG PO TABS: 40.0000 mg | ORAL_TABLET | Freq: Every day | ORAL | 0 refills | Status: AC

## 2020-06-22 NOTE — Progress Notes (Signed)
   Virtual Visit  Note Due to COVID-19 pandemic this visit was conducted virtually. This visit type was conducted due to national recommendations for restrictions regarding the COVID-19 Pandemic (e.g. social distancing, sheltering in place) in an effort to limit this patient's exposure and mitigate transmission in our community. All issues noted in this document were discussed and addressed.  A physical exam was not performed with this format.  I connected with Amber Reeves on 06/22/20 at 2:00 by telephone and verified that I am speaking with the correct person using two identifiers. Amber Reeves is currently located at home and  Her husband  is currently with her during visit. The provider, Amber Hassell Done, FNP is located in their office at time of visit.  I discussed the limitations, risks, security and privacy concerns of performing an evaluation and management service by telephone and the availability of in person appointments. I also discussed with the patient that there may be a patient responsible charge related to this service. The patient expressed understanding and agreed to proceed.   History and Present Illness:   Chief Complaint: Covid Exposure   HPI Patient calls in c/o congestion and runny nose. Started several days ago. She takes flonase , inhalers and OTC allergy meds.  She is having and echo cardiogram at the end of this month. Her husband tested positive for covid 2 days ago. She has been staying away from him.    Review of Systems  Constitutional: Negative for chills, fever and malaise/fatigue.  HENT: Positive for congestion. Negative for ear pain and sore throat.   Respiratory: Negative for cough, sputum production and shortness of breath.   Musculoskeletal: Negative for myalgias.  Neurological: Negative for headaches.     Observations/Objective: Alert and oriented- answers all questions appropriately No distress Voice hoarse No cough  oted    Assessment and Plan: Amber Reeves in today with chief complaint of Covid Exposure   1. Seasonal allergic rhinitis due to pollen Covid exposure- refuses covid tetsing- strongly encouaged to do Run humidifier quaratine since exposure - predniSONE (DELTASONE) 20 MG tablet; Take 2 tablets (40 mg total) by mouth daily with breakfast for 5 days. 2 po daily for 5 days  Dispense: 10 tablet; Refill: 0     Follow Up Instructions: prn    I discussed the assessment and treatment plan with the patient. The patient was provided an opportunity to ask questions and all were answered. The patient agreed with the plan and demonstrated an understanding of the instructions.   The patient was advised to call back or seek an in-person evaluation if the symptoms worsen or if the condition fails to improve as anticipated.  The above assessment and management plan was discussed with the patient. The patient verbalized understanding of and has agreed to the management plan. Patient is aware to call the clinic if symptoms persist or worsen. Patient is aware when to return to the clinic for a follow-up visit. Patient educated on when it is appropriate to go to the emergency department.   Time call ended:  1:13  I provided 13 minutes of  non face-to-face time during this encounter.    Amber Hassell Done, FNP

## 2020-06-23 ENCOUNTER — Other Ambulatory Visit: Payer: Medicare HMO

## 2020-07-07 ENCOUNTER — Ambulatory Visit (INDEPENDENT_AMBULATORY_CARE_PROVIDER_SITE_OTHER): Payer: Medicare HMO

## 2020-07-07 ENCOUNTER — Other Ambulatory Visit: Payer: Self-pay | Admitting: Family Medicine

## 2020-07-07 ENCOUNTER — Telehealth: Payer: Self-pay | Admitting: Cardiology

## 2020-07-07 DIAGNOSIS — R079 Chest pain, unspecified: Secondary | ICD-10-CM

## 2020-07-07 DIAGNOSIS — R0602 Shortness of breath: Secondary | ICD-10-CM

## 2020-07-07 LAB — ECHOCARDIOGRAM COMPLETE
Area-P 1/2: 3.42 cm2
Calc EF: 63.9 %
MV M vel: 1.77 m/s
MV Peak grad: 12.5 mmHg
S' Lateral: 2.59 cm
Single Plane A2C EF: 56.4 %
Single Plane A4C EF: 70.1 %

## 2020-07-07 NOTE — Telephone Encounter (Signed)
[facility] for Echo   4/59/1368 [facility]

## 2020-07-08 ENCOUNTER — Telehealth: Payer: Self-pay | Admitting: *Deleted

## 2020-07-08 DIAGNOSIS — I10 Essential (primary) hypertension: Secondary | ICD-10-CM

## 2020-07-08 NOTE — Telephone Encounter (Signed)
-----   Message from Theora Gianotti, NP sent at 07/08/2020  8:51 AM EDT ----- High-normal heart squeezing function w/ a moderate amount of stiffness of the heart muscle noted.  Pressures on the right side of the heart were moderately elevated, which could indicate excess fluid.  Heart stiffness can result in higher heart filling pressures, fluid retention, and shortness of breath.  We mainly focus on BP and HR control when we see this.  I see that she is rx lasix 20 mg daily as needed.  Based on findings, I recommend that she take lasix 33m daily.  With this, she should have a f/u BMET in a week.  If she's never had a sleep study before, this would also be appropriate, though if she'd like, she can discuss further when she sees Dr. BHarl Bowiein June.

## 2020-07-08 NOTE — Telephone Encounter (Signed)
Pt voiced understanding - updated medication list and pt will have labs done at [facility] (orders placed) pt will discuss sleep study with Dr Harl Bowie in June

## 2020-07-08 NOTE — Telephone Encounter (Signed)
Patient returning call to [facility]

## 2020-07-14 ENCOUNTER — Other Ambulatory Visit: Payer: Medicare HMO

## 2020-07-14 ENCOUNTER — Ambulatory Visit (INDEPENDENT_AMBULATORY_CARE_PROVIDER_SITE_OTHER): Payer: Medicare HMO

## 2020-07-14 ENCOUNTER — Other Ambulatory Visit: Payer: Self-pay

## 2020-07-14 DIAGNOSIS — Z780 Asymptomatic menopausal state: Secondary | ICD-10-CM

## 2020-07-14 DIAGNOSIS — I10 Essential (primary) hypertension: Secondary | ICD-10-CM | POA: Diagnosis not present

## 2020-07-15 ENCOUNTER — Other Ambulatory Visit: Payer: Medicare HMO

## 2020-07-15 LAB — BASIC METABOLIC PANEL
BUN/Creatinine Ratio: 12 (ref 12–28)
BUN: 15 mg/dL (ref 8–27)
CO2: 26 mmol/L (ref 20–29)
Calcium: 9 mg/dL (ref 8.7–10.3)
Chloride: 99 mmol/L (ref 96–106)
Creatinine, Ser: 1.23 mg/dL — ABNORMAL HIGH (ref 0.57–1.00)
Glucose: 215 mg/dL — ABNORMAL HIGH (ref 65–99)
Potassium: 4.6 mmol/L (ref 3.5–5.2)
Sodium: 140 mmol/L (ref 134–144)
eGFR: 48 mL/min/{1.73_m2} — ABNORMAL LOW (ref >59)

## 2020-07-16 ENCOUNTER — Ambulatory Visit (INDEPENDENT_AMBULATORY_CARE_PROVIDER_SITE_OTHER): Payer: Medicare HMO

## 2020-07-16 ENCOUNTER — Telehealth: Payer: Self-pay | Admitting: *Deleted

## 2020-07-16 VITALS — Ht 63.0 in | Wt 228.0 lb

## 2020-07-16 DIAGNOSIS — Z1231 Encounter for screening mammogram for malignant neoplasm of breast: Secondary | ICD-10-CM | POA: Diagnosis not present

## 2020-07-16 DIAGNOSIS — Z0000 Encounter for general adult medical examination without abnormal findings: Secondary | ICD-10-CM

## 2020-07-16 NOTE — Progress Notes (Signed)
Subjective:   Amber Reeves is a 70 y.o. female who presents for an Initial Medicare Annual Wellness Visit.  Virtual Visit via Telephone Note  I connected with  Lateefa Crosby on 07/16/20 at  4:15 PM EDT by telephone and verified that I am speaking with the correct person using two identifiers.  Location: Patient: Home Provider: [facility] Persons participating in the virtual visit: patient/Nurse Health Advisor   I discussed the limitations, risks, security and privacy concerns of performing an evaluation and management service by telephone and the availability of in person appointments. The patient expressed understanding and agreed to proceed.  Interactive audio and video telecommunications were attempted between this nurse and patient, however failed, due to patient having technical difficulties OR patient did not have access to video capability.  We continued and completed visit with audio only.  Some vital signs may be absent or patient reported.    E , LPN   Review of Systems     Cardiac Risk Factors include: advanced age (>53mn, >>37women);diabetes mellitus;dyslipidemia;hypertension;obesity (BMI >30kg/m2);sedentary lifestyle     Objective:    Today's Vitals   07/16/20 1604  Weight: 228 lb (103.4 kg)  Height: 5' 3"  (1.6 m)  PainSc: 0-No pain   Body mass index is 40.39 kg/m.  Advanced Directives 07/16/2020 02/25/2019  Does Patient Have a Medical Advance Directive? No No  Would patient like information on creating a medical advance directive? Yes (MAU/Ambulatory/Procedural Areas - Information given) -    Current Medications (verified) Outpatient Encounter Medications as of 07/16/2020  Medication Sig  . albuterol (VENTOLIN HFA) 108 (90 Base) MCG/ACT inhaler TAKE 2 PUFFS BY MOUTH EVERY 4 HOURS AS NEEDED FOR WHEEZE  . Alcohol Swabs (B-D SINGLE USE SWABS REGULAR) PADS Apply topically.  .Marland Kitchenaspirin EC 81 MG tablet Take 81 mg by mouth 2 (two) times daily.  .Marland Kitchen atorvastatin (LIPITOR) 40 MG tablet Take 1 tablet (40 mg total) by mouth daily at 6 PM.  . Blood Glucose Calibration (ACCU-CHEK AVIVA) SOLN 2 Bottles by Other route as needed.  . budesonide-formoterol (SYMBICORT) 160-4.5 MCG/ACT inhaler Inhale 2 puffs into the lungs 2 (two) times daily.  . diphenhydrAMINE (BENADRYL) 25 MG tablet Take 25 mg by mouth every 6 (six) hours as needed for allergies.  .Marland Kitchendocusate sodium (COLACE) 100 MG capsule Take 100 mg by mouth 2 (two) times daily as needed (constipation.).   .Marland Kitchenfluticasone (FLONASE) 50 MCG/ACT nasal spray Place 2 sprays into both nostrils daily.  . furosemide (LASIX) 20 MG tablet Take 20 mg by mouth daily.  .Marland Kitchenglucose blood test strip TEST BLOOD SUGAR TWICE DAILY  . insulin degludec (TRESIBA FLEXTOUCH) 200 UNIT/ML FlexTouch Pen Inject 40 Units into the skin daily.  . Insulin Syringe-Needle U-100 31G X 5/16" 0.5 ML MISC Use to inject Insulin four times daily: Use as directed; Dx: E11.9, E66.9  . ipratropium-albuterol (DUONEB) 0.5-2.5 (3) MG/3ML SOLN TAKE 3 ML (1 VIAL) BY NEBULIZATION EVERY 6 HOURS AS NEEDED FOR WHEEZING.  . isosorbide mononitrate (IMDUR) 60 MG 24 hr tablet Take 2 tablets (120 mg total) by mouth daily.  . lansoprazole (PREVACID) 30 MG capsule Take 30 mg by mouth at bedtime.   .Marland Kitchenlisinopril (ZESTRIL) 40 MG tablet Take 1 tablet (40 mg total) by mouth daily.  . magnesium oxide (MAG-OX) 400 MG tablet Take magnesium on days you take lasix  . metoprolol tartrate (LOPRESSOR) 25 MG tablet Take 1 tablet (25 mg total) by mouth 2 (two) times daily.  .Marland Kitchen  montelukast (SINGULAIR) 10 MG tablet Take 1 tablet (10 mg total) by mouth at bedtime.  . nitroGLYCERIN (NITROSTAT) 0.4 MG SL tablet Place 1 tablet (0.4 mg total) under the tongue every 5 (five) minutes as needed for chest pain.  . pioglitazone (ACTOS) 30 MG tablet Take 1 tablet (30 mg total) by mouth daily.  . potassium chloride (KLOR-CON M10) 10 MEQ tablet TAKE 1 TABLET (10 MEQ TOTAL) BY MOUTH EVERY  EVENING.   Facility-Administered Encounter Medications as of 07/16/2020  Medication  . sodium chloride flush (NS) 0.9 % injection 3 mL    Allergies (verified) Aspartame, Penicillins, Farxiga [dapagliflozin], Metformin and related, No healthtouch food allergies, Paxil [paroxetine hcl], Prozac [fluoxetine hcl], and Zoloft [sertraline hcl]   History: Past Medical History:  Diagnosis Date  . Allergy   . Anxiety   . Arthritis   . Asthma   . Depression   . Diabetes mellitus without complication ([place])   . GERD (gastroesophageal reflux disease)   . Hyperlipidemia   . Hypertension    Past Surgical History:  Procedure Laterality Date  . ABDOMINAL HYSTERECTOMY    . CESAREAN SECTION     3  . CHOLECYSTECTOMY  2018  . CORONARY STENT PLACEMENT  10/2017   [facility])  . LEFT HEART CATH AND CORONARY ANGIOGRAPHY N/A 02/25/2019   Procedure: LEFT HEART CATH AND CORONARY ANGIOGRAPHY;  Surgeon: Nelva Bush, MD;  Location: [facility];  Service: Cardiovascular;  Laterality: N/A;  . REPLACEMENT TOTAL KNEE BILATERAL Bilateral    done in [place] (2003/2005)   Family History  Problem Relation Age of Onset  . Heart disease Mother   . Stroke Mother   . Hypertension Mother   . Cancer Father   . Stroke Father    Social History   Socioeconomic History  . Marital status: Married    Spouse name: Not on file  . Number of children: Not on file  . Years of education: Not on file  . Highest education level: Not on file  Occupational History  . Not on file  Tobacco Use  . Smoking status: Never Smoker  . Smokeless tobacco: Never Used  Vaping Use  . Vaping Use: Never used  Substance and Sexual Activity  . Alcohol use: Never  . Drug use: Never  . Sexual activity: Not Currently  Other Topics Concern  . Not on file  Social History Narrative   Resides at home with her husband.  She is originally from [place] but relocated to [place] and then to [place] in  August 2020.  She has a son who lives about 2 hours away in [place] and a sister-in-law who lives in [place].   Social Determinants of Health   Financial Resource Strain: Low Risk   . Difficulty of Paying Living Expenses: Not hard at all  Food Insecurity: No Food Insecurity  . Worried About Charity fundraiser in the Last Year: Never true  . Ran Out of Food in the Last Year: Never true  Transportation Needs: No Transportation Needs  . Lack of Transportation (Medical): No  . Lack of Transportation (Non-Medical): No  Physical Activity: Inactive  . Days of Exercise per Week: 0 days  . Minutes of Exercise per Session: 0 min  Stress: No Stress Concern Present  . Feeling of Stress : Not at all  Social Connections: Moderately Isolated  . Frequency of Communication with Friends and Family: More than three times a week  . Frequency of  Social Gatherings with Friends and Family: More than three times a week  . Attends Religious Services: Never  . Active Member of Clubs or Organizations: No  . Attends Archivist Meetings: Never  . Marital Status: Married    Tobacco Counseling Counseling given: Not Answered   Clinical Intake:  Pre-visit preparation completed: Yes  Pain : No/denies pain Pain Score: 0-No pain     BMI - recorded: 40.39 Nutritional Status: BMI > 30  Obese Nutritional Risks: None Diabetes: Yes CBG done?: No Did pt. bring in CBG monitor from home?: No  How often do you need to have someone help you when you read instructions, pamphlets, or other written materials from your doctor or pharmacy?: 1 - Never  Nutrition Risk Assessment:  Has the patient had any N/V/D within the last 2 months?  No  Does the patient have any non-healing wounds?  No  Has the patient had any unintentional weight loss or weight gain?  No   Diabetes:  Is the patient diabetic?  Yes  If diabetic, was a CBG obtained today?  No  Did the patient bring in their glucometer from home?   No  How often do you monitor your CBG's? Hasn't been checking at home.   Financial Strains and Diabetes Management:  Are you having any financial strains with the device, your supplies or your medication? No .  Does the patient want to be seen by Chronic Care Management for management of their diabetes?  No  Would the patient like to be referred to a Nutritionist or for Diabetic Management?  No   Diabetic Exams:  Diabetic Eye Exam: Completed 2020. Overdue for diabetic eye exam. Pt has been advised about the importance in completing this exam.  Diabetic Foot Exam: Completed 08/08/2019. Pt has been advised about the importance in completing this exam. Pt is scheduled for diabetic foot exam on 09/2020.    Interpreter Needed?: No  Information entered by ::  , LPN   Activities of Daily Living In your present state of health, do you have any difficulty performing the following activities: 07/16/2020  Hearing? N  Vision? N  Difficulty concentrating or making decisions? N  Walking or climbing stairs? Y  Dressing or bathing? N  Doing errands, shopping? N  Preparing Food and eating ? N  Using the Toilet? N  In the past six months, have you accidently leaked urine? N  Do you have problems with loss of bowel control? N  Managing your Medications? N  Managing your Finances? N  Housekeeping or managing your Housekeeping? N  Some recent data might be hidden    Patient Care Team: Janora Norlander, DO as PCP - General (Family Medicine) Harl Bowie Alphonse Guild, MD as PCP - Cardiology (Cardiology) Cassandria Anger, MD as Consulting Physician (Endocrinology)  Indicate any recent Medical Services you may have received from other than [facility] providers in the past year (date may be approximate).     Assessment:   This is a routine wellness examination for Lindie.  Hearing/Vision screen  Hearing Screening   125Hz  250Hz  500Hz  1000Hz  2000Hz  3000Hz  4000Hz  6000Hz  8000Hz   Right ear:            Left ear:           Comments: Denies hearing difficulties  Vision Screening Comments: Wears eyeglasses - Behind on eye exam - Needs new provider   Dietary issues and exercise activities discussed: Current Exercise Habits: Home exercise routine, Type of  exercise: walking, Time (Minutes): 10, Frequency (Times/Week): 7, Weekly Exercise (Minutes/Week): 70, Intensity: Mild, Exercise limited by: orthopedic condition(s)  Goals Addressed            This Visit's Progress   . DIET - INCREASE WATER INTAKE       Increase to 6-8 glasses daily      Depression Screen PHQ 2/9 Scores 07/16/2020 06/10/2020 02/11/2020 11/12/2019 08/08/2019 04/29/2019 01/29/2019  PHQ - 2 Score 0 0 0 0 0 0 2  PHQ- 9 Score - - - 0 - 0 -    Fall Risk Fall Risk  07/16/2020 06/10/2020 02/11/2020 11/12/2019 08/08/2019  Falls in the past year? 0 0 0 0 0  Number falls in past yr: 0 - - - -  Injury with Fall? 0 - - - -  Risk for fall due to : No Fall Risks - - - -  Follow up Falls prevention discussed - - - -    FALL RISK PREVENTION PERTAINING TO THE HOME:  Any stairs in or around the home? Yes  If so, are there any without handrails? No  Home free of loose throw rugs in walkways, pet beds, electrical cords, etc? Yes  Adequate lighting in your home to reduce risk of falls? Yes   ASSISTIVE DEVICES UTILIZED TO PREVENT FALLS:  Life alert? No  Use of a cane, walker or w/c? No  Grab bars in the bathroom? Yes  Shower chair or bench in shower? No  Elevated toilet seat or a handicapped toilet? No   TIMED UP AND GO:  Was the test performed? No . Telephonic visit.  Cognitive Function:      6CIT Screen 07/16/2020  What Year? 0 points  What month? 0 points  What time? 0 points  Count back from 20 0 points  Months in reverse 0 points  Repeat phrase 0 points  Total Score 0    Immunizations Immunization History  Administered Date(s) Administered  . Influenza, High Dose Seasonal PF 02/16/2017  .  Influenza,inj,Quad PF,6+ Mos 12/18/2013  . Moderna Sars-Covid-2 Vaccination 05/01/2019, 05/29/2019, 02/18/2020    TDAP status: Due, Education has been provided regarding the importance of this vaccine. Advised may receive this vaccine at local pharmacy or Health Dept. Aware to provide a copy of the vaccination record if obtained from local pharmacy or Health Dept. Verbalized acceptance and understanding.  Flu Vaccine status: Due, Education has been provided regarding the importance of this vaccine. Advised may receive this vaccine at local pharmacy or Health Dept. Aware to provide a copy of the vaccination record if obtained from local pharmacy or Health Dept. Verbalized acceptance and understanding.  Pneumococcal vaccine status: Due, Education has been provided regarding the importance of this vaccine. Advised may receive this vaccine at local pharmacy or Health Dept. Aware to provide a copy of the vaccination record if obtained from local pharmacy or Health Dept. Verbalized acceptance and understanding.  Covid-19 vaccine status: Completed vaccines  Qualifies for Shingles Vaccine? Yes   Zostavax completed No   Shingrix Completed?: No.    Education has been provided regarding the importance of this vaccine. Patient has been advised to call insurance company to determine out of pocket expense if they have not yet received this vaccine. Advised may also receive vaccine at local pharmacy or Health Dept. Verbalized acceptance and understanding.  Screening Tests Health Maintenance  Topic Date Due  . TETANUS/TDAP  Never done  . COLONOSCOPY (Pts 45-66yr Insurance coverage will need to be confirmed)  Never done  . MAMMOGRAM  Never done  . Zoster Vaccines- Shingrix (1 of 2) Never done  . DEXA SCAN  Never done  . PNA vac Low Risk Adult (1 of 2 - PCV13) 02/10/2021 (Originally 12/18/2015)  . OPHTHALMOLOGY EXAM  06/10/2021 (Originally 12/17/1960)  . Hepatitis C Screening  06/10/2021 (Originally  12/17/1968)  . FOOT EXAM  08/07/2020  . INFLUENZA VACCINE  09/21/2020  . HEMOGLOBIN A1C  12/10/2020  . COVID-19 Vaccine  Completed  . HPV VACCINES  Aged Out    Health Maintenance  Health Maintenance Due  Topic Date Due  . TETANUS/TDAP  Never done  . COLONOSCOPY (Pts 45-59yr Insurance coverage will need to be confirmed)  Never done  . MAMMOGRAM  Never done  . Zoster Vaccines- Shingrix (1 of 2) Never done  . DEXA SCAN  Never done    Colorectal Cancer Screening: DUE  Mammogram status: Ordered 07/16/20. Pt provided with contact info and advised to call to schedule appt.   Bone Density status: Ordered 05/2020. Pt provided with contact info and advised to call to schedule appt.  Lung Cancer Screening: (Low Dose CT Chest recommended if Age 70-80years, 30 pack-year currently smoking OR have quit w/in 15years.) does not qualify.   Additional Screening:  Hepatitis C Screening: does qualify; Due at next visit  Vision Screening: Recommended annual ophthalmology exams for early detection of glaucoma and other disorders of the eye. Is the patient up to date with their annual eye exam?  No  Who is the provider or what is the name of the office in which the patient attends annual eye exams? n/a If pt is not established with a provider, would they like to be referred to a provider to establish care? No .   Dental Screening: Recommended annual dental exams for proper oral hygiene  Community Resource Referral / Chronic Care Management: CRR required this visit?  No   CCM required this visit?  No      Plan:     I have personally reviewed and noted the following in the patient's chart:   . Medical and social history . Use of alcohol, tobacco or illicit drugs  . Current medications and supplements including opioid prescriptions. Patient is not currently taking opioid prescriptions. . Functional ability and status . Nutritional status . Physical activity . Advanced directives . List  of other physicians . Hospitalizations, surgeries, and ER visits in previous 12 months . Vitals . Screenings to include cognitive, depression, and falls . Referrals and appointments  In addition, I have reviewed and discussed with patient certain preventive protocols, quality metrics, and best practice recommendations. A written personalized care plan for preventive services as well as general preventive health recommendations were provided to patient.     ASandrea Hammond LPN   [id number]  Nurse Notes: None

## 2020-07-16 NOTE — Telephone Encounter (Signed)
Laurine Blazer, LPN  06/07/9197 57:90 AM EDT Back to Top     Notified, copy to pcp.

## 2020-07-16 NOTE — Telephone Encounter (Signed)
-----   Message from Verta Ellen., NP sent at 07/15/2020  3:53 PM EDT ----- Kidney function is mildly decreased No change to therapy for now. Blood sugar is 215. She needs much better control of her blood sugar. Have her follow with primary for better glucose control.  Poor glucose control can contribute to kidney disease. Keep scheduled visit with Dr Harl Bowie in June. Mr Sharolyn Douglas  NP recommended she have a sleep study on previous note.

## 2020-07-16 NOTE — Patient Instructions (Signed)
Amber Reeves , Thank you for taking time to come for your Medicare Wellness Visit. I appreciate your ongoing commitment to your health goals. Please review the following plan we discussed and let me know if I can assist you in the future.   Screening recommendations/referrals: Colonoscopy: Due (Discuss with Dr Lajuana Ripple) Mammogram: Due (ordered today) Bone Density: Done 06/2020 waiting for results - to repeat every 2-5 years Recommended yearly ophthalmology/optometry visit for glaucoma screening and checkup Recommended yearly dental visit for hygiene and checkup  Vaccinations: Influenza vaccine: Due Pneumococcal vaccine: Due Tdap vaccine: Due Shingles vaccine: Due   Covid-19: Done 05/01/19, 05/29/19, & 02/08/20  Advanced directives: Advance directive discussed with you today. I have provided a copy for you to complete at home and have notarized. Once this is complete please bring a copy in to our office so we can scan it into your chart.  Conditions/risks identified: Aim for 30 minutes of exercise or brisk walking each day, drink 6-8 glasses of water and eat lots of fruits and vegetables.  Next appointment: Follow up in one year for your annual wellness visit    Preventive Care 70 Years and Older, Female Preventive care refers to lifestyle choices and visits with your health care provider that can promote health and wellness. What does preventive care include?  A yearly physical exam. This is also called an annual well check.  Dental exams once or twice a year.  Routine eye exams. Ask your health care provider how often you should have your eyes checked.  Personal lifestyle choices, including:  Daily care of your teeth and gums.  Regular physical activity.  Eating a healthy diet.  Avoiding tobacco and drug use.  Limiting alcohol use.  Practicing safe sex.  Taking low-dose aspirin every day.  Taking vitamin and mineral supplements as recommended by your health care  provider. What happens during an annual well check? The services and screenings done by your health care provider during your annual well check will depend on your age, overall health, lifestyle risk factors, and family history of disease. Counseling  Your health care provider may ask you questions about your:  Alcohol use.  Tobacco use.  Drug use.  Emotional well-being.  Home and relationship well-being.  Sexual activity.  Eating habits.  History of falls.  Memory and ability to understand (cognition).  Work and work Statistician.  Reproductive health. Screening  You may have the following tests or measurements:  Height, weight, and BMI.  Blood pressure.  Lipid and cholesterol levels. These may be checked every 5 years, or more frequently if you are over 20 years old.  Skin check.  Lung cancer screening. You may have this screening every year starting at age 70 if you have a 30-pack-year history of smoking and currently smoke or have quit within the past 15 years.  Fecal occult blood test (FOBT) of the stool. You may have this test every year starting at age 70.  Flexible sigmoidoscopy or colonoscopy. You may have a sigmoidoscopy every 5 years or a colonoscopy every 10 years starting at age 70.  Hepatitis C blood test.  Hepatitis B blood test.  Sexually transmitted disease (STD) testing.  Diabetes screening. This is done by checking your blood sugar (glucose) after you have not eaten for a while (fasting). You may have this done every 1-3 years.  Bone density scan. This is done to screen for osteoporosis. You may have this done starting at age 70.  Mammogram. This may be  done every 1-2 years. Talk to your health care provider about how often you should have regular mammograms. Talk with your health care provider about your test results, treatment options, and if necessary, the need for more tests. Vaccines  Your health care provider may recommend certain  vaccines, such as:  Influenza vaccine. This is recommended every year.  Tetanus, diphtheria, and acellular pertussis (Tdap, Td) vaccine. You may need a Td booster every 10 years.  Zoster vaccine. You may need this after age 70.  Pneumococcal 13-valent conjugate (PCV13) vaccine. One dose is recommended after age 70.  Pneumococcal polysaccharide (PPSV23) vaccine. One dose is recommended after age 70. Talk to your health care provider about which screenings and vaccines you need and how often you need them. This information is not intended to replace advice given to you by your health care provider. Make sure you discuss any questions you have with your health care provider. Document Released: 03/06/2015 Document Revised: 10/28/2015 Document Reviewed: 12/09/2014 Elsevier Interactive Patient Education  2017 [place] Prevention in the Home Falls can cause injuries. They can happen to people of all ages. There are many things you can do to make your home safe and to help prevent falls. What can I do on the outside of my home?  Regularly fix the edges of walkways and driveways and fix any cracks.  Remove anything that might make you trip as you walk through a door, such as a raised step or threshold.  Trim any bushes or trees on the path to your home.  Use bright outdoor lighting.  Clear any walking paths of anything that might make someone trip, such as rocks or tools.  Regularly check to see if handrails are loose or broken. Make sure that both sides of any steps have handrails.  Any raised decks and porches should have guardrails on the edges.  Have any leaves, snow, or ice cleared regularly.  Use sand or salt on walking paths during winter.  Clean up any spills in your garage right away. This includes oil or grease spills. What can I do in the bathroom?  Use night lights.  Install grab bars by the toilet and in the tub and shower. Do not use towel bars as grab  bars.  Use non-skid mats or decals in the tub or shower.  If you need to sit down in the shower, use a plastic, non-slip stool.  Keep the floor dry. Clean up any water that spills on the floor as soon as it happens.  Remove soap buildup in the tub or shower regularly.  Attach bath mats securely with double-sided non-slip rug tape.  Do not have throw rugs and other things on the floor that can make you trip. What can I do in the bedroom?  Use night lights.  Make sure that you have a light by your bed that is easy to reach.  Do not use any sheets or blankets that are too big for your bed. They should not hang down onto the floor.  Have a firm chair that has side arms. You can use this for support while you get dressed.  Do not have throw rugs and other things on the floor that can make you trip. What can I do in the kitchen?  Clean up any spills right away.  Avoid walking on wet floors.  Keep items that you use a lot in easy-to-reach places.  If you need to reach something above you, use  a strong step stool that has a grab bar.  Keep electrical cords out of the way.  Do not use floor polish or wax that makes floors slippery. If you must use wax, use non-skid floor wax.  Do not have throw rugs and other things on the floor that can make you trip. What can I do with my stairs?  Do not leave any items on the stairs.  Make sure that there are handrails on both sides of the stairs and use them. Fix handrails that are broken or loose. Make sure that handrails are as long as the stairways.  Check any carpeting to make sure that it is firmly attached to the stairs. Fix any carpet that is loose or worn.  Avoid having throw rugs at the top or bottom of the stairs. If you do have throw rugs, attach them to the floor with carpet tape.  Make sure that you have a light switch at the top of the stairs and the bottom of the stairs. If you do not have them, ask someone to add them for  you. What else can I do to help prevent falls?  Wear shoes that:  Do not have high heels.  Have rubber bottoms.  Are comfortable and fit you well.  Are closed at the toe. Do not wear sandals.  If you use a stepladder:  Make sure that it is fully opened. Do not climb a closed stepladder.  Make sure that both sides of the stepladder are locked into place.  Ask someone to hold it for you, if possible.  Clearly mark and make sure that you can see:  Any grab bars or handrails.  First and last steps.  Where the edge of each step is.  Use tools that help you move around (mobility aids) if they are needed. These include:  Canes.  Walkers.  Scooters.  Crutches.  Turn on the lights when you go into a dark area. Replace any light bulbs as soon as they burn out.  Set up your furniture so you have a clear path. Avoid moving your furniture around.  If any of your floors are uneven, fix them.  If there are any pets around you, be aware of where they are.  Review your medicines with your doctor. Some medicines can make you feel dizzy. This can increase your chance of falling. Ask your doctor what other things that you can do to help prevent falls. This information is not intended to replace advice given to you by your health care provider. Make sure you discuss any questions you have with your health care provider. Document Released: 12/04/2008 Document Revised: 07/16/2015 Document Reviewed: 03/14/2014 Elsevier Interactive Patient Education  2017 [employer].

## 2020-07-17 DIAGNOSIS — Z780 Asymptomatic menopausal state: Secondary | ICD-10-CM | POA: Diagnosis not present

## 2020-07-17 DIAGNOSIS — M810 Age-related osteoporosis without current pathological fracture: Secondary | ICD-10-CM | POA: Diagnosis not present

## 2020-07-23 ENCOUNTER — Ambulatory Visit: Payer: Medicare HMO | Admitting: Cardiology

## 2020-08-04 ENCOUNTER — Ambulatory Visit (INDEPENDENT_AMBULATORY_CARE_PROVIDER_SITE_OTHER): Payer: Medicare HMO | Admitting: Pharmacist

## 2020-08-04 ENCOUNTER — Telehealth: Payer: Self-pay | Admitting: Pharmacist

## 2020-08-04 ENCOUNTER — Other Ambulatory Visit: Payer: Self-pay

## 2020-08-04 DIAGNOSIS — M810 Age-related osteoporosis without current pathological fracture: Secondary | ICD-10-CM

## 2020-08-04 NOTE — Progress Notes (Signed)
     08/04/2020 Name: Miho Monda MRN: [id number] DOB: 01/28/1951   S:   Presents for diabetes evaluation, education, and management Current Height:   5'3"     Max Lifetime Height:  5'3' Current Weight:   228 lb  Ethnicity:Caucasian   HPI: Does pt already have a diagnosis of:  Osteoporosis?  Yes  Back Pain?  Yes       Kyphosis?  No Prior fracture?  Yes left elbow Med(s) for Osteoporosis/Osteopenia:  n/a Med(s) previously tried for Osteoporosis/Osteopenia:  n/a                                                             PMH: HRT? No Steroid Use?  No Thyroid med?  No History of cancer?  No History of digestive disorders (ie Crohn's)?  No Current or previous eating disorders?  No Last Vitamin D Result:  n/a Last GFR Result:  48   FH/SH: Family history of osteoporosis?  No Parent with history of hip fracture?  No Family history of breast cancer?  No Exercise?  No Smoking?  No Alcohol?  No    Calcium Assessment Calcium Intake  # of servings/day  Calcium mg  Milk (8 oz) 1  x  300  = 300  Yogurt (4 oz) 0 x  200 = 0  Cheese (1 oz) 1 x  200 = 200  Other Calcium sources   266m  Ca supplement 0 = 0   Estimated calcium intake per day 750   FINDINGS: AP LUMBAR SPINE L1 through L4 Bone Mineral Density (BMD):  1.135 g/cm2 Young Adult T-Score:  -0.5 Z-Score:  0.0   LEFT FEMUR NECK Bone Mineral Density (BMD):  0.631 g/cm2 Young Adult T-Score: -2.9 Z-Score:  -2.0   Scan quality: The scan quality is good. Exclusions: None.   ASSESSMENT: Patient's diagnostic category is OSTEOPOROSIS by WHO Criteria.   FRACTURE RISK: INCREASED   FRAX: World Health Organization FRAX assessment of absolute fracture risk is not calculated for this patient because the patient has osteoporosis.   COMPARISON: None.  Recommendations: 1.  Consider Prolia --will check with insurance first; hold on fosamax due to decline in renal function 2.  recommend calcium 12041mdaily through  supplementation or diet.  3.  recommend weight bearing exercise - 30 minutes at least 4 days per week.   4.  Counseled and educated about fall risk and prevention.  Recheck DEXA:  2 years  Time spent counseling patient:  25 minutes  JuRegina EckPharmD, BCPS Clinical Pharmacist, [facility] Phone [phone]

## 2020-08-04 NOTE — Telephone Encounter (Signed)
Potential new start prolia pending benefits verification & cost   Thanks! Amber Reeves

## 2020-08-12 NOTE — Telephone Encounter (Signed)
Benefit verification sent 6/22

## 2020-08-17 ENCOUNTER — Other Ambulatory Visit: Payer: Self-pay

## 2020-08-17 ENCOUNTER — Ambulatory Visit: Payer: Medicare HMO | Admitting: Cardiology

## 2020-08-17 ENCOUNTER — Encounter: Payer: Self-pay | Admitting: Cardiology

## 2020-08-17 VITALS — BP 126/78 | HR 59 | Ht 63.0 in | Wt 231.6 lb

## 2020-08-17 DIAGNOSIS — R0789 Other chest pain: Secondary | ICD-10-CM

## 2020-08-17 DIAGNOSIS — I5032 Chronic diastolic (congestive) heart failure: Secondary | ICD-10-CM | POA: Diagnosis not present

## 2020-08-17 DIAGNOSIS — I10 Essential (primary) hypertension: Secondary | ICD-10-CM | POA: Diagnosis not present

## 2020-08-17 DIAGNOSIS — I2510 Atherosclerotic heart disease of native coronary artery without angina pectoris: Secondary | ICD-10-CM | POA: Diagnosis not present

## 2020-08-17 MED ORDER — ISOSORBIDE MONONITRATE ER 60 MG PO TB24: ORAL_TABLET | ORAL | 3 refills | Status: DC

## 2020-08-17 NOTE — Patient Instructions (Addendum)
Medication Instructions:  Increase Imdur to 115m every morning & 666mevery evening. Continue all other medications.    Labwork: BMET, Mg - orders given today.  Office will contact with results via phone or letter.    Testing/Procedures: none  Follow-Up: 1 month   Any Other Special Instructions Will Be Listed Below (If Applicable).  If you need a refill on your cardiac medications before your next appointment, please call your pharmacy.

## 2020-08-17 NOTE — Progress Notes (Signed)
Clinical Summary [name] is a 70 y.o.femaleseen today for follow up of the following medical problems.    1. CAD -11/2017 cath at [facility] showed >70% mid LAD, otherwise patent vessels. She presented with chest pain, not an MI - received DES to LAD - 11/2017 echo  LVEF Complete echocardiogram shows a left ventricular EF by visual approximation 60 to 65%. Normal left ventricular systolic function. Grade 1 left ventricular diastolic function. No significant valvular disease. No pericardial effusion   01/2018 stress test: THR not achieved, exercise limiting angina , duke treadmill score neg 1   - she reports prior false negative stress tests in the past.     - 09/04/18 nuclear stress by prior cardiollogist showed anterior breast artifact, probably normal, Jan 2021 cath: mild nonobstructive disease.        -06/2020 echo LVEF 65-70%, grade II dd,  - lasix changed to 53m daily   pressure midchest, 5/10 in severity. Can last just a second or two, can have lingering pain about 20 minutes. Can occur at rest or with activity. +headache, +SOB. Has not tried nitro - isosorbide increased last visit, improved symptoms.  - occurs every few days      2. HTN - renal artery angio at time of 11/2017 cath showed no significant renal artery disease - she remains compliant with meds     3. Hyperlipidemia - she is compliant with statin.      4. OSA on cpap - she attributes getting pneumonia when on cpap, she stopped wearing. - does not want to wear cpap      5. Chronic diastolic HF - SOB unchanged - some recent edema. Home weights 228 to 231.   Past Medical History:  Diagnosis Date   Allergy    Anxiety    Arthritis    Asthma    Depression    Diabetes mellitus without complication (HCC)    GERD (gastroesophageal reflux disease)    Hyperlipidemia    Hypertension      Allergies  Allergen Reactions   Aspartame Rash   Penicillins Itching and Rash    Did it involve  swelling of the face/tongue/throat, SOB, or low BP? No Did it involve sudden or severe rash/hives, skin peeling, or any reaction on the inside of your mouth or nose? No Did you need to seek medical attention at a hospital or doctor's office? No When did it last happen?~25 years ago       If all above answers are "NO", may proceed with cephalosporin use.    FWilder Glade[Dapagliflozin]     UTIs   Metformin And Related     GI distress, dizziness   No Healthtouch Food Allergies     Honey-Rash Artificial Sweeteners-rash   Paxil [Paroxetine Hcl] Other (See Comments)    Twitching/feels like skin crawling   Prozac [Fluoxetine Hcl] Other (See Comments)    Twitching/feels like skin crawling   Zoloft [Sertraline Hcl] Other (See Comments)    Twitching/feels like skin crawling     Current Outpatient Medications  Medication Sig Dispense Refill   albuterol (VENTOLIN HFA) 108 (90 Base) MCG/ACT inhaler TAKE 2 PUFFS BY MOUTH EVERY 4 HOURS AS NEEDED FOR WHEEZE 18 g 3   Alcohol Swabs (B-D SINGLE USE SWABS REGULAR) PADS Apply topically.     aspirin EC 81 MG tablet Take 81 mg by mouth 2 (two) times daily.     atorvastatin (LIPITOR) 40 MG tablet Take 1 tablet (40 mg  total) by mouth daily at 6 PM. 90 tablet 3   Blood Glucose Calibration (ACCU-CHEK AVIVA) SOLN 2 Bottles by Other route as needed.     budesonide-formoterol (SYMBICORT) 160-4.5 MCG/ACT inhaler Inhale 2 puffs into the lungs 2 (two) times daily. 4 each 11   diphenhydrAMINE (BENADRYL) 25 MG tablet Take 25 mg by mouth every 6 (six) hours as needed for allergies.     docusate sodium (COLACE) 100 MG capsule Take 100 mg by mouth 2 (two) times daily as needed (constipation.).      fluticasone (FLONASE) 50 MCG/ACT nasal spray Place 2 sprays into both nostrils daily. 48 g 3   furosemide (LASIX) 20 MG tablet Take 20 mg by mouth daily.     glucose blood test strip TEST BLOOD SUGAR TWICE DAILY     insulin degludec (TRESIBA FLEXTOUCH) 200 UNIT/ML FlexTouch Pen  Inject 40 Units into the skin daily. 18 mL 0   Insulin Syringe-Needle U-100 31G X 5/16" 0.5 ML MISC Use to inject Insulin four times daily: Use as directed; Dx: E11.9, E66.9     ipratropium-albuterol (DUONEB) 0.5-2.5 (3) MG/3ML SOLN TAKE 3 ML (1 VIAL) BY NEBULIZATION EVERY 6 HOURS AS NEEDED FOR WHEEZING. 360 mL 0   isosorbide mononitrate (IMDUR) 60 MG 24 hr tablet Take 2 tablets (120 mg total) by mouth daily. 180 tablet 3   lansoprazole (PREVACID) 30 MG capsule Take 30 mg by mouth at bedtime.      lisinopril (ZESTRIL) 40 MG tablet Take 1 tablet (40 mg total) by mouth daily. 90 tablet 3   magnesium oxide (MAG-OX) 400 MG tablet Take magnesium on days you take lasix 60 tablet 3   metoprolol tartrate (LOPRESSOR) 25 MG tablet Take 1 tablet (25 mg total) by mouth 2 (two) times daily. 180 tablet 3   montelukast (SINGULAIR) 10 MG tablet Take 1 tablet (10 mg total) by mouth at bedtime. 90 tablet 3   nitroGLYCERIN (NITROSTAT) 0.4 MG SL tablet Place 1 tablet (0.4 mg total) under the tongue every 5 (five) minutes as needed for chest pain. 25 tablet 3   pioglitazone (ACTOS) 30 MG tablet Take 1 tablet (30 mg total) by mouth daily. 90 tablet 3   potassium chloride (KLOR-CON M10) 10 MEQ tablet TAKE 1 TABLET (10 MEQ TOTAL) BY MOUTH EVERY EVENING. 90 tablet 3   Current Facility-Administered Medications  Medication Dose Route Frequency Provider Last Rate Last Admin   sodium chloride flush (NS) 0.9 % injection 3 mL  3 mL Intravenous Q12H Arnoldo Lenis, MD         Past Surgical History:  Procedure Laterality Date   ABDOMINAL HYSTERECTOMY     CESAREAN SECTION     3   CHOLECYSTECTOMY  2018   CORONARY STENT PLACEMENT  10/2017   [facility] hospital)   LEFT HEART CATH AND CORONARY ANGIOGRAPHY N/A 02/25/2019   Procedure: LEFT HEART CATH AND CORONARY ANGIOGRAPHY;  Surgeon: Nelva Bush, MD;  Location: [place] [facility];  Service: Cardiovascular;  Laterality: N/A;   REPLACEMENT TOTAL KNEE BILATERAL  Bilateral    done in [place] (2003/2005)     Allergies  Allergen Reactions   Aspartame Rash   Penicillins Itching and Rash    Did it involve swelling of the face/tongue/throat, SOB, or low BP? No Did it involve sudden or severe rash/hives, skin peeling, or any reaction on the inside of your mouth or nose? No Did you need to seek medical attention at a hospital or doctor's office?  No When did it last happen?~25 years ago       If all above answers are "NO", may proceed with cephalosporin use.    Wilder Glade [Dapagliflozin]     UTIs   Metformin And Related     GI distress, dizziness   No Healthtouch Food Allergies     Honey-Rash Artificial Sweeteners-rash   Paxil [Paroxetine Hcl] Other (See Comments)    Twitching/feels like skin crawling   Prozac [Fluoxetine Hcl] Other (See Comments)    Twitching/feels like skin crawling   Zoloft [Sertraline Hcl] Other (See Comments)    Twitching/feels like skin crawling      Family History  Problem Relation Age of Onset   Heart disease Mother    Stroke Mother    Hypertension Mother    Cancer Father    Stroke Father      Social History Ms. Martinezgarcia reports that she has never smoked. She has never used smokeless tobacco. Ms. Templeman reports no history of alcohol use.   Review of Systems CONSTITUTIONAL: No weight loss, fever, chills, weakness or fatigue.  HEENT: Eyes: No visual loss, blurred vision, double vision or yellow sclerae.No hearing loss, sneezing, congestion, runny nose or sore throat.  SKIN: No rash or itching.  CARDIOVASCULAR: per hpi RESPIRATORY: per hpi GASTROINTESTINAL: No anorexia, nausea, vomiting or diarrhea. No abdominal pain or blood.  GENITOURINARY: No burning on urination, no polyuria NEUROLOGICAL: No headache, dizziness, syncope, paralysis, ataxia, numbness or tingling in the extremities. No change in bowel or bladder control.  MUSCULOSKELETAL: No muscle, back pain, joint pain or stiffness.  LYMPHATICS: No  enlarged nodes. No history of splenectomy.  PSYCHIATRIC: No history of depression or anxiety.  ENDOCRINOLOGIC: No reports of sweating, cold or heat intolerance. No polyuria or polydipsia.  Marland Kitchen   Physical Examination Today's Vitals   08/17/20 1401  BP: 126/78  Pulse: (!) 59  SpO2: 97%  Weight: 231 lb 9.6 oz (105.1 kg)  Height: 5' 3"  (1.6 m)   Body mass index is 41.03 kg/m.  Gen: resting comfortably, no acute distress HEENT: no scleral icterus, pupils equal round and reactive, no palptable cervical adenopathy,  CV: RRR, no m/rg, no jvd Resp: Clear to auscultation bilaterally GI: abdomen is soft, non-tender, non-distended, normal bowel sounds, no hepatosplenomegaly MSK: extremities are warm, no edema.  Skin: warm, no rash Neuro:  no focal deficits Psych: appropriate affect   Diagnostic Studies 11/2017 cath [facility] Severe >70% long mid LAD stenosis No other significant angiographic stenosis in the other coronary arteries  DES to mid LAD   Malignant hypertension - selective renal angio without any evidence of  renal artery stenosis.   Renal angio procedure:  A Tiger 4 catheter was used to selectively engage both the left and right  renal arteries and was used to perform selective angiography.    Renal artery anatomy:  The left kidney is supplied by a single renal artery which has no  angiographic stenosis  The right kidney is supplied by a single rena artery which has no  angiographic stenosis     Jan 2021 Mild, non-obstructive coronary artery disease. Widely patent proximal/mid LAD stent. Normal left ventricular systolic function with upper normal to mildly elevated filling pressure (LVEDP ~15 mmHg).   06/2020 echo  1. Left ventricular ejection fraction, by estimation, is 65 to 70%. The  left ventricle has normal function. Left ventricular endocardial border  not optimally defined to evaluate regional wall motion. There is mild left  ventricular  hypertrophy.  Left  ventricular diastolic parameters are consistent with Grade II diastolic  dysfunction (pseudonormalization). Normal global longitudinal strain of  -19.8%.   2. Right ventricular systolic function is normal. The right ventricular  size is normal. There is normal pulmonary artery systolic pressure. The  estimated right ventricular systolic pressure is 31.4 mmHg.   3. Left atrial size was mildly dilated.   4. Right atrial size was mildly dilated.   5. The mitral valve is grossly normal. Trivial mitral valve  regurgitation.   6. The aortic valve has an indeterminant number of cusps. Aortic valve  regurgitation is not visualized.   7. The inferior vena cava is normal in size with greater than 50%  respiratory variability, suggesting right atrial pressure of 3 mmHg.    Assessment and Plan  1. CAD - Jan 2021 cath shows no significant disease - ongoing recent chest pains, mildly improved with higher dose of imdur - has had issues with inaccurate stress tests in the past  - with ongoing symptoms and fairly recent normal cath suspect perhaps microvascular disease vs vasospasm. Increase imdur to 171m in AM and 663min PM - if symptoms remain refractroy or progress would consider repeat cath, with her degree of DOE would also add RHC on.    2. Chronic diastolic HF - uptrend in Cr since she started taking lasix 2091maily, had been on prn before. No significant improvement in SOB. Repeat labs.    F/u 1 months   JonArnoldo Lenis.D.

## 2020-08-20 ENCOUNTER — Other Ambulatory Visit: Payer: Self-pay

## 2020-08-20 ENCOUNTER — Other Ambulatory Visit: Payer: Medicare HMO

## 2020-08-20 ENCOUNTER — Telehealth: Payer: Self-pay | Admitting: *Deleted

## 2020-08-20 DIAGNOSIS — I10 Essential (primary) hypertension: Secondary | ICD-10-CM | POA: Diagnosis not present

## 2020-08-20 DIAGNOSIS — I5032 Chronic diastolic (congestive) heart failure: Secondary | ICD-10-CM | POA: Diagnosis not present

## 2020-08-20 NOTE — Telephone Encounter (Signed)
Pt called and aware that we recvd #3 boxes of tresiba and 2 boxes of pen needles for pt = aware in fridge w/ name on it.  Will pick up today.

## 2020-08-21 LAB — BASIC METABOLIC PANEL
BUN/Creatinine Ratio: 12 (ref 12–28)
BUN: 12 mg/dL (ref 8–27)
CO2: 27 mmol/L (ref 20–29)
Calcium: 8.8 mg/dL (ref 8.7–10.3)
Chloride: 100 mmol/L (ref 96–106)
Creatinine, Ser: 0.97 mg/dL (ref 0.57–1.00)
Glucose: 176 mg/dL — ABNORMAL HIGH (ref 65–99)
Potassium: 4.3 mmol/L (ref 3.5–5.2)
Sodium: 141 mmol/L (ref 134–144)
eGFR: 63 mL/min/{1.73_m2} (ref >59)

## 2020-08-21 LAB — MAGNESIUM: Magnesium: 1.6 mg/dL (ref 1.6–2.3)

## 2020-08-25 DIAGNOSIS — E119 Type 2 diabetes mellitus without complications: Secondary | ICD-10-CM | POA: Diagnosis not present

## 2020-08-25 LAB — HM DIABETES EYE EXAM

## 2020-08-28 ENCOUNTER — Telehealth: Payer: Self-pay | Admitting: Family Medicine

## 2020-08-28 NOTE — Telephone Encounter (Signed)
FYI

## 2020-08-31 ENCOUNTER — Telehealth: Payer: Self-pay

## 2020-08-31 ENCOUNTER — Ambulatory Visit
Admission: RE | Admit: 2020-08-31 | Discharge: 2020-08-31 | Disposition: A | Discharge status: Home / Self Care | Payer: Medicare HMO | Source: Ambulatory Visit | Attending: Family Medicine | Admitting: Family Medicine

## 2020-08-31 ENCOUNTER — Other Ambulatory Visit: Payer: Self-pay

## 2020-08-31 DIAGNOSIS — Z1231 Encounter for screening mammogram for malignant neoplasm of breast: Secondary | ICD-10-CM

## 2020-08-31 MED ORDER — RANOLAZINE ER 500 MG PO TB12: 500.0000 mg | ORAL_TABLET | Freq: Two times a day (BID) | ORAL | 3 refills | Status: DC

## 2020-08-31 MED ORDER — ISOSORBIDE MONONITRATE ER 60 MG PO TB24: 120.0000 mg | ORAL_TABLET | Freq: Every day | ORAL | 3 refills | Status: DC

## 2020-08-31 NOTE — Telephone Encounter (Signed)
Pt notified of medication changes. Pt will update our office in 1 week with symptoms. Medication list updated to reflect changes.

## 2020-08-31 NOTE — Progress Notes (Signed)
Can lower her imdur back to 117m daily, can she start ranexa 5045mbid for ongoing chest pain and udpate usKorean 1 week on symptoms   J Zandra AbtsD

## 2020-08-31 NOTE — Telephone Encounter (Signed)
-----   Message from Arnoldo Lenis, MD sent at 08/31/2020 10:05 AM EDT ----- Can lower her imdur back to 150m daily, can she start ranexa 5073mbid for ongoing chest pain and udpate usKorean 1 week on symptoms   J Zandra AbtsD ----- Message ----- From: BuBerlinda LastCMA Sent: 08/28/2020   2:28 PM EDT To: JoArnoldo LenisMD  8/5 is the earliest appt available with him. She has 8/1 appt.  ----- Message ----- From: BrArnoldo LenisMD Sent: 08/28/2020   2:17 PM EDT To: AsBerlinda LastCMA  Are there any earlier appts with AnJonni Sanger  J Zandra AbtsD ----- Message ----- From: BuBerlinda LastCMA Sent: 08/27/2020  10:18 AM EDT To: JoArnoldo LenisMD  Pt notified of lab results and verbalized understanding, however states that she is still having CP, dizziness, and headaches.

## 2020-09-11 ENCOUNTER — Telehealth: Payer: Self-pay | Admitting: Cardiology

## 2020-09-11 NOTE — Telephone Encounter (Signed)
Patient states since starting Ranexa 500 mg BID 11 days ago, she has had headache, fatigue and a sensation of the room "swirling" around her. She states she is not dizzy or lightheaded, denies CP. She reports her symptoms improve after going to bed for a while.   She did get BP monitor to work-BP 153/80, HR 80   She could not get her BP machine to work at home and has not routinely used it. She held pm dose of Ranexa last night but took a dose this am.  She did get BP monitor to work-BP 153/80, HR 80   After speaking with B.Strader, PA-C, I will advise he to hold Ranexa and I will message Dr.Branch for disposition.   I also encouraged her to go to the ED if symptoms worsen.

## 2020-09-11 NOTE — Telephone Encounter (Signed)
New message     Pt c/o medication issue:  1. Name of Medication: ranolazine (RANEXA) 500 MG 12 hr tablet  2. How are you currently taking this medication (dosage and times per day)? 2x a day  3. Are you having a reaction (difficulty breathing--STAT)? yes  4. What is your medication issue? Lightheaded heart racing , sob and chest pain extreme fatigue and feels like everything is spinning

## 2020-09-14 NOTE — Telephone Encounter (Signed)
Since holding ranexa are her symptoms any improved?   Zandra Abts MD

## 2020-09-14 NOTE — Telephone Encounter (Signed)
Since holding Ranexa, pt states that she is still SOB, her balance is still off, she has lesser CP, but the pain is still present. She does report that she is no longer having a racing heartbeat and uses her nebulizer/inhaler which seems to temporarily relieve her SOB. Pt was unable to provide bp/hr

## 2020-09-15 ENCOUNTER — Other Ambulatory Visit: Payer: Self-pay

## 2020-09-15 ENCOUNTER — Other Ambulatory Visit: Payer: Self-pay | Admitting: Family Medicine

## 2020-09-15 ENCOUNTER — Telehealth (INDEPENDENT_AMBULATORY_CARE_PROVIDER_SITE_OTHER): Payer: Medicare HMO | Admitting: Cardiology

## 2020-09-15 ENCOUNTER — Encounter: Payer: Self-pay | Admitting: Cardiology

## 2020-09-15 DIAGNOSIS — G4733 Obstructive sleep apnea (adult) (pediatric): Secondary | ICD-10-CM

## 2020-09-15 DIAGNOSIS — I2510 Atherosclerotic heart disease of native coronary artery without angina pectoris: Secondary | ICD-10-CM | POA: Diagnosis not present

## 2020-09-15 DIAGNOSIS — E785 Hyperlipidemia, unspecified: Secondary | ICD-10-CM

## 2020-09-15 DIAGNOSIS — I5032 Chronic diastolic (congestive) heart failure: Secondary | ICD-10-CM

## 2020-09-15 DIAGNOSIS — Z9989 Dependence on other enabling machines and devices: Secondary | ICD-10-CM

## 2020-09-15 DIAGNOSIS — R928 Other abnormal and inconclusive findings on diagnostic imaging of breast: Secondary | ICD-10-CM

## 2020-09-15 DIAGNOSIS — I10 Essential (primary) hypertension: Secondary | ICD-10-CM | POA: Diagnosis not present

## 2020-09-15 NOTE — Addendum Note (Signed)
Addended by: Christella Scheuermann C on: 09/15/2020 02:50 PM   Modules accepted: Orders

## 2020-09-15 NOTE — H&P (View-Only) (Signed)
Virtual Visit via Telephone Note   This visit type was conducted due to national recommendations for restrictions regarding the COVID-19 Pandemic (e.g. social distancing) in an effort to limit this patient's exposure and mitigate transmission in our community.  Due to her co-morbid illnesses, this patient is at least at moderate risk for complications without adequate follow up.  This format is felt to be most appropriate for this patient at this time.  The patient did not have access to video technology/had technical difficulties with video requiring transitioning to audio format only (telephone).  All issues noted in this document were discussed and addressed.  No physical exam could be performed with this format.  Please refer to the patient's chart for her  consent to telehealth for [facility].    Date:  09/15/2020   ID:  Amber Reeves, DOB March 30, 1950, MRN [id number] The patient was identified using 2 identifiers.  Patient Location: Home Provider Location: Office/Clinic   PCP:  Janora Norlander, [place] [facility] Cardiologist:  Carlyle Dolly, MD     Evaluation Performed:  Follow-Up Visit  Chief Complaint:  Chest pain, SOB  History of Present Illness:    Amber Reeves is a 70 y.o. female with seen today for follow up of the following medical problems.    1. CAD -11/2017 cath at [facility] showed >70% mid LAD, otherwise patent vessels. She presented with chest pain, not an MI - received DES to LAD - 11/2017 echo  LVEF Complete echocardiogram shows a left ventricular EF by visual approximation 60 to 65%. Normal left ventricular systolic function. Grade 1 left ventricular diastolic function. No significant valvular disease. No pericardial effusion   01/2018 stress test: THR not achieved, exercise limiting angina , duke treadmill score neg 1   - she reports prior false negative stress tests in the past.     - 09/04/18 nuclear stress by prior  cardiollogist showed anterior breast artifact, probably normal, Jan 2021 cath: mild nonobstructive disease.      -06/2020 echo LVEF 65-70%, grade II dd, - lasix changed to 75m daily    - last visit reported pressure midchest, 5/10 in severity. Can last just a second or two, can have lingering pain about 20 minutes. Can occur at rest or with activity. +headache, +SOB. Has not tried nitro - isosorbide increased last visit, improved symptoms. - occurs every few days  - tried ranexa, reported dizziness, headache and fatigue - higher imdur dose causes dizziness - ongoing chest pain and SOB/DOE. DOE just washing dishes.        2. HTN - renal artery angio at time of 11/2017 cath showed no significant renal artery disease      3. Hyperlipidemia - she is compliant with statin.      4. OSA on cpap - she attributes getting pneumonia when on cpap, she stopped wearing. - does not want to wear cpap       5. Chronic diastolic HF - SOB unchanged     The patient does not have symptoms concerning for COVID-19 infection (fever, chills, cough, or new shortness of breath).    Past Medical History:  Diagnosis Date   Allergy    Anxiety    Arthritis    Asthma    Depression    Diabetes mellitus without complication (HCC)    GERD (gastroesophageal reflux disease)    Hyperlipidemia    Hypertension    Past Surgical History:  Procedure Laterality Date  ABDOMINAL HYSTERECTOMY     BREAST EXCISIONAL BIOPSY     CESAREAN SECTION     3   CHOLECYSTECTOMY  2018   CORONARY STENT PLACEMENT  10/2017   [facility])   LEFT HEART CATH AND CORONARY ANGIOGRAPHY N/A 02/25/2019   Procedure: LEFT HEART CATH AND CORONARY ANGIOGRAPHY;  Surgeon: Nelva Bush, MD;  Location: [facility];  Service: Cardiovascular;  Laterality: N/A;   REPLACEMENT TOTAL KNEE BILATERAL Bilateral    done in [place] (2003/2005)     Current Meds  Medication Sig   albuterol (VENTOLIN HFA) 108  (90 Base) MCG/ACT inhaler TAKE 2 PUFFS BY MOUTH EVERY 4 HOURS AS NEEDED FOR WHEEZE   Alcohol Swabs (B-D SINGLE USE SWABS REGULAR) PADS Apply topically.   aspirin EC 81 MG tablet Take 81 mg by mouth 2 (two) times daily.   atorvastatin (LIPITOR) 40 MG tablet Take 1 tablet (40 mg total) by mouth daily at 6 PM.   Blood Glucose Calibration (ACCU-CHEK AVIVA) SOLN 2 Bottles by Other route as needed.   budesonide-formoterol (SYMBICORT) 160-4.5 MCG/ACT inhaler Inhale 2 puffs into the lungs 2 (two) times daily.   diphenhydrAMINE (BENADRYL) 25 MG tablet Take 25 mg by mouth every 6 (six) hours as needed for allergies.   docusate sodium (COLACE) 100 MG capsule Take 100 mg by mouth 2 (two) times daily as needed (constipation.).    fluticasone (FLONASE) 50 MCG/ACT nasal spray Place 2 sprays into both nostrils daily.   furosemide (LASIX) 20 MG tablet Take 20 mg by mouth daily.   glucose blood test strip TEST BLOOD SUGAR TWICE DAILY   insulin degludec (TRESIBA FLEXTOUCH) 200 UNIT/ML FlexTouch Pen Inject 40 Units into the skin daily.   Insulin Syringe-Needle U-100 31G X 5/16" 0.5 ML MISC Use to inject Insulin four times daily: Use as directed; Dx: E11.9, E66.9   ipratropium-albuterol (DUONEB) 0.5-2.5 (3) MG/3ML SOLN TAKE 3 ML (1 VIAL) BY NEBULIZATION EVERY 6 HOURS AS NEEDED FOR WHEEZING.   isosorbide mononitrate (IMDUR) 60 MG 24 hr tablet Take 2 tablets (120 mg total) by mouth daily.   lansoprazole (PREVACID) 30 MG capsule Take 30 mg by mouth at bedtime.    lisinopril (ZESTRIL) 40 MG tablet Take 1 tablet (40 mg total) by mouth daily.   magnesium oxide (MAG-OX) 400 MG tablet Take magnesium on days you take lasix   metoprolol tartrate (LOPRESSOR) 25 MG tablet Take 1 tablet (25 mg total) by mouth 2 (two) times daily.   montelukast (SINGULAIR) 10 MG tablet Take 1 tablet (10 mg total) by mouth at bedtime.   nitroGLYCERIN (NITROSTAT) 0.4 MG SL tablet Place 1 tablet (0.4 mg total) under the tongue every 5 (five) minutes  as needed for chest pain.   pioglitazone (ACTOS) 30 MG tablet Take 1 tablet (30 mg total) by mouth daily.   potassium chloride (KLOR-CON M10) 10 MEQ tablet TAKE 1 TABLET (10 MEQ TOTAL) BY MOUTH EVERY EVENING.   Current Facility-Administered Medications for the 09/15/20 encounter (Telemedicine) with Arnoldo Lenis, MD  Medication   sodium chloride flush (NS) 0.9 % injection 3 mL     Allergies:   Aspartame, Penicillins, Farxiga [dapagliflozin], Metformin and related, No healthtouch food allergies, Paxil [paroxetine hcl], Prozac [fluoxetine hcl], and Zoloft [sertraline hcl]   Social History   Tobacco Use   Smoking status: Never   Smokeless tobacco: Never  Vaping Use   Vaping Use: Never used  Substance Use Topics   Alcohol use: Never  Drug use: Never     Family Hx: The patient's family history includes Cancer in her father; Heart disease in her mother; Hypertension in her mother; Stroke in her father and mother.  ROS:   Please see the history of present illness.     All other systems reviewed and are negative.   Prior CV studies:   The following studies were reviewed today:  11/2017 cath [facility] Severe >70% long mid LAD stenosis No other significant angiographic stenosis in the other coronary arteries  DES to mid LAD   Malignant hypertension - selective renal angio without any evidence of  renal artery stenosis.   Renal angio procedure:  A Tiger 4 catheter was used to selectively engage both the left and right  renal arteries and was used to perform selective angiography.    Renal artery anatomy:  The left kidney is supplied by a single renal artery which has no  angiographic stenosis  The right kidney is supplied by a single rena artery which has no  angiographic stenosis     Jan 2021 Mild, non-obstructive coronary artery disease. Widely patent proximal/mid LAD stent. Normal left ventricular systolic function with upper normal to mildly elevated  filling pressure (LVEDP ~15 mmHg).   06/2020 echo  1. Left ventricular ejection fraction, by estimation, is 65 to 70%. The  left ventricle has normal function. Left ventricular endocardial border  not optimally defined to evaluate regional wall motion. There is mild left  ventricular hypertrophy. Left  ventricular diastolic parameters are consistent with Grade II diastolic  dysfunction (pseudonormalization). Normal global longitudinal strain of  -19.8%.   2. Right ventricular systolic function is normal. The right ventricular  size is normal. There is normal pulmonary artery systolic pressure. The  estimated right ventricular systolic pressure is 93.9 mmHg.   3. Left atrial size was mildly dilated.   4. Right atrial size was mildly dilated.   5. The mitral valve is grossly normal. Trivial mitral valve  regurgitation.   6. The aortic valve has an indeterminant number of cusps. Aortic valve  regurgitation is not visualized.   7. The inferior vena cava is normal in size with greater than 50%  respiratory variability, suggesting right atrial pressure of 3 mmHg.      Labs/Other Tests and Data Reviewed:    EKG:  No ECG reviewed.  Recent Labs: 11/12/2019: ALT 23 08/20/2020: BUN 12; Creatinine, Ser 0.97; Magnesium 1.6; Potassium 4.3; Sodium 141   Recent Lipid Panel Lab Results  Component Value Date/Time   CHOL 151 11/12/2019 03:20 PM   TRIG 208 (H) 11/12/2019 03:20 PM   HDL 45 11/12/2019 03:20 PM   CHOLHDL 3.4 11/12/2019 03:20 PM   LDLCALC 72 11/12/2019 03:20 PM    Wt Readings from Last 3 Encounters:  09/15/20 215 lb (97.5 kg)  08/17/20 231 lb 9.6 oz (105.1 kg)  07/16/20 228 lb (103.4 kg)     Risk Assessment/Calculations:          Objective:    Vital Signs:  BP 127/64   Pulse 60   Ht 5' 3"  (1.6 m)   Wt 215 lb (97.5 kg)   BMI 38.09 kg/m    Normal affect. NOrmal speech pattern and tone. Comfortable, no apparent distress. No audible signs of sob or  wheezing  ASSESSMENT & PLAN:    1. CAD - history of prior stent to LAD - Jan 2021 cath shows no significant disease - has had issues with inaccurate stress tests in  the past - ongoign chest pains and SOB. Did not tolerate higher imdur doses, did not tolerate ranexa. - givein ongoing symptoms will plan for left and right heart cath, right heart cath given the degree of her dyspnea combined with her diastolic HF and difficult to assess volume status.    2. Chronic diastolic HF - uptrend in Cr since she started taking lasix 45m daily, had been on prn before. - she has some orthostatic dizziness but also ongoing SOB/DOE and edema at times. RHC as mentioned above will help clafiy filling pressures     Shared Decision Making/Informed Consent{ [phone] risks [stroke (1 in 1000), death (1 in 178, kidney failure [usually temporary] (1 in 500), bleeding (1 in 200), allergic reaction [possibly serious] (1 in 200)], benefits (diagnostic support and management of coronary artery disease) and alternatives of a cardiac catheterization were discussed in detail with Ms. Amalfitano and she is willing to proceed.    COVID-19 Education: The signs and symptoms of COVID-19 were discussed with the patient and how to seek care for testing (follow up with PCP or arrange E-visit).  The importance of social distancing was discussed today.  Time:   Today, I have spent 22 minutes with the patient with telehealth technology discussing the above problems.     Medication Adjustments/Labs and Tests Ordered: Current medicines are reviewed at length with the patient today.  Concerns regarding medicines are outlined above.   Tests Ordered: No orders of the defined types were placed in this encounter.   Medication Changes: No orders of the defined types were placed in this encounter.   Follow Up:  In Person 6 weeks  Signed, BCarlyle Dolly MD  09/15/2020 1:54 PM    [facility]

## 2020-09-15 NOTE — Progress Notes (Signed)
Virtual Visit via Telephone Note   This visit type was conducted due to national recommendations for restrictions regarding the COVID-19 Pandemic (e.g. social distancing) in an effort to limit this patient's exposure and mitigate transmission in our community.  Due to her co-morbid illnesses, this patient is at least at moderate risk for complications without adequate follow up.  This format is felt to be most appropriate for this patient at this time.  The patient did not have access to video technology/had technical difficulties with video requiring transitioning to audio format only (telephone).  All issues noted in this document were discussed and addressed.  No physical exam could be performed with this format.  Please refer to the patient's chart for her  consent to telehealth for [facility].    Date:  09/15/2020   ID:  Amber Reeves, DOB 1950/03/23, MRN [id number] The patient was identified using 2 identifiers.  Patient Location: Home Provider Location: Office/Clinic   PCP:  Janora Norlander, [place] Providers Cardiologist:  Carlyle Dolly, MD     Evaluation Performed:  Follow-Up Visit  Chief Complaint:  Chest pain, SOB  History of Present Illness:    Amber Reeves is a 70 y.o. female with seen today for follow up of the following medical problems.    1. CAD -11/2017 cath at [facility] showed >70% mid LAD, otherwise patent vessels. She presented with chest pain, not an MI - received DES to LAD - 11/2017 echo  LVEF Complete echocardiogram shows a left ventricular EF by visual approximation 60 to 65%. Normal left ventricular systolic function. Grade 1 left ventricular diastolic function. No significant valvular disease. No pericardial effusion   01/2018 stress test: THR not achieved, exercise limiting angina , duke treadmill score neg 1   - she reports prior false negative stress tests in the past.     - 09/04/18 nuclear stress by prior  cardiollogist showed anterior breast artifact, probably normal, Jan 2021 cath: mild nonobstructive disease.      -06/2020 echo LVEF 65-70%, grade II dd, - lasix changed to 56m daily    - last visit reported pressure midchest, 5/10 in severity. Can last just a second or two, can have lingering pain about 20 minutes. Can occur at rest or with activity. +headache, +SOB. Has not tried nitro - isosorbide increased last visit, improved symptoms. - occurs every few days  - tried ranexa, reported dizziness, headache and fatigue - higher imdur dose causes dizziness - ongoing chest pain and SOB/DOE. DOE just washing dishes.        2. HTN - renal artery angio at time of 11/2017 cath showed no significant renal artery disease      3. Hyperlipidemia - she is compliant with statin.      4. OSA on cpap - she attributes getting pneumonia when on cpap, she stopped wearing. - does not want to wear cpap       5. Chronic diastolic HF - SOB unchanged     The patient does not have symptoms concerning for COVID-19 infection (fever, chills, cough, or new shortness of breath).    Past Medical History:  Diagnosis Date   Allergy    Anxiety    Arthritis    Asthma    Depression    Diabetes mellitus without complication (HCC)    GERD (gastroesophageal reflux disease)    Hyperlipidemia    Hypertension    Past Surgical History:  Procedure Laterality Date  ABDOMINAL HYSTERECTOMY     BREAST EXCISIONAL BIOPSY     CESAREAN SECTION     3   CHOLECYSTECTOMY  2018   CORONARY STENT PLACEMENT  10/2017   [facility])   LEFT HEART CATH AND CORONARY ANGIOGRAPHY N/A 02/25/2019   Procedure: LEFT HEART CATH AND CORONARY ANGIOGRAPHY;  Surgeon: Nelva Bush, MD;  Location: [place] [facility];  Service: Cardiovascular;  Laterality: N/A;   REPLACEMENT TOTAL KNEE BILATERAL Bilateral    done in [place] (2003/2005)     Current Meds  Medication Sig   albuterol (VENTOLIN HFA) 108  (90 Base) MCG/ACT inhaler TAKE 2 PUFFS BY MOUTH EVERY 4 HOURS AS NEEDED FOR WHEEZE   Alcohol Swabs (B-D SINGLE USE SWABS REGULAR) PADS Apply topically.   aspirin EC 81 MG tablet Take 81 mg by mouth 2 (two) times daily.   atorvastatin (LIPITOR) 40 MG tablet Take 1 tablet (40 mg total) by mouth daily at 6 PM.   Blood Glucose Calibration (ACCU-CHEK AVIVA) SOLN 2 Bottles by Other route as needed.   budesonide-formoterol (SYMBICORT) 160-4.5 MCG/ACT inhaler Inhale 2 puffs into the lungs 2 (two) times daily.   diphenhydrAMINE (BENADRYL) 25 MG tablet Take 25 mg by mouth every 6 (six) hours as needed for allergies.   docusate sodium (COLACE) 100 MG capsule Take 100 mg by mouth 2 (two) times daily as needed (constipation.).    fluticasone (FLONASE) 50 MCG/ACT nasal spray Place 2 sprays into both nostrils daily.   furosemide (LASIX) 20 MG tablet Take 20 mg by mouth daily.   glucose blood test strip TEST BLOOD SUGAR TWICE DAILY   insulin degludec (TRESIBA FLEXTOUCH) 200 UNIT/ML FlexTouch Pen Inject 40 Units into the skin daily.   Insulin Syringe-Needle U-100 31G X 5/16" 0.5 ML MISC Use to inject Insulin four times daily: Use as directed; Dx: E11.9, E66.9   ipratropium-albuterol (DUONEB) 0.5-2.5 (3) MG/3ML SOLN TAKE 3 ML (1 VIAL) BY NEBULIZATION EVERY 6 HOURS AS NEEDED FOR WHEEZING.   isosorbide mononitrate (IMDUR) 60 MG 24 hr tablet Take 2 tablets (120 mg total) by mouth daily.   lansoprazole (PREVACID) 30 MG capsule Take 30 mg by mouth at bedtime.    lisinopril (ZESTRIL) 40 MG tablet Take 1 tablet (40 mg total) by mouth daily.   magnesium oxide (MAG-OX) 400 MG tablet Take magnesium on days you take lasix   metoprolol tartrate (LOPRESSOR) 25 MG tablet Take 1 tablet (25 mg total) by mouth 2 (two) times daily.   montelukast (SINGULAIR) 10 MG tablet Take 1 tablet (10 mg total) by mouth at bedtime.   nitroGLYCERIN (NITROSTAT) 0.4 MG SL tablet Place 1 tablet (0.4 mg total) under the tongue every 5 (five) minutes  as needed for chest pain.   pioglitazone (ACTOS) 30 MG tablet Take 1 tablet (30 mg total) by mouth daily.   potassium chloride (KLOR-CON M10) 10 MEQ tablet TAKE 1 TABLET (10 MEQ TOTAL) BY MOUTH EVERY EVENING.   Current Facility-Administered Medications for the 09/15/20 encounter (Telemedicine) with Arnoldo Lenis, MD  Medication   sodium chloride flush (NS) 0.9 % injection 3 mL     Allergies:   Aspartame, Penicillins, Farxiga [dapagliflozin], Metformin and related, No healthtouch food allergies, Paxil [paroxetine hcl], Prozac [fluoxetine hcl], and Zoloft [sertraline hcl]   Social History   Tobacco Use   Smoking status: Never   Smokeless tobacco: Never  Vaping Use   Vaping Use: Never used  Substance Use Topics   Alcohol use: Never  Drug use: Never     Family Hx: The patient's family history includes Cancer in her father; Heart disease in her mother; Hypertension in her mother; Stroke in her father and mother.  ROS:   Please see the history of present illness.     All other systems reviewed and are negative.   Prior CV studies:   The following studies were reviewed today:  11/2017 cath [facility] Severe >70% long mid LAD stenosis No other significant angiographic stenosis in the other coronary arteries  DES to mid LAD   Malignant hypertension - selective renal angio without any evidence of  renal artery stenosis.   Renal angio procedure:  A Tiger 4 catheter was used to selectively engage both the left and right  renal arteries and was used to perform selective angiography.    Renal artery anatomy:  The left kidney is supplied by a single renal artery which has no  angiographic stenosis  The right kidney is supplied by a single rena artery which has no  angiographic stenosis     Jan 2021 Mild, non-obstructive coronary artery disease. Widely patent proximal/mid LAD stent. Normal left ventricular systolic function with upper normal to mildly elevated  filling pressure (LVEDP ~15 mmHg).   06/2020 echo  1. Left ventricular ejection fraction, by estimation, is 65 to 70%. The  left ventricle has normal function. Left ventricular endocardial border  not optimally defined to evaluate regional wall motion. There is mild left  ventricular hypertrophy. Left  ventricular diastolic parameters are consistent with Grade II diastolic  dysfunction (pseudonormalization). Normal global longitudinal strain of  -19.8%.   2. Right ventricular systolic function is normal. The right ventricular  size is normal. There is normal pulmonary artery systolic pressure. The  estimated right ventricular systolic pressure is 26.3 mmHg.   3. Left atrial size was mildly dilated.   4. Right atrial size was mildly dilated.   5. The mitral valve is grossly normal. Trivial mitral valve  regurgitation.   6. The aortic valve has an indeterminant number of cusps. Aortic valve  regurgitation is not visualized.   7. The inferior vena cava is normal in size with greater than 50%  respiratory variability, suggesting right atrial pressure of 3 mmHg.      Labs/Other Tests and Data Reviewed:    EKG:  No ECG reviewed.  Recent Labs: 11/12/2019: ALT 23 08/20/2020: BUN 12; Creatinine, Ser 0.97; Magnesium 1.6; Potassium 4.3; Sodium 141   Recent Lipid Panel Lab Results  Component Value Date/Time   CHOL 151 11/12/2019 03:20 PM   TRIG 208 (H) 11/12/2019 03:20 PM   HDL 45 11/12/2019 03:20 PM   CHOLHDL 3.4 11/12/2019 03:20 PM   LDLCALC 72 11/12/2019 03:20 PM    Wt Readings from Last 3 Encounters:  09/15/20 215 lb (97.5 kg)  08/17/20 231 lb 9.6 oz (105.1 kg)  07/16/20 228 lb (103.4 kg)     Risk Assessment/Calculations:          Objective:    Vital Signs:  BP 127/64   Pulse 60   Ht 5' 3"  (1.6 m)   Wt 215 lb (97.5 kg)   BMI 38.09 kg/m    Normal affect. NOrmal speech pattern and tone. Comfortable, no apparent distress. No audible signs of sob or  wheezing  ASSESSMENT & PLAN:    1. CAD - history of prior stent to LAD - Jan 2021 cath shows no significant disease - has had issues with inaccurate stress tests in  the past - ongoign chest pains and SOB. Did not tolerate higher imdur doses, did not tolerate ranexa. - givein ongoing symptoms will plan for left and right heart cath, right heart cath given the degree of her dyspnea combined with her diastolic HF and difficult to assess volume status.    2. Chronic diastolic HF - uptrend in Cr since she started taking lasix 19m daily, had been on prn before. - she has some orthostatic dizziness but also ongoing SOB/DOE and edema at times. RHC as mentioned above will help clafiy filling pressures     Shared Decision Making/Informed Consent{ [id number] risks [stroke (1 in 1000), death (1 in 172, kidney failure [usually temporary] (1 in 500), bleeding (1 in 200), allergic reaction [possibly serious] (1 in 200)], benefits (diagnostic support and management of coronary artery disease) and alternatives of a cardiac catheterization were discussed in detail with Ms. Woo and she is willing to proceed.    COVID-19 Education: The signs and symptoms of COVID-19 were discussed with the patient and how to seek care for testing (follow up with PCP or arrange E-visit).  The importance of social distancing was discussed today.  Time:   Today, I have spent 22 minutes with the patient with telehealth technology discussing the above problems.     Medication Adjustments/Labs and Tests Ordered: Current medicines are reviewed at length with the patient today.  Concerns regarding medicines are outlined above.   Tests Ordered: No orders of the defined types were placed in this encounter.   Medication Changes: No orders of the defined types were placed in this encounter.   Follow Up:  In Person 6 weeks  Signed, BCarlyle Dolly MD  09/15/2020 1:54 PM    [facility]

## 2020-09-15 NOTE — Telephone Encounter (Signed)
Pt is agreeable with VV today at 2:20 pm.

## 2020-09-15 NOTE — Telephone Encounter (Signed)
Can she do a virtual visit today at 220pm to discuss symptoms and possible testing. We can accomplish what we need to just on a phone call   Amber Abts MD

## 2020-09-15 NOTE — Patient Instructions (Addendum)
Medication Instructions:  Your physician has recommended you make the following change in your medication:  STOP RENEXA  *If you need a refill on your cardiac medications before your next appointment, please call your pharmacy*   Lab Work: CBC  BMET  If you have labs (blood work) drawn today and your tests are completely normal, you will receive your results only by: [place] (if you have MyChart) OR A paper copy in the mail If you have any lab test that is abnormal or we need to change your treatment, we will call you to review the results.   Testing/Procedures: L/R Heart Cath  Follow-Up: At [facility], you and your health needs are our priority.  As part of our continuing mission to provide you with exceptional heart care, we have created designated Provider Care Teams.  These Care Teams include your primary Cardiologist (physician) and Advanced Practice Providers (APPs -  Physician Assistants and Nurse Practitioners) who all work together to provide you with the care you need, when you need it.  We recommend signing up for the patient portal called "MyChart".  Sign up information is provided on this After Visit Summary.  MyChart is used to connect with patients for Virtual Visits (Telemedicine).  Patients are able to view lab/test results, encounter notes, upcoming appointments, etc.  Non-urgent messages can be sent to your provider as well.   To learn more about what you can do with MyChart, go to [email or web address].    Your next appointment:   6 week(s)  The format for your next appointment:   In Person  Provider:   You will see one of the following Advanced Practice Providers on your designated Care Team:   Bernerd Pho, PA-C  Ermalinda Barrios, [place]     Other Instructions  [place] [address] Dept: [phone] Loc: [place]  09/15/2020  You are scheduled for a Cardiac Catheterization on Tuesday, August 2 with Dr. Glenetta Hew.  1. Please arrive at the [facility] (Main Entrance A) at [facility]: [address] [place], [place] [address] at 5:30 AM (This time is two hours before your procedure to ensure your preparation). Free valet parking service is available.   Special note: Every effort is made to have your procedure done on time. Please understand that emergencies sometimes delay scheduled procedures.  2. Diet: Do not eat solid foods after midnight.  The patient may have clear liquids until 5am upon the day of the procedure.  3. Labs: You will need to have blood drawn on Thursday, July 27 at [place]. [address], [place]  Open: 7am - 6pm, Sat 8am - 12 noon   Phone: [phone]. You do not need to be fasting.  4. Medication instructions in preparation for your procedure:   Contrast Allergy: No  Stop taking, Lasix (Furosemide)  Monday, August 1,  Take only 20 units of insulin the night before your procedure. Do not take any insulin on the day of the procedure.   On the morning of your procedure, take your Aspirin and any morning medicines NOT listed above.  You may use sips of water.  5. Plan for one night stay--bring personal belongings. 6. Bring a current list of your medications and current insurance cards. 7. You MUST have a responsible person to drive you home. 8. Someone MUST be with you the first 24 hours  after you arrive home or your discharge will be delayed. 9. Please wear clothes that are easy to get on and off and wear slip-on shoes.  Thank you for allowing [place] to care for you!   -- [facility] [place] [facility]

## 2020-09-16 ENCOUNTER — Other Ambulatory Visit (HOSPITAL_COMMUNITY)
Admission: RE | Admit: 2020-09-16 | Discharge: 2020-09-16 | Disposition: A | Discharge status: Home / Self Care | Payer: Medicare HMO | Source: Ambulatory Visit | Attending: Cardiology | Admitting: Cardiology

## 2020-09-16 ENCOUNTER — Other Ambulatory Visit: Payer: Self-pay

## 2020-09-16 DIAGNOSIS — Z01818 Encounter for other preprocedural examination: Secondary | ICD-10-CM

## 2020-09-16 LAB — CBC
HCT: 39.8 % (ref 36.0–46.0)
Hemoglobin: 12.6 g/dL (ref 12.0–15.0)
MCH: 32 pg (ref 26.0–34.0)
MCHC: 31.7 g/dL (ref 30.0–36.0)
MCV: 101 fL — ABNORMAL HIGH (ref 80.0–100.0)
Platelets: 232 10*3/uL (ref 150–400)
RBC: 3.94 MIL/uL (ref 3.87–5.11)
RDW: 14.5 % (ref 11.5–15.5)
WBC: 7.4 10*3/uL (ref 4.0–10.5)
nRBC: 0 % (ref 0.0–0.2)

## 2020-09-16 LAB — BASIC METABOLIC PANEL
Anion gap: 6 (ref 5–15)
BUN: 23 mg/dL (ref 8–23)
CO2: 24 mmol/L (ref 22–32)
Calcium: 8.5 mg/dL — ABNORMAL LOW (ref 8.9–10.3)
Chloride: 104 mmol/L (ref 98–111)
Creatinine, Ser: 1.22 mg/dL — ABNORMAL HIGH (ref 0.44–1.00)
GFR, Estimated: 48 mL/min — ABNORMAL LOW (ref >60)
Glucose, Bld: 165 mg/dL — ABNORMAL HIGH (ref 70–99)
Potassium: 3.8 mmol/L (ref 3.5–5.1)
Sodium: 134 mmol/L — ABNORMAL LOW (ref 135–145)

## 2020-09-17 NOTE — Progress Notes (Signed)
Patient is aware and has appt scheduled

## 2020-09-19 ENCOUNTER — Ambulatory Visit
Admission: RE | Admit: 2020-09-19 | Discharge: 2020-09-19 | Disposition: A | Discharge status: Home / Self Care | Payer: Medicare HMO | Source: Ambulatory Visit | Attending: Family Medicine | Admitting: Family Medicine

## 2020-09-19 ENCOUNTER — Other Ambulatory Visit: Payer: Self-pay | Admitting: Family Medicine

## 2020-09-19 DIAGNOSIS — R922 Inconclusive mammogram: Secondary | ICD-10-CM | POA: Diagnosis not present

## 2020-09-19 DIAGNOSIS — R921 Mammographic calcification found on diagnostic imaging of breast: Secondary | ICD-10-CM | POA: Diagnosis not present

## 2020-09-19 DIAGNOSIS — R928 Other abnormal and inconclusive findings on diagnostic imaging of breast: Secondary | ICD-10-CM

## 2020-09-21 ENCOUNTER — Ambulatory Visit: Payer: Medicare HMO | Admitting: Family Medicine

## 2020-09-21 ENCOUNTER — Telehealth: Payer: Self-pay | Admitting: *Deleted

## 2020-09-21 NOTE — Telephone Encounter (Signed)
Pt contacted pre-catheterization scheduled at [facility] for: Tuesday September 22, 2020 7:30 AM Verified arrival time and place: [facility]) at: 5:30 AM   No solid food after midnight prior to cath, clear liquids until 5 AM day of procedure.  Hold: Lasix/KCl-day before and day of procedure-GFR 48 Lisinopril-day before and day of procedure-GFR 48 Actos-AM of procedure Insulin-AM of procedure Ibuprofen-day before and day of procedure-GFR 48  Except hold medications AM meds can be  taken pre-cath with sips of water including:   aspirin 81 mg   Confirmed patient has responsible adult to drive home post procedure and be with patient first 24 hours after arriving home.  Patients are allowed one visitor in the waiting room during the time they are at the hospital for their procedure. Both patient and visitor must wear a mask once they enter the hospital.   Patient reports does not currently have any symptoms concerning for COVID-19 and no household members with COVID-19 like illness.      Reviewed procedure/mask/visitor instructions with patient.

## 2020-09-22 ENCOUNTER — Other Ambulatory Visit: Payer: Self-pay

## 2020-09-22 ENCOUNTER — Ambulatory Visit (HOSPITAL_COMMUNITY)
Admission: RE | Admit: 2020-09-22 | Discharge: 2020-09-22 | Disposition: A | Discharge status: Home / Self Care | Payer: Medicare HMO | Attending: Cardiology | Admitting: Cardiology

## 2020-09-22 ENCOUNTER — Telehealth: Payer: Self-pay | Admitting: Cardiology

## 2020-09-22 ENCOUNTER — Encounter (HOSPITAL_COMMUNITY)
Admission: RE | Disposition: A | Discharge status: Home / Self Care | Payer: Self-pay | Source: Home / Self Care | Attending: Cardiology

## 2020-09-22 ENCOUNTER — Encounter (HOSPITAL_COMMUNITY): Payer: Self-pay | Admitting: Cardiology

## 2020-09-22 DIAGNOSIS — Z9861 Coronary angioplasty status: Secondary | ICD-10-CM

## 2020-09-22 DIAGNOSIS — E785 Hyperlipidemia, unspecified: Secondary | ICD-10-CM | POA: Insufficient documentation

## 2020-09-22 DIAGNOSIS — R0600 Dyspnea, unspecified: Secondary | ICD-10-CM | POA: Diagnosis not present

## 2020-09-22 DIAGNOSIS — I209 Angina pectoris, unspecified: Secondary | ICD-10-CM | POA: Diagnosis not present

## 2020-09-22 DIAGNOSIS — Z794 Long term (current) use of insulin: Secondary | ICD-10-CM | POA: Insufficient documentation

## 2020-09-22 DIAGNOSIS — Z79899 Other long term (current) drug therapy: Secondary | ICD-10-CM | POA: Insufficient documentation

## 2020-09-22 DIAGNOSIS — Z955 Presence of coronary angioplasty implant and graft: Secondary | ICD-10-CM | POA: Insufficient documentation

## 2020-09-22 DIAGNOSIS — I5032 Chronic diastolic (congestive) heart failure: Secondary | ICD-10-CM | POA: Diagnosis not present

## 2020-09-22 DIAGNOSIS — Z96653 Presence of artificial knee joint, bilateral: Secondary | ICD-10-CM | POA: Insufficient documentation

## 2020-09-22 DIAGNOSIS — Z8249 Family history of ischemic heart disease and other diseases of the circulatory system: Secondary | ICD-10-CM | POA: Diagnosis not present

## 2020-09-22 DIAGNOSIS — R0609 Other forms of dyspnea: Secondary | ICD-10-CM

## 2020-09-22 DIAGNOSIS — Z7951 Long term (current) use of inhaled steroids: Secondary | ICD-10-CM | POA: Insufficient documentation

## 2020-09-22 DIAGNOSIS — I25119 Atherosclerotic heart disease of native coronary artery with unspecified angina pectoris: Secondary | ICD-10-CM | POA: Diagnosis not present

## 2020-09-22 DIAGNOSIS — Z888 Allergy status to other drugs, medicaments and biological substances status: Secondary | ICD-10-CM | POA: Insufficient documentation

## 2020-09-22 DIAGNOSIS — Z90710 Acquired absence of both cervix and uterus: Secondary | ICD-10-CM | POA: Insufficient documentation

## 2020-09-22 DIAGNOSIS — Z7982 Long term (current) use of aspirin: Secondary | ICD-10-CM | POA: Diagnosis not present

## 2020-09-22 DIAGNOSIS — E119 Type 2 diabetes mellitus without complications: Secondary | ICD-10-CM | POA: Diagnosis not present

## 2020-09-22 DIAGNOSIS — G4733 Obstructive sleep apnea (adult) (pediatric): Secondary | ICD-10-CM | POA: Insufficient documentation

## 2020-09-22 DIAGNOSIS — I110 Hypertensive heart disease with heart failure: Secondary | ICD-10-CM | POA: Insufficient documentation

## 2020-09-22 DIAGNOSIS — Z9049 Acquired absence of other specified parts of digestive tract: Secondary | ICD-10-CM | POA: Insufficient documentation

## 2020-09-22 DIAGNOSIS — Z880 Allergy status to penicillin: Secondary | ICD-10-CM | POA: Diagnosis not present

## 2020-09-22 DIAGNOSIS — I2510 Atherosclerotic heart disease of native coronary artery without angina pectoris: Secondary | ICD-10-CM

## 2020-09-22 HISTORY — PX: RIGHT/LEFT HEART CATH AND CORONARY ANGIOGRAPHY: CATH118266

## 2020-09-22 LAB — POCT I-STAT EG7
Acid-base deficit: 1 mmol/L (ref 0.0–2.0)
Acid-base deficit: 1 mmol/L (ref 0.0–2.0)
Bicarbonate: 24.7 mmol/L (ref 20.0–28.0)
Bicarbonate: 24.3 mmol/L (ref 20.0–28.0)
Calcium, Ion: 1.2 mmol/L (ref 1.15–1.40)
Calcium, Ion: 1.18 mmol/L (ref 1.15–1.40)
HCT: 31 % — ABNORMAL LOW (ref 36.0–46.0)
HCT: 31 % — ABNORMAL LOW (ref 36.0–46.0)
Hemoglobin: 10.5 g/dL — ABNORMAL LOW (ref 12.0–15.0)
Hemoglobin: 10.5 g/dL — ABNORMAL LOW (ref 12.0–15.0)
O2 Saturation: 80 %
O2 Saturation: 81 %
Potassium: 3.8 mmol/L (ref 3.5–5.1)
Potassium: 3.8 mmol/L (ref 3.5–5.1)
Sodium: 142 mmol/L (ref 135–145)
Sodium: 143 mmol/L (ref 135–145)
TCO2: 26 mmol/L (ref 22–32)
TCO2: 26 mmol/L (ref 22–32)
pCO2, Ven: 43.5 mmHg — ABNORMAL LOW (ref 44.0–60.0)
pCO2, Ven: 42.9 mmHg — ABNORMAL LOW (ref 44.0–60.0)
pH, Ven: 7.363 (ref 7.250–7.430)
pH, Ven: 7.361 (ref 7.250–7.430)
pO2, Ven: 46 mmHg — ABNORMAL HIGH (ref 32.0–45.0)
pO2, Ven: 47 mmHg — ABNORMAL HIGH (ref 32.0–45.0)

## 2020-09-22 LAB — POCT I-STAT 7, (LYTES, BLD GAS, ICA,H+H)
Acid-Base Excess: 0 mmol/L (ref 0.0–2.0)
Bicarbonate: 24.4 mmol/L (ref 20.0–28.0)
Calcium, Ion: 1.21 mmol/L (ref 1.15–1.40)
HCT: 31 % — ABNORMAL LOW (ref 36.0–46.0)
Hemoglobin: 10.5 g/dL — ABNORMAL LOW (ref 12.0–15.0)
O2 Saturation: 99 %
Potassium: 3.9 mmol/L (ref 3.5–5.1)
Sodium: 142 mmol/L (ref 135–145)
TCO2: 26 mmol/L (ref 22–32)
pCO2 arterial: 40.1 mmHg (ref 32.0–48.0)
pH, Arterial: 7.393 (ref 7.350–7.450)
pO2, Arterial: 140 mmHg — ABNORMAL HIGH (ref 83.0–108.0)

## 2020-09-22 LAB — GLUCOSE, CAPILLARY
Glucose-Capillary: 127 mg/dL — ABNORMAL HIGH (ref 70–99)
Glucose-Capillary: 109 mg/dL — ABNORMAL HIGH (ref 70–99)

## 2020-09-22 SURGERY — RIGHT/LEFT HEART CATH AND CORONARY ANGIOGRAPHY
Anesthesia: LOCAL

## 2020-09-22 MED ORDER — ASPIRIN 81 MG PO CHEW: 81.0000 mg | CHEWABLE_TABLET | ORAL | Status: AC

## 2020-09-22 MED ORDER — ACETAMINOPHEN 325 MG PO TABS: 650.0000 mg | ORAL_TABLET | ORAL | Status: DC | PRN

## 2020-09-22 MED ORDER — SODIUM CHLORIDE 0.9 % WEIGHT BASED INFUSION
3.0000 mL/kg/h | INTRAVENOUS | Status: AC
  Administered 2020-09-22: 3 mL/kg/h via INTRAVENOUS

## 2020-09-22 MED ORDER — LABETALOL HCL 5 MG/ML IV SOLN: 10.0000 mg | INTRAVENOUS | Status: DC | PRN

## 2020-09-22 MED ORDER — SODIUM CHLORIDE 0.9% FLUSH: 3.0000 mL | Freq: Two times a day (BID) | INTRAVENOUS | Status: DC

## 2020-09-22 MED ORDER — SODIUM CHLORIDE 0.9% FLUSH: 3.0000 mL | INTRAVENOUS | Status: DC | PRN

## 2020-09-22 MED ORDER — HEPARIN SODIUM (PORCINE) 1000 UNIT/ML IJ SOLN
INTRAMUSCULAR | Status: DC | PRN
  Administered 2020-09-22: 5000 [IU] via INTRAVENOUS

## 2020-09-22 MED ORDER — MIDAZOLAM HCL 2 MG/2ML IJ SOLN
INTRAMUSCULAR | Status: AC
  Filled 2020-09-22: qty 2

## 2020-09-22 MED ORDER — ONDANSETRON HCL 4 MG/2ML IJ SOLN: 4.0000 mg | Freq: Four times a day (QID) | INTRAMUSCULAR | Status: DC | PRN

## 2020-09-22 MED ORDER — VERAPAMIL HCL 2.5 MG/ML IV SOLN
INTRAVENOUS | Status: DC | PRN
  Administered 2020-09-22: 10 mL via INTRA_ARTERIAL

## 2020-09-22 MED ORDER — MIDAZOLAM HCL 2 MG/2ML IJ SOLN
INTRAMUSCULAR | Status: DC | PRN
  Administered 2020-09-22: 1 mg via INTRAVENOUS

## 2020-09-22 MED ORDER — FENTANYL CITRATE (PF) 100 MCG/2ML IJ SOLN
INTRAMUSCULAR | Status: AC
  Filled 2020-09-22: qty 2

## 2020-09-22 MED ORDER — SODIUM CHLORIDE 0.9 % IV SOLN: INTRAVENOUS | Status: DC

## 2020-09-22 MED ORDER — IOHEXOL 350 MG/ML SOLN
INTRAVENOUS | Status: DC | PRN
  Administered 2020-09-22: 35 mL

## 2020-09-22 MED ORDER — LIDOCAINE HCL (PF) 1 % IJ SOLN
INTRAMUSCULAR | Status: AC
  Filled 2020-09-22: qty 30

## 2020-09-22 MED ORDER — SODIUM CHLORIDE 0.9 % IV SOLN: 250.0000 mL | INTRAVENOUS | Status: DC | PRN

## 2020-09-22 MED ORDER — SODIUM CHLORIDE 0.9 % WEIGHT BASED INFUSION: 1.0000 mL/kg/h | INTRAVENOUS | Status: DC

## 2020-09-22 MED ORDER — HEPARIN (PORCINE) IN NACL 1000-0.9 UT/500ML-% IV SOLN
INTRAVENOUS | Status: DC | PRN
  Administered 2020-09-22 (×2): 500 mL

## 2020-09-22 MED ORDER — HEPARIN SODIUM (PORCINE) 1000 UNIT/ML IJ SOLN
INTRAMUSCULAR | Status: AC
  Filled 2020-09-22: qty 1

## 2020-09-22 MED ORDER — LIDOCAINE HCL (PF) 1 % IJ SOLN
INTRAMUSCULAR | Status: DC | PRN
  Administered 2020-09-22: 4 mL

## 2020-09-22 MED ORDER — HYDRALAZINE HCL 20 MG/ML IJ SOLN: 10.0000 mg | INTRAMUSCULAR | Status: DC | PRN

## 2020-09-22 MED ORDER — FENTANYL CITRATE (PF) 100 MCG/2ML IJ SOLN
INTRAMUSCULAR | Status: DC | PRN
  Administered 2020-09-22: 25 ug via INTRAVENOUS

## 2020-09-22 MED ORDER — VERAPAMIL HCL 2.5 MG/ML IV SOLN
INTRAVENOUS | Status: AC
  Filled 2020-09-22: qty 2

## 2020-09-22 SURGICAL SUPPLY — 13 items
CATH BALLN WEDGE 5F 110CM (CATHETERS) ×2 IMPLANT
CATH OPTITORQUE TIG 4.0 5F (CATHETERS) ×2 IMPLANT
DEVICE RAD COMP TR BAND LRG (VASCULAR PRODUCTS) ×2 IMPLANT
GLIDESHEATH SLEND SS 6F .021 (SHEATH) ×2 IMPLANT
GUIDEWIRE INQWIRE 1.5J.035X260 (WIRE) ×1 IMPLANT
INQWIRE 1.5J .035X260CM (WIRE) ×2
KIT HEART LEFT (KITS) ×2 IMPLANT
KIT MICROPUNCTURE NIT STIFF (SHEATH) ×2 IMPLANT
PACK CARDIAC CATHETERIZATION (CUSTOM PROCEDURE TRAY) ×2 IMPLANT
SHEATH GLIDE SLENDER 4/5FR (SHEATH) ×2 IMPLANT
TRANSDUCER W/STOPCOCK (MISCELLANEOUS) ×2 IMPLANT
TUBING CIL FLEX 10 FLL-RA (TUBING) ×2 IMPLANT
WIRE MICROINTRODUCER 60CM (WIRE) ×2 IMPLANT

## 2020-09-22 NOTE — Telephone Encounter (Signed)
Patient returned Cathey's call for lab results. Please call her back she was having heart cath this morning the reason you could not reach her. She can be reached at [phone]

## 2020-09-22 NOTE — Interval H&P Note (Signed)
History and Physical Interval Note:  09/22/2020 7:25 AM  Amber Reeves  has presented today for surgery, with the diagnosis of chest pain - Class III angina (per description).  The various methods of treatment have been discussed with the patient and family. After consideration of risks, benefits and other options for treatment, the patient has consented to  Procedure(s): RIGHT/LEFT HEART CATH AND CORONARY ANGIOGRAPHY (N/A)  PERCUTANEOUS CORONARY INTERVENTION   as a surgical intervention.  The patient's history has been reviewed, patient examined, no change in status, stable for surgery.  I have reviewed the patient's chart and labs.  Questions were answered to the patient's satisfaction.    Cath Lab Visit (complete for each Cath Lab visit)  Clinical Evaluation Leading to the Procedure:   ACS: No.  Non-ACS:    Anginal Classification: CCS III  Anti-ischemic medical therapy: Maximal Therapy (2 or more classes of medications)  Non-Invasive Test Results: No non-invasive testing performed  Prior CABG: No previous CABG   Glenetta Hew

## 2020-09-22 NOTE — Telephone Encounter (Signed)
-----   Message from Arnoldo Lenis, MD sent at 09/21/2020  5:09 PM EDT ----- Labs show mildly decreased kidney function, we will see what her eart cath shows regarding fluid status and adjust her fluid pill accordingly.    Zandra Abts MD

## 2020-09-22 NOTE — Telephone Encounter (Signed)
Pt notified and verbalized understanding. Pt had no questions or concerns at this time.

## 2020-09-25 ENCOUNTER — Telehealth: Payer: Self-pay | Admitting: Cardiology

## 2020-09-25 NOTE — Telephone Encounter (Signed)
Mrs.Geron will bring her cardiac meds to visit on Monday, 09/28/20.

## 2020-09-25 NOTE — Telephone Encounter (Signed)
New message    Patient has appointment on Monday with Jonni Sanger  - patient said she is still not feeling right after having cath.  She is still having headache and dizziness, she said she is having problems with her heart.  They told her when she had her heart cath that she needed to have meds adjusted, she has been trying to self adjust her medication and it is not helping the situation.

## 2020-09-27 NOTE — Progress Notes (Signed)
Cardiology Office Note  Date: 09/28/2020   ID: [name], [name] 01-29-1951, MRN [id number]  PCP:  Janora Norlander, DO  Cardiologist:  Carlyle Dolly, MD Electrophysiologist:  None   Chief Complaint: Headache / Dizziness / Having issues with heart. Recent Cardiac catheterization.  History of Present Illness: Amber Reeves is a 70 y.o. female with a history of CAD, DM2, HTN, HLD, GERD.   She was last seen by Dr. Harl Bowie via telemedicine on 09/15/2020 for complaints of chest pain and shortness of breath.  At a prior visit she had reported chest pressure mid chest with 5 out of 10 in severity lasting a second or 2.  Could have lingering pain about 20 minutes.  Could occur at rest could have a headache.  She had not tried nitro.  Isorbide had been increased at prior visit with improved symptoms.  She reported dizziness, headaches and fatigue on higher doses of Imdur.  She was having ongoing chest pain and shortness of breath with DOE when washing dishes.  She had stopped wearing her CPAP and did not want to continue.  Her shortness of breath was unchanged.  Plan was for a left and right heart cath given degree of dyspnea.  She had experienced an upward trend in creatinine since starting Lasix 20 mg daily.  She had previously been on as needed Lasix prior.  She was having some orthostatic dizziness with shortness of breath/DOE and edema at times.  She is here today status post cardiac catheterization and complaining of headache and dizziness.  She states she started Ranexa and had a significant reaction to Ranexa which has been stopped.  She states she has an occasional transient sharp chest chest pain which is random and not associated with exertion.  She denies any radiation or associated symptoms.  She states she was on a higher dose of Imdur.  She is currently on 120 mg of Imdur and states this has improved her headaches and dizziness since being on lower dose.  Blood pressure is  elevated today at 146/64.  Recent cardiac catheterization on September 22, 2020 showed stable single-vessel CAD with widely patent LAD stent.  She had some proximal LAD stenosis of 15%, proximal circumflex lesion 15%, ostial RCA lesion 20%.  Mild to moderately elevated LVEDP and PCWP with normal right heart Pressures and no evidence of pulmonary hypertension. Cardiac catheterization note mentioned consider microvascular disease, hypertensive heart disease as other potential etiologies from a cardiac standpoint for chest pain versus noncardiac chest pain.  Consider calcium channel blocker and beta-blocker titration as opposed to long-acting nitrate.  She states she has some activity intolerance with some shortness of breath on more than usual ADLs. Echocardiogram 07/07/2020 demonstrated EF of 65-70.  Left ventricular endocardial border not optimally defined to evaluate WMA's.  Mild LVH, G2 DD, LA mildly dilated, RA mildly dilated, trivial MR.    Past Medical History:  Diagnosis Date   Allergy    Anxiety    Arthritis    Asthma    Depression    Diabetes mellitus without complication ([place])    GERD (gastroesophageal reflux disease)    Hyperlipidemia    Hypertension     Past Surgical History:  Procedure Laterality Date   ABDOMINAL HYSTERECTOMY     BREAST EXCISIONAL BIOPSY     CESAREAN SECTION     3   CHOLECYSTECTOMY  2018   CORONARY STENT PLACEMENT  10/2017   [facility])   LEFT  HEART CATH AND CORONARY ANGIOGRAPHY N/A 02/25/2019   Procedure: LEFT HEART CATH AND CORONARY ANGIOGRAPHY;  Surgeon: Nelva Bush, MD;  Location: [facility];  Service: Cardiovascular;  Laterality: N/A;   REPLACEMENT TOTAL KNEE BILATERAL Bilateral    done in [place] (2003/2005)   RIGHT/LEFT HEART CATH AND CORONARY ANGIOGRAPHY N/A 09/22/2020   Procedure: RIGHT/LEFT HEART CATH AND CORONARY ANGIOGRAPHY;  Surgeon: Leonie Man, MD;  Location: [place] [facility];  Service: Cardiovascular;   Laterality: N/A;    Current Outpatient Medications  Medication Sig Dispense Refill   albuterol (VENTOLIN HFA) 108 (90 Base) MCG/ACT inhaler TAKE 2 PUFFS BY MOUTH EVERY 4 HOURS AS NEEDED FOR WHEEZE 18 g 3   Alcohol Swabs (B-D SINGLE USE SWABS REGULAR) PADS Apply topically.     aspirin EC 81 MG tablet Take 81 mg by mouth in the morning.     atorvastatin (LIPITOR) 40 MG tablet Take 1 tablet (40 mg total) by mouth daily at 6 PM. (Patient taking differently: Take 40 mg by mouth at bedtime.) 90 tablet 3   Blood Glucose Calibration (ACCU-CHEK AVIVA) SOLN 2 Bottles by Other route as needed.     budesonide-formoterol (SYMBICORT) 160-4.5 MCG/ACT inhaler Inhale 2 puffs into the lungs 2 (two) times daily. 4 each 11   Calcium Carb-Cholecalciferol (CALCIUM 600+D3 PO) Take 1 tablet by mouth in the morning.     diphenhydrAMINE (BENADRYL) 25 MG tablet Take 50 mg by mouth at bedtime.     docusate sodium (COLACE) 100 MG capsule Take 100 mg by mouth 2 (two) times daily as needed (constipation.).      fluticasone (FLONASE) 50 MCG/ACT nasal spray Place 2 sprays into both nostrils daily. (Patient taking differently: Place 2 sprays into both nostrils in the morning.) 48 g 3   furosemide (LASIX) 20 MG tablet Take 20 mg by mouth in the morning.     glucose blood test strip TEST BLOOD SUGAR TWICE DAILY     ibuprofen (ADVIL) 200 MG tablet Take 400 mg by mouth daily as needed (pain (leg pain)).     insulin degludec (TRESIBA FLEXTOUCH) 200 UNIT/ML FlexTouch Pen Inject 40 Units into the skin daily. (Patient taking differently: Inject 40 Units into the skin in the morning.) 18 mL 0   Insulin Syringe-Needle U-100 31G X 5/16" 0.5 ML MISC Use to inject Insulin four times daily: Use as directed; Dx: E11.9, E66.9     ipratropium-albuterol (DUONEB) 0.5-2.5 (3) MG/3ML SOLN TAKE 3 ML (1 VIAL) BY NEBULIZATION EVERY 6 HOURS AS NEEDED FOR WHEEZING. (Patient taking differently: Inhale 3 mLs into the lungs every 6 (six) hours as needed  (wheezing).) 360 mL 0   isosorbide mononitrate (IMDUR) 60 MG 24 hr tablet Take 2 tablets (120 mg total) by mouth daily. (Patient taking differently: Take 60 mg by mouth in the morning and at bedtime.) 180 tablet 3   lansoprazole (PREVACID) 15 MG capsule Take 30 mg by mouth at bedtime.     lisinopril (ZESTRIL) 40 MG tablet Take 1 tablet (40 mg total) by mouth daily. (Patient taking differently: Take 40 mg by mouth in the morning.) 90 tablet 3   magnesium oxide (MAG-OX) 400 MG tablet Take magnesium on days you take lasix (Patient taking differently: Take 400 mg by mouth in the morning.) 60 tablet 3   metoprolol tartrate (LOPRESSOR) 25 MG tablet Take 1 tablet (25 mg total) by mouth 2 (two) times daily. 180 tablet 3   montelukast (SINGULAIR) 10 MG tablet Take 1  tablet (10 mg total) by mouth at bedtime. 90 tablet 3   nitroGLYCERIN (NITROSTAT) 0.4 MG SL tablet Place 1 tablet (0.4 mg total) under the tongue every 5 (five) minutes as needed for chest pain. (Patient taking differently: Place 0.4 mg under the tongue every 5 (five) minutes x 3 doses as needed for chest pain.) 25 tablet 3   pioglitazone (ACTOS) 30 MG tablet Take 1 tablet (30 mg total) by mouth daily. (Patient taking differently: Take 30 mg by mouth in the morning.) 90 tablet 3   potassium chloride (KLOR-CON M10) 10 MEQ tablet TAKE 1 TABLET (10 MEQ TOTAL) BY MOUTH EVERY EVENING. (Patient taking differently: Take 10 mEq by mouth every evening. Marland Kitchen) 90 tablet 3   Current Facility-Administered Medications  Medication Dose Route Frequency Provider Last Rate Last Admin   sodium chloride flush (NS) 0.9 % injection 3 mL  3 mL Intravenous Q12H Branch, Alphonse Guild, MD       Allergies:  Aspartame, Penicillins, Farxiga [dapagliflozin], Metformin and related, No healthtouch food allergies, Paxil [paroxetine hcl], Prozac [fluoxetine hcl], Zoloft [sertraline hcl], and Ranexa [ranolazine]   Social History: The patient  reports that she has never smoked. She has  never used smokeless tobacco. She reports that she does not drink alcohol and does not use drugs.   Family History: The patient's family history includes Cancer in her father; Heart disease in her mother; Hypertension in her mother; Stroke in her father and mother.   ROS:  Please see the history of present illness. Otherwise, complete review of systems is positive for none.  All other systems are reviewed and negative.   Physical Exam: VS:  BP (!) 146/64   Pulse (!) 56   Ht 5' 3"  (1.6 m)   Wt 229 lb 9.6 oz (104.1 kg)   SpO2 96%   BMI 40.67 kg/m , BMI Body mass index is 40.67 kg/m.  Wt Readings from Last 3 Encounters:  09/28/20 229 lb 9.6 oz (104.1 kg)  09/22/20 230 lb (104.3 kg)  09/15/20 215 lb (97.5 kg)    General: Morbidly obese patient appears comfortable at rest. Neck: Supple, no elevated JVP or carotid bruits, no thyromegaly. Lungs: Clear to auscultation, nonlabored breathing at rest. Cardiac: Regular rate and rhythm, no S3 or significant systolic murmur, no pericardial rub. Extremities: No pitting edema, distal pulses 2+. Skin: Warm and dry.  Catheter access site clean dry. Musculoskeletal: No kyphosis. Neuropsychiatric: Alert and oriented x3, affect grossly appropriate.  ECG:    Recent Labwork: 11/12/2019: ALT 23; AST 22 08/20/2020: Magnesium 1.6 09/16/2020: BUN 23; Creatinine, Ser 1.22; Platelets 232 09/22/2020: Hemoglobin 10.5; Potassium 3.8; Sodium 143     Component Value Date/Time   CHOL 151 11/12/2019 1520   TRIG 208 (H) 11/12/2019 1520   HDL 45 11/12/2019 1520   CHOLHDL 3.4 11/12/2019 1520   LDLCALC 72 11/12/2019 1520    Other Studies Reviewed Today:  Cardiac catheterization 09/22/2020 Conclusion      Prox LAD lesion is 15% stenosed.   Prox LAD to Mid LAD Long stented segment is widely patent -several very small diagonal branches are somewhat jailed, but less than 1 mm diameter.   Prox Cx lesion is 15% stenosed.   Ost RCA lesion is 20% stenosed.   LV end  diastolic pressure is mildly elevated.   No evidence of Pulmonary Hypertension: PAP mean 21 mmHg with LVEDP and PCWP of 18 mmHg.   SUMMARY Stable single-vessel CAD with widely patent LAD stent.  LAD tapers  distally to a very small caliber vessel with a small diagonal branch.  Several tiny diagonal branches are jailed by the stent, but no options for PCI.   Stable from there is catheterization in January 2021. Otherwise normal large-caliber LCx and RCA. Mild to moderately elevated LVEDP and PCWP with normal Right Heart Cath Pressures-no evidence of Pulmonary Hypertension. Systemic Hypertension Cardiac Output and Index (Fick) 8.37-4.08.   PVR 0.6-1.22 Sherral Hammers   Consider microvascular disease, hypertensive heart disease as other potential etiologies from a cardiac standpoint for chest pain versus noncardiac chest pain.   Consider calcium channel blocker and beta-blocker titration as opposed to long-acting nitrate.     Coronary Diagrams   Diagnostic Dominance: Right      11/2017 cath [facility] Severe >70% long mid LAD stenosis No other significant angiographic stenosis in the other coronary arteries  DES to mid LAD   Malignant hypertension - selective renal angio without any evidence of  renal artery stenosis.   Renal angio procedure:  A Tiger 4 catheter was used to selectively engage both the left and right  renal arteries and was used to perform selective angiography.    Renal artery anatomy:  The left kidney is supplied by a single renal artery which has no  angiographic stenosis  The right kidney is supplied by a single rena artery which has no  angiographic stenosis     Jan 2021 Mild, non-obstructive coronary artery disease. Widely patent proximal/mid LAD stent. Normal left ventricular systolic function with upper normal to mildly elevated filling pressure (LVEDP ~15 mmHg).     Echocardiogram 07/07/2020  1. Left ventricular ejection fraction, by estimation, is  65 to 70%. The left ventricle has normal function. Left ventricular endocardial border not optimally defined to evaluate regional wall motion. There is mild left ventricular hypertrophy. Left ventricular diastolic parameters are consistent with Grade II diastolic dysfunction (pseudonormalization). Normal global longitudinal strain of -19.8%. 2. Right ventricular systolic function is normal. The right ventricular size is normal. There is normal pulmonary artery systolic pressure. The estimated right ventricular systolic pressure is 12.2 mmHg. 3. Left atrial size was mildly dilated. 4. Right atrial size was mildly dilated. 5. The mitral valve is grossly normal. Trivial mitral valve regurgitation. 6. The aortic valve has an indeterminant number of cusps. Aortic valve regurgitation is not visualized. 7. The inferior vena cava is normal in size with greater than 50% respiratory variability, suggesting right atrial pressure of 3 mmHg. Comparison(s): Echocardiogram done 11/20/17 showed an EF of 60-65%.   Assessment and Plan:  1. Chest pain of uncertain etiology   2. SOB (shortness of breath)   3. CAD in native artery   4. Essential hypertension   5. Mixed hyperlipidemia    1. Chest pain of uncertain etiology Having transient brief sharp chest pains occasionally not associated with exertion which occur randomly without radiation or associated symptoms.  Recent cardiac catheterization showed patent LAD stent with mild nonobstructive disease otherwise.  Continue Imdur 120 mg daily.  Continue sublingual nitroglycerin as needed.  Continue aspirin 81 mg daily.  Start amlodipine 5 mg daily to help with anginal-like symptoms and blood pressure.  Continue metoprolol 25 mg p.o. twice daily.  2. SOB (shortness of breath) Continues to complain of shortness of breath with more than usual ADLs.  This is more likely due to issues such as deconditioning, morbid obesity, G2 DD, LVH.  Elevated blood pressure.   Continue Lasix 20 mg p.o. daily.  Continue potassium supplementation.  Please  get a BMP and magnesium through Dr. Marjean Donna office to reassess renal function and electrolytes.  3. CAD in native artery Recent cardiac catheterization on 09/22/2020. Widely patent LAD stent, proximal LAD 15%, proximal LAD to mid LAD long stented segment widely patent very small diagonal branches somewhat jailed but less than 1 mm diameter.  Proximal circumflex lesion 15%, ostial RCA lesion 20%..  4. Essential hypertension Blood pressure elevated today at 146/64.  Start amlodipine 5 mg p.o. daily.  Continue lisinopril 40 mg p.o. daily.  Continue metoprolol 25 mg p.o. twice daily.  Goal blood pressure 130/80 or less.  5. Mixed hyperlipidemia Continue atorvastatin 40 mg daily.  Last lipid panel 11/12/2019 with TC of 151, TG 208, HDL 45, LDL 72.  Medication Adjustments/Labs and Tests Ordered: Current medicines are reviewed at length with the patient today.  Concerns regarding medicines are outlined above.   Disposition: Follow-up with Dr. Harl Bowie or APP 6 months  Signed, Levell July, NP 09/28/2020 12:04 PM    [facility], [place], [place] [address] Phone: [phone]; Fax: [phone]

## 2020-09-28 ENCOUNTER — Encounter: Payer: Self-pay | Admitting: Family Medicine

## 2020-09-28 ENCOUNTER — Ambulatory Visit: Payer: Medicare HMO | Admitting: Family Medicine

## 2020-09-28 ENCOUNTER — Other Ambulatory Visit: Payer: Self-pay

## 2020-09-28 VITALS — BP 146/64 | HR 56 | Ht 63.0 in | Wt 229.6 lb

## 2020-09-28 DIAGNOSIS — I2510 Atherosclerotic heart disease of native coronary artery without angina pectoris: Secondary | ICD-10-CM

## 2020-09-28 DIAGNOSIS — R0602 Shortness of breath: Secondary | ICD-10-CM

## 2020-09-28 DIAGNOSIS — I10 Essential (primary) hypertension: Secondary | ICD-10-CM | POA: Diagnosis not present

## 2020-09-28 DIAGNOSIS — R079 Chest pain, unspecified: Secondary | ICD-10-CM

## 2020-09-28 DIAGNOSIS — E782 Mixed hyperlipidemia: Secondary | ICD-10-CM

## 2020-09-28 MED ORDER — AMLODIPINE BESYLATE 5 MG PO TABS: 5.0000 mg | ORAL_TABLET | Freq: Every day | ORAL | 6 refills | Status: DC

## 2020-09-28 NOTE — Patient Instructions (Addendum)
Medication Instructions:  Start amlodipine 5 mg daily Continue other medications the same  Labwork: BMET & Mg in 2 weeks at your family doctor's office  Testing/Procedures: none  Follow-Up: Your physician recommends that you schedule a follow-up appointment in: 6 months  Any Other Special Instructions Will Be Listed Below (If Applicable).  If you need a refill on your cardiac medications before your next appointment, please call your pharmacy.

## 2020-09-30 ENCOUNTER — Encounter: Payer: Self-pay | Admitting: Family Medicine

## 2020-09-30 ENCOUNTER — Other Ambulatory Visit: Payer: Self-pay | Admitting: Family Medicine

## 2020-09-30 ENCOUNTER — Ambulatory Visit (INDEPENDENT_AMBULATORY_CARE_PROVIDER_SITE_OTHER): Payer: Medicare HMO | Admitting: Family Medicine

## 2020-09-30 ENCOUNTER — Other Ambulatory Visit: Payer: Self-pay

## 2020-09-30 VITALS — BP 141/79 | HR 58 | Temp 97.2°F | Ht 63.0 in | Wt 229.8 lb

## 2020-09-30 DIAGNOSIS — I152 Hypertension secondary to endocrine disorders: Secondary | ICD-10-CM

## 2020-09-30 DIAGNOSIS — R5381 Other malaise: Secondary | ICD-10-CM | POA: Diagnosis not present

## 2020-09-30 DIAGNOSIS — Z955 Presence of coronary angioplasty implant and graft: Secondary | ICD-10-CM

## 2020-09-30 DIAGNOSIS — E1169 Type 2 diabetes mellitus with other specified complication: Secondary | ICD-10-CM

## 2020-09-30 DIAGNOSIS — Z794 Long term (current) use of insulin: Secondary | ICD-10-CM

## 2020-09-30 DIAGNOSIS — R0600 Dyspnea, unspecified: Secondary | ICD-10-CM | POA: Diagnosis not present

## 2020-09-30 DIAGNOSIS — E1159 Type 2 diabetes mellitus with other circulatory complications: Secondary | ICD-10-CM | POA: Diagnosis not present

## 2020-09-30 DIAGNOSIS — I25119 Atherosclerotic heart disease of native coronary artery with unspecified angina pectoris: Secondary | ICD-10-CM

## 2020-09-30 DIAGNOSIS — F418 Other specified anxiety disorders: Secondary | ICD-10-CM

## 2020-09-30 DIAGNOSIS — R0609 Other forms of dyspnea: Secondary | ICD-10-CM

## 2020-09-30 DIAGNOSIS — I10 Essential (primary) hypertension: Secondary | ICD-10-CM | POA: Diagnosis not present

## 2020-09-30 LAB — BAYER DCA HB A1C WAIVED: HB A1C (BAYER DCA - WAIVED): 6.7 % (ref <7.0)

## 2020-09-30 MED ORDER — ATORVASTATIN CALCIUM 40 MG PO TABS: 40.0000 mg | ORAL_TABLET | Freq: Every day | ORAL | 3 refills | Status: DC

## 2020-09-30 MED ORDER — MAGNESIUM OXIDE 400 MG PO TABS: ORAL_TABLET | ORAL | 3 refills | Status: DC

## 2020-09-30 MED ORDER — LORAZEPAM 0.5 MG PO TABS: ORAL_TABLET | ORAL | 0 refills | Status: DC

## 2020-09-30 MED ORDER — FLUTICASONE PROPIONATE 50 MCG/ACT NA SUSP: 2.0000 | Freq: Every day | NASAL | 3 refills | Status: DC

## 2020-09-30 NOTE — Patient Instructions (Signed)
Deconditioning Deconditioning refers to the changes in the body that occur during a period of inactivity. The changes happen in the heart, lungs, and muscles. They make you feel tired and weak (fatigued) and decrease your ability to be active. The three stages of deconditioning include: Mild deconditioning. This is a change in your ability to do your usual exercise activities, such as running, biking, or swimming. Moderate deconditioning. This is a change in your ability to do normal everyday activities, such as walking, shopping for groceries, and doing chores. Severe deconditioning. In this stage, you may not be able to do minimal activity or usual self-care. What are the causes? Deconditioning can occur after only a few days of inactivity. The longer the period of inactivity, the more severe the deconditioning will be, and the longer it will take to return to your previous level of functioning. Deconditioning is often caused by inactivity due to: Illnesses, such as cancer, stroke, heart attack, fibromyalgia, and chronic fatigue syndrome. Injuries, especially back injuries, broken bones, and injuries to soft tissues, such as ligaments and tendons. A long stay in the hospital. Pregnancy, especially if long periods of bed rest are needed. What increases the risk? The following factors may make you more likely to develop this condition: Staying in the hospital or being on bed rest. Obesity. Poor nutrition. Being an older adult. Having an injury or illness that affects your movement and activity. What are the signs or symptoms? Symptoms of this condition include: Weakness and tiredness. Shortness of breath with minor physical effort (exertion). A heartbeat that is faster than normal. You may not notice this without taking your pulse. Pain or discomfort with activity. Decreased strength, endurance, and balance. Difficulty doing your usual forms of exercise. Difficulty doing activities of daily  living, such as grocery shopping or chores. You may also have problems walking around the house and doing basic self-care, such as getting to the bathroom, preparing meals, or doing laundry. How is this diagnosed? This condition is diagnosed based on your medical history and a physical exam. During the physical exam, your health care provider will check for signs of deconditioning, such as: Decreased size of muscles. Decreased strength. Trouble with balance. Shortness of breath or a heart rate that is faster than normal after minor exertion. How is this treated? Treatment for this condition involves an exercise program in which activity is increased slowly. Your health care provider will tell you which exercises are right for you. The exercise program will likely include: Aerobic exercise. This type of exercise helps improve the functioning of the heart, lungs, and muscles. Strength training. This type of exercise helps increase muscle size and strength. Both of these types of exercise will improve your endurance. You may be referred to a physical therapist who can create a safe strengthening programfor you to follow. Follow these instructions at home: Eating and drinking  Eat a healthy, well-balanced diet. This includes: Proteins, such as lean meats and fish, to build muscles. Fresh fruits and vegetables. Carbohydrates, such as whole grains, to boost energy. Drink enough fluid to keep your urine pale yellow.  Activity  Follow the exercise program that is recommended by your health care provider or physical therapist. Do not increase your exercise any faster than directed.  General instructions Take over-the-counter and prescription medicines only as told by your health care provider. Do not use any products that contain nicotine or tobacco, such as cigarettes, e-cigarettes, and chewing tobacco. If you need help quitting, ask your health  care provider. Keep all follow-up visits as told  by your health care provider. This is important. Contact a health care provider if: You are not able to do the recommended exercise program. You are becoming more and more tired and weak. You become light-headed when rising to a sitting or standing position. Your level of endurance decreases after it has improved. Get help right away if you: Have chest pain. Are very short of breath. Have any episodes of fainting. Summary Deconditioning refers to the changes in the body that occur during a period of inactivity. Deconditioning happens in the heart, lungs, and muscles. The changes make you feel tired and weak and decrease your ability to be active. Treatment for deconditioning involves an exercise program in which activity is increased slowly. This information is not intended to replace advice given to you by your health care provider. Make sure you discuss any questions you have with your healthcare provider. Document Revised: 07/05/2018 Document Reviewed: 07/05/2018 Elsevier Patient Education  [place].

## 2020-09-30 NOTE — Progress Notes (Signed)
Subjective: CC: DM PCP: Janora Norlander, DO LTR:VUYEB Amber Reeves is a 70 y.o. female presenting to clinic today for:  1. Type 2 Diabetes with hypertension, hyperlipidemia:  Patient recently worked up by cardiology for transient sharp chest pains.  Her cardiac cath showed patent LAD stent.  She was continued on Imdur 120 mg daily and baby aspirin with as needed sublingual nitroglycerin.  Norvasc 5 mg daily was started for anginal symptoms and she was continued on metoprolol 25 mg twice daily.  Her chronic shortness of breath was felt to be secondary to physical deconditioning in the setting of morbid obesity and multiple comorbidities.  They have asked for BMP and magnesium level to be obtained with labs today.  She reports ongoing dyspnea on exertion and occasionally still having some chest pain on exertion.   Last eye exam: UTD Last foot exam: Needs Last A1c:  Lab Results  Component Value Date   HGBA1C 6.7 06/10/2020   Nephropathy screen indicated?: UTD Last flu, zoster and/or pneumovax:  Immunization History  Administered Date(s) Administered   Influenza, High Dose Seasonal PF 02/16/2017   Influenza,inj,Quad PF,6+ Mos 12/18/2013   Moderna Sars-Covid-2 Vaccination 05/01/2019, 05/29/2019, 02/18/2020    2.  Anxiety Patient reports some situational anxiety surrounding her health.  She has a breast biopsy coming up soon and she is very worried about this.  She had to undergo these issues in the past.   ROS: Per HPI  Allergies  Allergen Reactions   Aspartame Rash   Penicillins Itching and Rash    Did it involve swelling of the face/tongue/throat, SOB, or low BP? No Did it involve sudden or severe rash/hives, skin peeling, or any reaction on the inside of your mouth or nose? No Did you need to seek medical attention at a hospital or doctor's office? No When did it last happen?~25 years ago       If all above answers are "NO", may proceed with cephalosporin use.    Farxiga  [Dapagliflozin]     UTIs   Metformin And Related     GI distress, dizziness   No [facility] Food Allergies     Honey-Rash Artificial Sweeteners-rash   Paxil [Paroxetine Hcl] Other (See Comments)    Twitching/feels like skin crawling   Prozac [Fluoxetine Hcl] Other (See Comments)    Twitching/feels like skin crawling   Zoloft [Sertraline Hcl] Other (See Comments)    Twitching/feels like skin crawling   Ranexa [Ranolazine] Palpitations    Heart racing   Past Medical History:  Diagnosis Date   Allergy    Anxiety    Arthritis    Asthma    Depression    Diabetes mellitus without complication (HCC)    GERD (gastroesophageal reflux disease)    Hyperlipidemia    Hypertension     Current Outpatient Medications:    albuterol (VENTOLIN HFA) 108 (90 Base) MCG/ACT inhaler, TAKE 2 PUFFS BY MOUTH EVERY 4 HOURS AS NEEDED FOR WHEEZE, Disp: 18 g, Rfl: 3   Alcohol Swabs (B-D SINGLE USE SWABS REGULAR) PADS, Apply topically., Disp: , Rfl:    amLODipine (NORVASC) 5 MG tablet, Take 1 tablet (5 mg total) by mouth daily., Disp: 30 tablet, Rfl: 6   aspirin EC 81 MG tablet, Take 81 mg by mouth in the morning., Disp: , Rfl:    atorvastatin (LIPITOR) 40 MG tablet, Take 1 tablet (40 mg total) by mouth daily at 6 PM. (Patient taking differently: Take 40 mg by mouth at bedtime.),  Disp: 90 tablet, Rfl: 3   Blood Glucose Calibration (ACCU-CHEK AVIVA) SOLN, 2 Bottles by Other route as needed., Disp: , Rfl:    budesonide-formoterol (SYMBICORT) 160-4.5 MCG/ACT inhaler, Inhale 2 puffs into the lungs 2 (two) times daily., Disp: 4 each, Rfl: 11   Calcium Carb-Cholecalciferol (CALCIUM 600+D3 PO), Take 1 tablet by mouth in the morning., Disp: , Rfl:    diphenhydrAMINE (BENADRYL) 25 MG tablet, Take 50 mg by mouth at bedtime., Disp: , Rfl:    docusate sodium (COLACE) 100 MG capsule, Take 100 mg by mouth 2 (two) times daily as needed (constipation.). , Disp: , Rfl:    fluticasone (FLONASE) 50 MCG/ACT nasal spray, Place  2 sprays into both nostrils daily. (Patient taking differently: Place 2 sprays into both nostrils in the morning.), Disp: 48 g, Rfl: 3   furosemide (LASIX) 20 MG tablet, Take 20 mg by mouth in the morning., Disp: , Rfl:    glucose blood test strip, TEST BLOOD SUGAR TWICE DAILY, Disp: , Rfl:    ibuprofen (ADVIL) 200 MG tablet, Take 400 mg by mouth daily as needed (pain (leg pain))., Disp: , Rfl:    insulin degludec (TRESIBA FLEXTOUCH) 200 UNIT/ML FlexTouch Pen, Inject 40 Units into the skin daily. (Patient taking differently: Inject 40 Units into the skin in the morning.), Disp: 18 mL, Rfl: 0   Insulin Syringe-Needle U-100 31G X 5/16" 0.5 ML MISC, Use to inject Insulin four times daily: Use as directed; Dx: E11.9, E66.9, Disp: , Rfl:    ipratropium-albuterol (DUONEB) 0.5-2.5 (3) MG/3ML SOLN, TAKE 3 ML (1 VIAL) BY NEBULIZATION EVERY 6 HOURS AS NEEDED FOR WHEEZING. (Patient taking differently: Inhale 3 mLs into the lungs every 6 (six) hours as needed (wheezing).), Disp: 360 mL, Rfl: 0   isosorbide mononitrate (IMDUR) 60 MG 24 hr tablet, Take 2 tablets (120 mg total) by mouth daily. (Patient taking differently: Take 60 mg by mouth in the morning and at bedtime.), Disp: 180 tablet, Rfl: 3   lansoprazole (PREVACID) 15 MG capsule, Take 30 mg by mouth at bedtime., Disp: , Rfl:    lisinopril (ZESTRIL) 40 MG tablet, Take 1 tablet (40 mg total) by mouth daily. (Patient taking differently: Take 40 mg by mouth in the morning.), Disp: 90 tablet, Rfl: 3   magnesium oxide (MAG-OX) 400 MG tablet, Take magnesium on days you take lasix (Patient taking differently: Take 400 mg by mouth in the morning.), Disp: 60 tablet, Rfl: 3   metoprolol tartrate (LOPRESSOR) 25 MG tablet, Take 1 tablet (25 mg total) by mouth 2 (two) times daily., Disp: 180 tablet, Rfl: 3   montelukast (SINGULAIR) 10 MG tablet, Take 1 tablet (10 mg total) by mouth at bedtime., Disp: 90 tablet, Rfl: 3   nitroGLYCERIN (NITROSTAT) 0.4 MG SL tablet, Place 1  tablet (0.4 mg total) under the tongue every 5 (five) minutes as needed for chest pain. (Patient taking differently: Place 0.4 mg under the tongue every 5 (five) minutes x 3 doses as needed for chest pain.), Disp: 25 tablet, Rfl: 3   pioglitazone (ACTOS) 30 MG tablet, Take 1 tablet (30 mg total) by mouth daily. (Patient taking differently: Take 30 mg by mouth in the morning.), Disp: 90 tablet, Rfl: 3   potassium chloride (KLOR-CON M10) 10 MEQ tablet, TAKE 1 TABLET (10 MEQ TOTAL) BY MOUTH EVERY EVENING. (Patient taking differently: Take 10 mEq by mouth every evening. Marland Kitchen), Disp: 90 tablet, Rfl: 3  Current Facility-Administered Medications:    sodium chloride flush (NS) 0.9 %  injection 3 mL, 3 mL, Intravenous, Q12H, Branch, Alphonse Guild, MD Social History   Socioeconomic History   Marital status: Married    Spouse name: Not on file   Number of children: Not on file   Years of education: Not on file   Highest education level: Not on file  Occupational History   Not on file  Tobacco Use   Smoking status: Never   Smokeless tobacco: Never  Vaping Use   Vaping Use: Never used  Substance and Sexual Activity   Alcohol use: Never   Drug use: Never   Sexual activity: Not Currently  Other Topics Concern   Not on file  Social History Narrative   Resides at home with her husband.  She is originally from [place] but relocated to [place] and then to [place] in August 2020.  She has a son who lives about 2 hours away in [place] and a sister-in-law who lives in [place].   Social Determinants of Health   Financial Resource Strain: Low Risk    Difficulty of Paying Living Expenses: Not hard at all  Food Insecurity: No Food Insecurity   Worried About Charity fundraiser in the Last Year: Never true   [place] in the Last Year: Never true  Transportation Needs: No Transportation Needs   Lack of Transportation (Medical): No   Lack of Transportation (Non-Medical): No  Physical Activity:  Inactive   Days of Exercise per Week: 0 days   Minutes of Exercise per Session: 0 min  Stress: No Stress Concern Present   Feeling of Stress : Not at all  Social Connections: Moderately Isolated   Frequency of Communication with Friends and Family: More than three times a week   Frequency of Social Gatherings with Friends and Family: More than three times a week   Attends Religious Services: Never   Marine scientist or Organizations: No   Attends Music therapist: Never   Marital Status: Married  Human resources officer Violence: Not At Risk   Fear of Current or Ex-Partner: No   Emotionally Abused: No   Physically Abused: No   Sexually Abused: No   Family History  Problem Relation Age of Onset   Heart disease Mother    Stroke Mother    Hypertension Mother    Cancer Father    Stroke Father     Objective: Office vital signs reviewed. BP (!) 141/79   Pulse (!) 58   Temp (!) 97.2 F (36.2 C)   Ht 5' 3"  (1.6 m)   Wt 229 lb 12.8 oz (104.2 kg)   SpO2 98%   BMI 40.71 kg/m   Physical Examination:  General: Awake, alert, obese, No acute distress HEENT: Normal; sclera white Cardio: Slightly bradycardic with regular rhythm, S1S2 heard, no murmurs appreciated Pulm: clear to auscultation bilaterally, no wheezes, rhonchi or rales; normal work of breathing on room air Neuro: see DM foot  Diabetic Foot Exam - Simple   Simple Foot Form Diabetic Foot exam was performed with the following findings: Yes 09/30/2020  5:10 PM  Visual Inspection No deformities, no ulcerations, no other skin breakdown bilaterally: Yes Sensation Testing Intact to touch and monofilament testing bilaterally: Yes Pulse Check Posterior Tibialis and Dorsalis pulse intact bilaterally: Yes Comments She has moderate nonpitting edema of the ankles with associated varicose veins    Depression screen [facility] 2/9 09/30/2020 07/16/2020 06/10/2020  Decreased Interest 0 0 0  Down, Depressed, Hopeless 0 0 0  PHQ - 2 Score 0 0 0  Altered sleeping 2 - -  Tired, decreased energy 3 - -  Change in appetite 0 - -  Feeling bad or failure about yourself  0 - -  Trouble concentrating 0 - -  Moving slowly or fidgety/restless 0 - -  Suicidal thoughts 0 - -  PHQ-9 Score 5 - -  Difficult doing work/chores Not difficult at all - -   GAD 7 : Generalized Anxiety Score 09/30/2020  Nervous, Anxious, on Edge 2  Control/stop worrying 3  Worry too much - different things 0  Trouble relaxing 0  Restless 0  Easily annoyed or irritable 0  Afraid - awful might happen 0  Total GAD 7 Score 5  Anxiety Difficulty Not difficult at all     Assessment/ Plan: 70 y.o. female   Controlled type 2 diabetes mellitus with other specified complication, with long-term current use of insulin (HCC) - Plan: Bayer DCA Hb A1c Waived  Coronary artery disease involving native coronary artery of native heart with angina pectoris (HCC) - Plan: atorvastatin (LIPITOR) 40 MG tablet, CANCELED: Basic Metabolic Panel, CANCELED: Magnesium  History of coronary artery stent placement  Hypertension associated with diabetes ([place])  Dyspnea on exertion  Physical deconditioning  Situational anxiety - Plan: LORazepam (ATIVAN) 0.5 MG tablet  Sugar remains under excellent control.  No changes needed  BMP and magnesium were ordered by her cardiologist who these orders were canceled for her.  I renewed her Lipitor  Unfortunately continues to have chest pain and dyspnea on exertion.  I do agree with cardiology that this is likely secondary to physical deconditioning.  I reviewed the results of her catheterization as well as her cardiology note which showed no etiology of her ongoing symptoms.  She does have an upcoming breast biopsy and I have given her Ativan to have on hand for this.  We discussed indication for use and how to repeat if needed.  She will have her spouse driving her to and from these appointments.  The national narcotic  database was reviewed and there were no red flags.  Additionally, I did offer her some counseling services but she declined this today.  Orders Placed This Encounter  Procedures   Bayer DCA Hb A1c Waived   No orders of the defined types were placed in this encounter.    Janora Norlander, DO [place] [phone]

## 2020-10-01 ENCOUNTER — Other Ambulatory Visit: Payer: Self-pay | Admitting: Family Medicine

## 2020-10-01 ENCOUNTER — Ambulatory Visit
Admission: RE | Admit: 2020-10-01 | Discharge: 2020-10-01 | Disposition: A | Discharge status: Home / Self Care | Payer: Medicare HMO | Source: Ambulatory Visit | Attending: Family Medicine | Admitting: Family Medicine

## 2020-10-01 DIAGNOSIS — R921 Mammographic calcification found on diagnostic imaging of breast: Secondary | ICD-10-CM

## 2020-10-01 DIAGNOSIS — N6489 Other specified disorders of breast: Secondary | ICD-10-CM | POA: Diagnosis not present

## 2020-10-01 LAB — BASIC METABOLIC PANEL
BUN/Creatinine Ratio: 13 (ref 12–28)
BUN: 13 mg/dL (ref 8–27)
CO2: 23 mmol/L (ref 20–29)
Calcium: 8.9 mg/dL (ref 8.7–10.3)
Chloride: 102 mmol/L (ref 96–106)
Creatinine, Ser: 1 mg/dL (ref 0.57–1.00)
Glucose: 157 mg/dL — ABNORMAL HIGH (ref 65–99)
Potassium: 3.9 mmol/L (ref 3.5–5.2)
Sodium: 141 mmol/L (ref 134–144)
eGFR: 61 mL/min/{1.73_m2} (ref >59)

## 2020-10-01 LAB — MAGNESIUM: Magnesium: 1.7 mg/dL (ref 1.6–2.3)

## 2020-10-06 ENCOUNTER — Telehealth: Payer: Self-pay | Admitting: *Deleted

## 2020-10-06 MED ORDER — FUROSEMIDE 20 MG PO TABS: 20.0000 mg | ORAL_TABLET | ORAL | Status: DC | PRN

## 2020-10-06 NOTE — Telephone Encounter (Signed)
Laurine Blazer, LPN  3/58/4465 20:76 AM EDT Back to Top    Patient notified and verbalized understanding.    Laurine Blazer, LPN  1/91/5502  7:14 PM EDT     Notified, copy to pcp.  She questions if her Lasix dose is to remain at 34m daily.  Creatinine elevated a smidge at 1.22 on 09/16/2020 and back down to 1.00 on this lab.  Explained to her that labs are stable now, but still wanted clarification.    ALaurine Blazer LPN  82/32/0094 61:79PM EDT     Notified, copy to pcp.    AVerta Ellen, NP  10/01/2020  7:53 AM EDT     Labs look good except for glucose elevated at 157. Thanks  AVerta Ellen NP  10/01/2020 7:52 AM

## 2020-10-19 ENCOUNTER — Other Ambulatory Visit: Payer: Self-pay | Admitting: Family Medicine

## 2020-10-19 DIAGNOSIS — I152 Hypertension secondary to endocrine disorders: Secondary | ICD-10-CM

## 2020-10-27 ENCOUNTER — Ambulatory Visit: Payer: Medicare HMO | Admitting: Family Medicine

## 2020-11-12 NOTE — Telephone Encounter (Signed)
Scheduled telephone f/u

## 2020-11-20 ENCOUNTER — Other Ambulatory Visit: Payer: Self-pay | Admitting: Family Medicine

## 2020-12-14 ENCOUNTER — Telehealth: Payer: Self-pay | Admitting: *Deleted

## 2020-12-14 NOTE — Telephone Encounter (Signed)
2 boxes of pen needles came for pt - aware up front with name on them - pt assistance.

## 2020-12-22 ENCOUNTER — Ambulatory Visit: Payer: Medicare HMO | Admitting: Pharmacist

## 2020-12-31 ENCOUNTER — Telehealth: Payer: Self-pay | Admitting: Family Medicine

## 2021-01-02 ENCOUNTER — Other Ambulatory Visit: Payer: Self-pay | Admitting: Family Medicine

## 2021-01-02 DIAGNOSIS — E1165 Type 2 diabetes mellitus with hyperglycemia: Secondary | ICD-10-CM

## 2021-01-07 ENCOUNTER — Ambulatory Visit (INDEPENDENT_AMBULATORY_CARE_PROVIDER_SITE_OTHER): Payer: Medicare HMO | Admitting: Pharmacist

## 2021-01-07 DIAGNOSIS — E782 Mixed hyperlipidemia: Secondary | ICD-10-CM

## 2021-01-07 DIAGNOSIS — J4540 Moderate persistent asthma, uncomplicated: Secondary | ICD-10-CM

## 2021-01-07 DIAGNOSIS — E1159 Type 2 diabetes mellitus with other circulatory complications: Secondary | ICD-10-CM

## 2021-01-07 NOTE — Telephone Encounter (Signed)
WILL RESCHEDULE

## 2021-01-07 NOTE — Progress Notes (Signed)
  Care Management   Follow Up Note   12/22/2020 Name: Amber Reeves MRN: [id number] DOB: October 17, 1950   Referred by: Janora Norlander, DO Reason for referral : Chronic Care Management   An unsuccessful telephone outreach was attempted today. The patient was referred to the case management team for assistance with care management and care coordination.   Follow Up Plan: Telephone follow up appointment with care management team member scheduled for:3  WEEKS  SIGNATURE Regina Eck, PharmD, BCPS Clinical Pharmacist, [place]  II Phone [phone]

## 2021-01-08 ENCOUNTER — Telehealth: Payer: Self-pay | Admitting: Family Medicine

## 2021-01-08 ENCOUNTER — Ambulatory Visit: Payer: Medicare HMO | Admitting: Pharmacist

## 2021-01-08 DIAGNOSIS — J4540 Moderate persistent asthma, uncomplicated: Secondary | ICD-10-CM

## 2021-01-08 DIAGNOSIS — E1159 Type 2 diabetes mellitus with other circulatory complications: Secondary | ICD-10-CM

## 2021-01-08 MED ORDER — IPRATROPIUM-ALBUTEROL 0.5-2.5 (3) MG/3ML IN SOLN: RESPIRATORY_TRACT | 0 refills | Status: DC

## 2021-01-08 MED ORDER — MONTELUKAST SODIUM 10 MG PO TABS: 10.0000 mg | ORAL_TABLET | Freq: Every day | ORAL | 3 refills | Status: DC

## 2021-01-08 NOTE — Telephone Encounter (Signed)
CALL RETURNED  SEE NOTE FROM VISIT TODAY

## 2021-01-08 NOTE — Progress Notes (Signed)
Chronic Care Management Pharmacy Note  01/08/2021 Name:  Amber Reeves MRN:  [id number] DOB:  1950-04-01  Summary: T2DM  Recommendations/Changes made from today's visit:  Diabetes: Goal on Track (progressing): YES. Controlled-A1C 6.7%; current treatment: TRESIBA, PIOGLITAZONE;  Would like to optimize regimen to provide CV, kidney benefit, however patient intolerant list makes this challenging Considering GLP1 therapy to d/c pioglitazone CONTINUE TRESIBA-->SAMPLES PROVIDED TO BRIDGE TO PATIENT ASSISTANCE SUPPLY Current glucose readings: fasting glucose: <130, post prandial glucose: <180 Denies hypoglycemic/hyperglycemic symptoms Discussed meal planning options and Plate method for healthy eating Avoid sugary drinks and desserts Incorporate balanced protein, non starchy veggies, 1 serving of carbohydrate with each meal Increase water intake Increase physical activity as able Current exercise: encouraged Educated on diet, medications Assessed patient finances. Will enroll in novo nordisk PAP (TRESIBA) VS LILLY CARES PAP (BASAGLAR) FOR INSULIN--WILL NEED PATIENT FINANCIALS FOR NOVO NORDISK  ASTHMA -ON SYMBICORT--enrolled in az&[place] patient assistance program; APPLICATION SUBMITTED FOR 2023 -Uses CPAP at night -Controlled, continue current management  Patient Goals/Self-Care Activities patient will:  - take medications as prescribed as evidenced by patient report and record review check glucose DAILY/FASTING, document, and provide at future appointments collaborate with provider on medication access solutions target a minimum of 150 minutes of moderate intensity exercise weekly engage in dietary modifications by FOLLOWING HEART HEALTHY DIET  Plan: 01/2021   Subjective: Amber Reeves is an 70 y.o. year old female who is a primary patient of Janora Norlander, DO.  The CCM team was consulted for assistance with disease management and care coordination needs.     Engaged with patient by telephone for follow up visit in response to provider referral for pharmacy case management and/or care coordination services.   Consent to Services:  The patient was given information about Chronic Care Management services, agreed to services, and gave verbal consent prior to initiation of services.  Please see initial visit note for detailed documentation.   Patient Care Team: Janora Norlander, DO as PCP - General (Family Medicine) Harl Bowie Alphonse Guild, MD as PCP - Cardiology (Cardiology) Cassandria Anger, MD as Consulting Physician (Endocrinology) Lavera Guise, [facility] as Pharmacist (Family Medicine)  Objective:  Lab Results  Component Value Date   CREATININE 1.00 09/30/2020   CREATININE 1.22 (H) 09/16/2020   CREATININE 0.97 08/20/2020    Lab Results  Component Value Date   HGBA1C 6.7 09/30/2020   Last diabetic Eye exam:  Lab Results  Component Value Date/Time   HMDIABEYEEXA No Retinopathy 08/25/2020 12:00 AM    Last diabetic Foot exam: No results found for: HMDIABFOOTEX      Component Value Date/Time   CHOL 151 11/12/2019 1520   TRIG 208 (H) 11/12/2019 1520   HDL 45 11/12/2019 1520   CHOLHDL 3.4 11/12/2019 1520   LDLCALC 72 11/12/2019 1520    Hepatic Function Latest Ref Rng & Units 11/12/2019 01/29/2019  Total Protein 6.0 - 8.5 g/dL 6.9 7.2  Albumin 3.8 - 4.8 g/dL 3.8 4.0  AST 0 - 40 IU/L 22 17  ALT 0 - 32 IU/L 23 16  Alk Phosphatase 44 - 121 IU/L 59 67  Total Bilirubin 0.0 - 1.2 mg/dL 0.4 0.6    No results found for: TSH, FREET4  CBC Latest Ref Rng & Units 09/22/2020 09/22/2020 09/22/2020  WBC 4.0 - 10.5 K/uL - - -  Hemoglobin 12.0 - 15.0 g/dL 10.5(L) 10.5(L) 10.5(L)  Hematocrit 36.0 - 46.0 % 31.0(L) 31.0(L) 31.0(L)  Platelets 150 - 400  K/uL - - -    No results found for: VD25OH  Clinical ASCVD: No  The 10-year ASCVD risk score (Arnett DK, et al., 2019) is: 26.1%   Values used to calculate the score:     Age: 70 years      Sex: Female     Is Non-Hispanic African American: No     Diabetic: Yes     Tobacco smoker: No     Systolic Blood Pressure: 378 mmHg     Is BP treated: Yes     HDL Cholesterol: 45 mg/dL     Total Cholesterol: 151 mg/dL    Other: (CHADS2VASc if Afib, PHQ9 if depression, MMRC or CAT for COPD, ACT, DEXA)  Social History   Tobacco Use  Smoking Status Never  Smokeless Tobacco Never   BP Readings from Last 3 Encounters:  09/30/20 (!) 141/79  09/28/20 (!) 146/64  09/22/20 136/60   Pulse Readings from Last 3 Encounters:  09/30/20 (!) 58  09/28/20 (!) 56  09/22/20 (!) 58   Wt Readings from Last 3 Encounters:  09/30/20 229 lb 12.8 oz (104.2 kg)  09/28/20 229 lb 9.6 oz (104.1 kg)  09/22/20 230 lb (104.3 kg)    Assessment: Review of patient past medical history, allergies, medications, health status, including review of consultants reports, laboratory and other test data, was performed as part of comprehensive evaluation and provision of chronic care management services.   SDOH:  (Social Determinants of Health) assessments and interventions performed:    CCM Care Plan  Allergies  Allergen Reactions   Aspartame Rash   Penicillins Itching and Rash    Did it involve swelling of the face/tongue/throat, SOB, or low BP? No Did it involve sudden or severe rash/hives, skin peeling, or any reaction on the inside of your mouth or nose? No Did you need to seek medical attention at a hospital or doctor's office? No When did it last happen?~25 years ago       If all above answers are "NO", may proceed with cephalosporin use.    Wilder Glade [Dapagliflozin]     UTIs   Metformin And Related     GI distress, dizziness   No [facility] Food Allergies     Honey-Rash Artificial Sweeteners-rash   Paxil [Paroxetine Hcl] Other (See Comments)    Twitching/feels like skin crawling   Prozac [Fluoxetine Hcl] Other (See Comments)    Twitching/feels like skin crawling   Zoloft [Sertraline Hcl] Other  (See Comments)    Twitching/feels like skin crawling   Ranexa [Ranolazine] Palpitations    Heart racing    Medications Reviewed Today     Reviewed by Lavera Guise, [facility] (Pharmacist) on 01/13/21 at 1340  Med List Status: <None>   Medication Order Taking? Sig Documenting Provider Last Dose Status Informant  albuterol (VENTOLIN HFA) 108 (90 Base) MCG/ACT inhaler [id number] No TAKE 2 PUFFS BY MOUTH EVERY 4 HOURS AS NEEDED FOR WHEEZE Ronnie Doss M, DO Taking Active Self  Alcohol Swabs (B-D SINGLE USE SWABS REGULAR) PADS [id number] No Apply topically. [provider] Taking Active Self  amLODipine (NORVASC) 5 MG tablet [id number] No Take 1 tablet (5 mg total) by mouth daily. Verta Ellen., NP Taking Active   aspirin EC 81 MG tablet [id number] No Take 81 mg by mouth in the morning. [provider] Taking Active Self  atorvastatin (LIPITOR) 40 MG tablet [id number]  Take 1 tablet (40 mg total) by mouth at bedtime. Ronnie Doss M, DO  Active   Blood Glucose Calibration (ACCU-CHEK AVIVA) SOLN [id number] No 2 Bottles by Other route as needed. [provider] Taking Active Self  budesonide-formoterol (SYMBICORT) 160-4.5 MCG/ACT inhaler [id number] No Inhale 2 puffs into the lungs 2 (two) times daily. Ronnie Doss M, DO Taking Active Self  Calcium Carb-Cholecalciferol (CALCIUM 600+D3 PO) [id number] No Take 1 tablet by mouth in the morning. [provider] Taking Active Self  diphenhydrAMINE (BENADRYL) 25 MG tablet [id number] No Take 50 mg by mouth at bedtime. [provider] Taking Active Self  docusate sodium (COLACE) 100 MG capsule [id number] No Take 100 mg by mouth 2 (two) times daily as needed (constipation.).  [provider] Taking Active Self  fluticasone (FLONASE) 50 MCG/ACT nasal spray [id number]  Place 2 sprays into both nostrils daily. Ronnie Doss M, DO  Active   furosemide (LASIX) 20 MG tablet [id number]  Take 1 tablet (20 mg  total) by mouth as needed (based on weight - may take one extra tab for weight gain of 3lb in 24 hrs or 5lb in 1 week). Verta Ellen., NP  Active   glucose blood test strip [id number] No TEST BLOOD SUGAR TWICE DAILY [provider] Taking Active Self  ibuprofen (ADVIL) 200 MG tablet [id number] No Take 400 mg by mouth daily as needed (pain (leg pain)). [provider] Taking Active Self  insulin degludec (TRESIBA FLEXTOUCH) 200 UNIT/ML FlexTouch Pen [id number] No Inject 40 Units into the skin daily.  Patient taking differently: Inject 40 Units into the skin in the morning.   Ronnie Doss M, DO Taking Active   Insulin Syringe-Needle U-100 31G X 5/16" 0.5 ML MISC [id number] No Use to inject Insulin four times daily: Use as directed; Dx: E11.9, E66.9 [provider] Taking Active Self  ipratropium-albuterol (DUONEB) 0.5-2.5 (3) MG/3ML SOLN [id number]  TAKE 3 ML (1 VIAL) BY NEBULIZATION EVERY 6 HOURS AS NEEDED FOR WHEEZING. Ronnie Doss M, DO  Active   isosorbide mononitrate (IMDUR) 60 MG 24 hr tablet [id number] No Take 2 tablets (120 mg total) by mouth daily.  Patient taking differently: Take 60 mg by mouth in the morning and at bedtime.   Arnoldo Lenis, MD Taking Active Self  lansoprazole (PREVACID) 15 MG capsule [id number] No Take 30 mg by mouth at bedtime. [provider] Taking Active Self  lisinopril (ZESTRIL) 40 MG tablet [id number]  TAKE 1 TABLET EVERY DAY Ronnie Doss M, DO  Active   LORazepam (ATIVAN) 0.5 MG tablet [id number]  Take 1 tablet 30 minutes before procedure.  May repeat 1 time. Ronnie Doss M, DO  Active   magnesium oxide (MAG-OX) 400 MG tablet [id number]  Take magnesium on days you take lasix Ronnie Doss M, DO  Active   metoprolol tartrate (LOPRESSOR) 25 MG tablet [id number] No Take 1 tablet (25 mg total) by mouth 2 (two) times daily. Ronnie Doss M, DO Taking Active Self  montelukast (SINGULAIR) 10 MG tablet  [id number]  Take 1 tablet (10 mg total) by mouth at bedtime. Ronnie Doss M, DO  Active   nitroGLYCERIN (NITROSTAT) 0.4 MG SL tablet [id number] No Place 1 tablet (0.4 mg total) under the tongue every 5 (five) minutes as needed for chest pain.  Patient taking differently: Place 0.4 mg under the tongue every 5 (five) minutes x 3 doses as needed for chest pain.   Verta Ellen., NP Taking Active Self  pioglitazone (ACTOS) 30 MG tablet [id number]  TAKE 1 TABLET BY MOUTH EVERY DAY Lajuana Ripple,  Ashly M, DO  Active   potassium chloride (KLOR-CON M10) 10 MEQ tablet [id number]  TAKE 1 TABLET (10 MEQ TOTAL) BY MOUTH EVERY EVENING. Ronnie Doss M, DO  Active   sodium chloride flush (NS) 0.9 % injection 3 mL [id number]   Arnoldo Lenis, MD  Active             Patient Active Problem List   Diagnosis Date Noted   Angina, class III ([place])    DOE (dyspnea on exertion)    CAD S/P percutaneous coronary angioplasty    Hyperlipidemia associated with type 2 diabetes mellitus ([place]) 11/29/2019   Hypertension associated with diabetes ([place]) 11/29/2019   DM type 2 causing vascular disease ([place]) 05/20/2019   Essential hypertension, benign 05/20/2019   Mixed hyperlipidemia 05/20/2019   Coronary artery disease involving native coronary artery of native heart with angina pectoris ([place]) 01/29/2019   Stented coronary artery 02/15/2018   Dizziness 11/20/2017   Stable angina ([place]) 11/19/2017   Bipolar affective disorder ([place]) 08/31/2016   OSA on CPAP 08/31/2016   Morbid obesity due to excess calories ([place]) 01/07/2016   Left sided colitis with rectal bleeding ([place]) 07/15/2015   Environmental allergies 06/02/2014   Moderate persistent asthma without complication 00/34/9179   Depressed 05/01/2013   GERD (gastroesophageal reflux disease) 04/15/2011   History of total knee replacement 04/15/2011    Immunization History  Administered Date(s) Administered   Influenza, High Dose Seasonal PF 02/16/2017    Influenza,inj,Quad PF,6+ Mos 12/18/2013   Moderna Sars-Covid-2 Vaccination 05/01/2019, 05/29/2019, 02/18/2020    Conditions to be addressed/monitored: DMII and Asthma  Care Plan : PHARMD MEDICATION MANAGEMENT  Updates made by Lavera Guise, RPH since 01/17/2021 12:00 AM     Problem: DISEASE PROGRESSION PREVENTION      Long-Range Goal: T2DM, ASTHMA, HLD   Recent Progress: On track  Priority: High  Note:   Current Barriers:  Unable to independently afford treatment regimen Unable to maintain control of T2DM Suboptimal therapeutic regimen for T2DM  Pharmacist Clinical Goal(s):  patient will verbalize ability to afford treatment regimen maintain control of T2DM, HLD as evidenced by GOAL A1C, MORE EFFICACIOUS REGIMEN  through collaboration with PharmD and provider.   Interventions: 1:1 collaboration with Janora Norlander, DO regarding development and update of comprehensive plan of care as evidenced by provider attestation and co-signature Inter-disciplinary care team collaboration (see longitudinal plan of care) Comprehensive medication review performed; medication list updated in electronic medical record  Diabetes: Goal on Track (progressing): YES. Controlled-A1C 6.7%; current treatment: TRESIBA, PIOGLITAZONE;  Would like to optimize regimen to provide CV, kidney benefit, however patient intolerant list makes this challenging Considering GLP1 therapy to d/c pioglitazone CONTINUE TRESIBA-->SAMPLES PROVIDED TO BRIDGE TO PATIENT ASSISTANCE SUPPLY Current glucose readings: fasting glucose: <130, post prandial glucose: <180 Denies hypoglycemic/hyperglycemic symptoms Discussed meal planning options and Plate method for healthy eating Avoid sugary drinks and desserts Incorporate balanced protein, non starchy veggies, 1 serving of carbohydrate with each meal Increase water intake Increase physical activity as able Current exercise: encouraged Educated on diet,  medications Assessed patient finances. Will enroll in novo nordisk PAP (TRESIBA) VS LILLY CARES PAP (BASAGLAR) FOR INSULIN--WILL NEED PATIENT FINANCIALS FOR NOVO NORDISK  Hyperlipidemia:  Goal on Track (progressing): YES. Controlled; current treatment:atorvastatin;  Medications previously tried: n/a  Current dietary patterns: ENCOURAGED HEART HEALTHY to decrease triglycerides Educated on DIET/EXERICISE  ASTHMA -ON SYMBICORT--enrolled in az&me patient assistance program; need to re-enroll for 2023 -Uses CPAP at night -Controlled, continue current  management  Patient Goals/Self-Care Activities patient will:  - take medications as prescribed as evidenced by patient report and record review check glucose DAILY/FASTING, document, and provide at future appointments collaborate with provider on medication access solutions target a minimum of 150 minutes of moderate intensity exercise weekly engage in dietary modifications by FOLLOWING HEART HEALTHY DIET      Medication Assistance:  SEE PREVIOUS NOTE  Patient's preferred pharmacy is:  [id number] [id number]: [phone]: 3[place]Phone: [phone]: 8[place] [facility] [place] [id number]: [phone]: [phone] Follow Up:  Patient agrees to Care Plan and Follow-up.  Plan: Telephone follow up appointment with care management team member scheduled for:  01/2021  JRegina Eck PharmD, BCPS Clinical Pharmacist, [place] Phone [phone]

## 2021-01-17 NOTE — Patient Instructions (Signed)
Visit Information  Thank you for taking time to visit with me today. Please don't hesitate to contact me if I can be of assistance to you before our next scheduled telephone appointment.  Following are the goals we discussed today:  Current Barriers:  Unable to independently afford treatment regimen Unable to maintain control of T2DM Suboptimal therapeutic regimen for T2DM  Pharmacist Clinical Goal(s):  patient will verbalize ability to afford treatment regimen maintain control of T2DM, HLD as evidenced by GOAL A1C, MORE EFFICACIOUS REGIMEN  through collaboration with PharmD and provider.   Interventions: 1:1 collaboration with Janora Norlander, DO regarding development and update of comprehensive plan of care as evidenced by provider attestation and co-signature Inter-disciplinary care team collaboration (see longitudinal plan of care) Comprehensive medication review performed; medication list updated in electronic medical record  Diabetes: Goal on Track (progressing): YES. Controlled-A1C 6.7%; current treatment: TRESIBA, PIOGLITAZONE;  Would like to optimize regimen to provide CV, kidney benefit, however patient intolerant list makes this challenging Considering GLP1 therapy to d/c pioglitazone Current glucose readings: fasting glucose: <130, post prandial glucose: <180 Denies hypoglycemic/hyperglycemic symptoms Discussed meal planning options and Plate method for healthy eating Avoid sugary drinks and desserts Incorporate balanced protein, non starchy veggies, 1 serving of carbohydrate with each meal Increase water intake Increase physical activity as able Current exercise: encouraged Educated on diet, medications Assessed patient finances. Will enroll in novo nordisk PAP VS LILLY CARES PAP FOR INSULIN  Hyperlipidemia:  Goal on Track (progressing): YES. Controlled; current treatment:atorvastatin;  Medications previously tried: n/a  Current dietary patterns: ENCOURAGED HEART  HEALTHY to decrease triglycerides Educated on DIET/EXERICISE  ASTHMA -ON SYMBICORT--enrolled in az&me patient assistance program; need to re-enroll for 2023 -Uses CPAP at night -Controlled, continue current management  Patient Goals/Self-Care Activities patient will:  - take medications as prescribed as evidenced by patient report and record review check glucose DAILY/FASTING, document, and provide at future appointments collaborate with provider on medication access solutions target a minimum of 150 minutes of moderate intensity exercise weekly engage in dietary modifications by [place] DIET   Please call the care guide team at [phone] if you need to cancel or reschedule your appointment.   The patient verbalized understanding of instructions, educational materials, and care plan provided today and declined offer to receive copy of patient instructions, educational materials, and care plan.   Signature Regina Eck, PharmD, BCPS Clinical Pharmacist, [place]  II Phone [phone]

## 2021-01-17 NOTE — Patient Instructions (Signed)
Visit Information  Thank you for taking time to visit with me today. Please don't hesitate to contact me if I can be of assistance to you before our next scheduled telephone appointment.  Following are the goals we discussed today:  Current Barriers:  Unable to independently afford treatment regimen Unable to maintain control of T2DM Suboptimal therapeutic regimen for T2DM  Pharmacist Clinical Goal(s):  patient will verbalize ability to afford treatment regimen maintain control of T2DM, HLD as evidenced by GOAL A1C, MORE EFFICACIOUS REGIMEN  through collaboration with PharmD and provider.   Interventions: 1:1 collaboration with Janora Norlander, DO regarding development and update of comprehensive plan of care as evidenced by provider attestation and co-signature Inter-disciplinary care team collaboration (see longitudinal plan of care) Comprehensive medication review performed; medication list updated in electronic medical record  Diabetes: Goal on Track (progressing): YES. Controlled-A1C 6.7%; current treatment: TRESIBA, PIOGLITAZONE;  Would like to optimize regimen to provide CV, kidney benefit, however patient intolerant list makes this challenging Considering GLP1 therapy to d/c pioglitazone CONTINUE TRESIBA-->SAMPLES PROVIDED TO BRIDGE TO PATIENT ASSISTANCE SUPPLY Current glucose readings: fasting glucose: <130, post prandial glucose: <180 Denies hypoglycemic/hyperglycemic symptoms Discussed meal planning options and Plate method for healthy eating Avoid sugary drinks and desserts Incorporate balanced protein, non starchy veggies, 1 serving of carbohydrate with each meal Increase water intake Increase physical activity as able Current exercise: encouraged Educated on diet, medications Assessed patient finances. Will enroll in novo nordisk PAP (TRESIBA) VS LILLY CARES PAP (BASAGLAR) FOR INSULIN--WILL NEED PATIENT FINANCIALS FOR NOVO NORDISK  Hyperlipidemia:  Goal on Track  (progressing): YES. Controlled; current treatment:atorvastatin;  Medications previously tried: n/a  Current dietary patterns: ENCOURAGED HEART HEALTHY to decrease triglycerides Educated on DIET/EXERICISE  ASTHMA -ON SYMBICORT--enrolled in az&me patient assistance program; need to re-enroll for 2023 -Uses CPAP at night -Controlled, continue current management  Patient Goals/Self-Care Activities patient will:  - take medications as prescribed as evidenced by patient report and record review check glucose DAILY/FASTING, document, and provide at future appointments collaborate with provider on medication access solutions target a minimum of 150 minutes of moderate intensity exercise weekly engage in dietary modifications by [place] DIET   Please call the care guide team at [phone] if you need to cancel or reschedule your appointment.   The patient verbalized understanding of instructions, educational materials, and care plan provided today and declined offer to receive copy of patient instructions, educational materials, and care plan.   Signature Regina Eck, PharmD, BCPS Clinical Pharmacist, [place]  II Phone [phone]

## 2021-01-17 NOTE — Progress Notes (Signed)
Chronic Care Management Pharmacy Note  01/07/2021 Name:  Amber Reeves MRN:  [id number] DOB:  11-28-1950  Summary: T2DM, ASTHMA, HLD  Recommendations/Changes made from today's visit: Diabetes: Goal on Track (progressing): YES. Controlled-A1C 6.7%; current treatment: TRESIBA, PIOGLITAZONE;  Would like to optimize regimen to provide CV, kidney benefit, however patient intolerant list makes this challenging Considering GLP1 therapy to d/c pioglitazone Current glucose readings: fasting glucose: <130, post prandial glucose: <180 Denies hypoglycemic/hyperglycemic symptoms Discussed meal planning options and Plate method for healthy eating Avoid sugary drinks and desserts Incorporate balanced protein, non starchy veggies, 1 serving of carbohydrate with each meal Increase water intake Increase physical activity as able Current exercise: encouraged Educated on diet, medications Assessed patient finances. Will enroll in novo nordisk PAP VS LILLY CARES PAP FOR INSULIN  Hyperlipidemia:  Goal on Track (progressing): YES. Controlled; current treatment:atorvastatin;  Medications previously tried: n/a  Current dietary patterns: ENCOURAGED HEART HEALTHY to decrease triglycerides Educated on DIET/EXERICISE  ASTHMA -ON SYMBICORT--[place] in [place]; need to re-enroll for 2023 -Uses CPAP at night -Controlled, continue current management  Patient Goals/Self-Care Activities patient will:  - take medications as prescribed as evidenced by patient report and record review check glucose DAILY/FASTING, document, and provide at future appointments collaborate with provider on medication access solutions target a minimum of 150 minutes of moderate intensity exercise weekly engage in dietary modifications by FOLLOWING HEART HEALTHY DIET  Plan: F/U IN DECEMBER  Subjective: Amber Reeves is an 70 y.o. year old female who is a primary patient of Amber Norlander, DO.  The CCM team was consulted for assistance with disease management and care coordination needs.    Engaged with patient by telephone for initial visit in response to provider referral for pharmacy case management and/or care coordination services.   Consent to Services:  The patient was given the following information about Chronic Care Management services today, agreed to services, and gave verbal consent: 1. CCM service includes personalized support from designated clinical staff supervised by the primary care provider, including individualized plan of care and coordination with other care providers 2. 24/7 contact phone numbers for assistance for urgent and routine care needs. 3. Service will only be billed when office clinical staff spend 20 minutes or more in a month to coordinate care. 4. Only one practitioner may furnish and bill the service in a calendar month. 5.The patient may stop CCM services at any time (effective at the end of the month) by phone call to the office staff. 6. The patient will be responsible for cost sharing (co-pay) of up to 20% of the service fee (after annual deductible is met). Patient agreed to services and consent obtained.  Patient Care Team: Amber Norlander, DO as PCP - General (Family Medicine) Harl Bowie, Alphonse Guild, MD as PCP - Cardiology (Cardiology) Cassandria Anger, MD as Consulting Physician (Endocrinology) Lavera Guise, [facility] as Pharmacist (Family Medicine)  Objective:  Lab Results  Component Value Date   CREATININE 1.00 09/30/2020   CREATININE 1.22 (H) 09/16/2020   CREATININE 0.97 08/20/2020    Lab Results  Component Value Date   HGBA1C 6.7 09/30/2020   Last diabetic Eye exam:  Lab Results  Component Value Date/Time   HMDIABEYEEXA No Retinopathy 08/25/2020 12:00 AM    Last diabetic Foot exam: No results found for: HMDIABFOOTEX      Component Value Date/Time   CHOL 151 11/12/2019 1520   TRIG 208 (H) 11/12/2019 1520  HDL  45 11/12/2019 1520   CHOLHDL 3.4 11/12/2019 1520   LDLCALC 72 11/12/2019 1520    Hepatic Function Latest Ref Rng & Units 11/12/2019 01/29/2019  Total Protein 6.0 - 8.5 g/dL 6.9 7.2  Albumin 3.8 - 4.8 g/dL 3.8 4.0  AST 0 - 40 IU/L 22 17  ALT 0 - 32 IU/L 23 16  Alk Phosphatase 44 - 121 IU/L 59 67  Total Bilirubin 0.0 - 1.2 mg/dL 0.4 0.6    No results found for: TSH, FREET4  CBC Latest Ref Rng & Units 09/22/2020 09/22/2020 09/22/2020  WBC 4.0 - 10.5 K/uL - - -  Hemoglobin 12.0 - 15.0 g/dL 10.5(L) 10.5(L) 10.5(L)  Hematocrit 36.0 - 46.0 % 31.0(L) 31.0(L) 31.0(L)  Platelets 150 - 400 K/uL - - -    No results found for: VD25OH  Clinical ASCVD: No  The 10-year ASCVD risk score (Arnett DK, et al., 2019) is: 26.1%   Values used to calculate the score:     Age: 70 years     Sex: Female     Is Non-Hispanic African American: No     Diabetic: Yes     Tobacco smoker: No     Systolic Blood Pressure: 628 mmHg     Is BP treated: Yes     HDL Cholesterol: 45 mg/dL     Total Cholesterol: 151 mg/dL    Other: (CHADS2VASc if Afib, PHQ9 if depression, MMRC or CAT for COPD, ACT, DEXA)  Social History   Tobacco Use  Smoking Status Never  Smokeless Tobacco Never   BP Readings from Last 3 Encounters:  09/30/20 (!) 141/79  09/28/20 (!) 146/64  09/22/20 136/60   Pulse Readings from Last 3 Encounters:  09/30/20 (!) 58  09/28/20 (!) 56  09/22/20 (!) 58   Wt Readings from Last 3 Encounters:  09/30/20 229 lb 12.8 oz (104.2 kg)  09/28/20 229 lb 9.6 oz (104.1 kg)  09/22/20 230 lb (104.3 kg)    Assessment: Review of patient past medical history, allergies, medications, health status, including review of consultants reports, laboratory and other test data, was performed as part of comprehensive evaluation and provision of chronic care management services.   SDOH:  (Social Determinants of Health) assessments and interventions performed:    CCM Care Plan  Allergies  Allergen Reactions    Aspartame Rash   Penicillins Itching and Rash    Did it involve swelling of the face/tongue/throat, SOB, or low BP? No Did it involve sudden or severe rash/hives, skin peeling, or any reaction on the inside of your mouth or nose? No Did you need to seek medical attention at a hospital or doctor's office? No When did it last happen?~25 years ago       If all above answers are "NO", may proceed with cephalosporin use.    Wilder Glade [Dapagliflozin]     UTIs   Metformin And Related     GI distress, dizziness   No [facility] Food Allergies     Honey-Rash Artificial Sweeteners-rash   Paxil [Paroxetine Hcl] Other (See Comments)    Twitching/feels like skin crawling   Prozac [Fluoxetine Hcl] Other (See Comments)    Twitching/feels like skin crawling   Zoloft [Sertraline Hcl] Other (See Comments)    Twitching/feels like skin crawling   Ranexa [Ranolazine] Palpitations    Heart racing    Medications Reviewed Today     Reviewed by Lavera Guise, [facility] (Pharmacist) on 01/13/21 at 1340  Med List Status: <None>  Medication Order Taking? Sig Documenting Provider Last Dose Status Informant  albuterol (VENTOLIN HFA) 108 (90 Base) MCG/ACT inhaler [id number] No TAKE 2 PUFFS BY MOUTH EVERY 4 HOURS AS NEEDED FOR WHEEZE Ronnie Doss M, DO Taking Active Self  Alcohol Swabs (B-D SINGLE USE SWABS REGULAR) PADS [id number] No Apply topically. [provider] Taking Active Self  amLODipine (NORVASC) 5 MG tablet [id number] No Take 1 tablet (5 mg total) by mouth daily. Verta Ellen., NP Taking Active   aspirin EC 81 MG tablet [id number] No Take 81 mg by mouth in the morning. [provider] Taking Active Self  atorvastatin (LIPITOR) 40 MG tablet [id number]  Take 1 tablet (40 mg total) by mouth at bedtime. Ronnie Doss M, DO  Active   Blood Glucose Calibration (ACCU-CHEK AVIVA) SOLN [id number] No 2 Bottles by Other route as needed. [provider] Taking Active Self   budesonide-formoterol (SYMBICORT) 160-4.5 MCG/ACT inhaler [id number] No Inhale 2 puffs into the lungs 2 (two) times daily. Ronnie Doss M, DO Taking Active Self  Calcium Carb-Cholecalciferol (CALCIUM 600+D3 PO) [id number] No Take 1 tablet by mouth in the morning. [provider] Taking Active Self  diphenhydrAMINE (BENADRYL) 25 MG tablet [id number] No Take 50 mg by mouth at bedtime. [provider] Taking Active Self  docusate sodium (COLACE) 100 MG capsule [id number] No Take 100 mg by mouth 2 (two) times daily as needed (constipation.).  [provider] Taking Active Self  fluticasone (FLONASE) 50 MCG/ACT nasal spray [id number]  Place 2 sprays into both nostrils daily. Ronnie Doss M, DO  Active   furosemide (LASIX) 20 MG tablet [id number]  Take 1 tablet (20 mg total) by mouth as needed (based on weight - may take one extra tab for weight gain of 3lb in 24 hrs or 5lb in 1 week). Verta Ellen., NP  Active   glucose blood test strip [id number] No TEST BLOOD SUGAR TWICE DAILY [provider] Taking Active Self  ibuprofen (ADVIL) 200 MG tablet [id number] No Take 400 mg by mouth daily as needed (pain (leg pain)). [provider] Taking Active Self  insulin degludec (TRESIBA FLEXTOUCH) 200 UNIT/ML FlexTouch Pen [id number] No Inject 40 Units into the skin daily.  Patient taking differently: Inject 40 Units into the skin in the morning.   Ronnie Doss M, DO Taking Active   Insulin Syringe-Needle U-100 31G X 5/16" 0.5 ML MISC [id number] No Use to inject Insulin four times daily: Use as directed; Dx: E11.9, E66.9 [provider] Taking Active Self  ipratropium-albuterol (DUONEB) 0.5-2.5 (3) MG/3ML SOLN [id number]  TAKE 3 ML (1 VIAL) BY NEBULIZATION EVERY 6 HOURS AS NEEDED FOR WHEEZING. Ronnie Doss M, DO  Active   isosorbide mononitrate (IMDUR) 60 MG 24 hr tablet [id number] No Take 2 tablets (120 mg total) by mouth daily.  Patient taking  differently: Take 60 mg by mouth in the morning and at bedtime.   Arnoldo Lenis, MD Taking Active Self  lansoprazole (PREVACID) 15 MG capsule [id number] No Take 30 mg by mouth at bedtime. [provider] Taking Active Self  lisinopril (ZESTRIL) 40 MG tablet [id number]  TAKE 1 TABLET EVERY DAY Ronnie Doss M, DO  Active   LORazepam (ATIVAN) 0.5 MG tablet [id number]  Take 1 tablet 30 minutes before procedure.  May repeat 1 time. Ronnie Doss M, DO  Active   magnesium oxide (MAG-OX) 400 MG tablet [id number]  Take magnesium on days you take lasix Amber Norlander, DO  Active  metoprolol tartrate (LOPRESSOR) 25 MG tablet [id number] No Take 1 tablet (25 mg total) by mouth 2 (two) times daily. Ronnie Doss M, DO Taking Active Self  montelukast (SINGULAIR) 10 MG tablet [id number]  Take 1 tablet (10 mg total) by mouth at bedtime. Ronnie Doss M, DO  Active   nitroGLYCERIN (NITROSTAT) 0.4 MG SL tablet [id number] No Place 1 tablet (0.4 mg total) under the tongue every 5 (five) minutes as needed for chest pain.  Patient taking differently: Place 0.4 mg under the tongue every 5 (five) minutes x 3 doses as needed for chest pain.   Verta Ellen., NP Taking Active Self  pioglitazone (ACTOS) 30 MG tablet [id number]  TAKE 1 TABLET BY MOUTH EVERY DAY Ronnie Doss M, DO  Active   potassium chloride (KLOR-CON M10) 10 MEQ tablet [id number]  TAKE 1 TABLET (10 MEQ TOTAL) BY MOUTH EVERY EVENING. Ronnie Doss M, DO  Active   sodium chloride flush (NS) 0.9 % injection 3 mL [id number]   Arnoldo Lenis, MD  Active             Patient Active Problem List   Diagnosis Date Noted   Angina, class III ([place])    DOE (dyspnea on exertion)    CAD S/P percutaneous coronary angioplasty    Hyperlipidemia associated with type 2 diabetes mellitus ([place]) 11/29/2019   Hypertension associated with diabetes ([place]) 11/29/2019   DM type 2 causing vascular disease ([place]) 05/20/2019   Essential  hypertension, benign 05/20/2019   Mixed hyperlipidemia 05/20/2019   Coronary artery disease involving native coronary artery of native heart with angina pectoris ([place]) 01/29/2019   Stented coronary artery 02/15/2018   Dizziness 11/20/2017   Stable angina ([place]) 11/19/2017   Bipolar affective disorder ([place]) 08/31/2016   OSA on CPAP 08/31/2016   Morbid obesity due to excess calories ([place]) 01/07/2016   Left sided colitis with rectal bleeding ([place]) 07/15/2015   Environmental allergies 06/02/2014   Moderate persistent asthma without complication 33/35/4562   Depressed 05/01/2013   GERD (gastroesophageal reflux disease) 04/15/2011   History of total knee replacement 04/15/2011    Immunization History  Administered Date(s) Administered   Influenza, High Dose Seasonal PF 02/16/2017   Influenza,inj,Quad PF,6+ Mos 12/18/2013   Moderna Sars-Covid-2 Vaccination 05/01/2019, 05/29/2019, 02/18/2020    Conditions to be addressed/monitored: HLD, DMII, and Asthma  Care Plan : PHARMD MEDICATION MANAGEMENT  Updates made by Lavera Guise, RPH since 01/17/2021 12:00 AM     Problem: DISEASE PROGRESSION PREVENTION      Long-Range Goal: T2DM, ASTHMA, HLD   This Visit's Progress: On track  Priority: High  Note:   Current Barriers:  Unable to independently afford treatment regimen Unable to maintain control of T2DM Suboptimal therapeutic regimen for T2DM  Pharmacist Clinical Goal(s):  patient will verbalize ability to afford treatment regimen maintain control of T2DM, HLD as evidenced by GOAL A1C, MORE EFFICACIOUS REGIMEN  through collaboration with PharmD and provider.   Interventions: 1:1 collaboration with Amber Norlander, DO regarding development and update of comprehensive plan of care as evidenced by provider attestation and co-signature Inter-disciplinary care team collaboration (see longitudinal plan of care) Comprehensive medication review performed; medication list updated in  electronic medical record  Diabetes: Goal on Track (progressing): YES. Controlled-A1C 6.7%; current treatment: TRESIBA, PIOGLITAZONE;  Would like to optimize regimen to provide CV, kidney benefit, however patient intolerant list makes this challenging Considering GLP1 therapy to d/c pioglitazone Current glucose readings: fasting glucose: <130, post prandial glucose: <  180 Denies hypoglycemic/hyperglycemic symptoms Discussed meal planning options and Plate method for healthy eating Avoid sugary drinks and desserts Incorporate balanced protein, non starchy veggies, 1 serving of carbohydrate with each meal Increase water intake Increase physical activity as able Current exercise: encouraged Educated on diet, medications Assessed patient finances. Will enroll in novo nordisk PAP VS LILLY CARES PAP FOR INSULIN  Hyperlipidemia:  Goal on Track (progressing): YES. Controlled; current treatment:atorvastatin;  Medications previously tried: n/a  Current dietary patterns: ENCOURAGED HEART HEALTHY to decrease triglycerides Educated on DIET/EXERICISE  ASTHMA -ON SYMBICORT--[place]; need to re-enroll for 2023 -Uses CPAP at night -Controlled, continue current management  Patient Goals/Self-Care Activities patient will:  - take medications as prescribed as evidenced by patient report and record review check glucose DAILY/FASTING, document, and provide at future appointments collaborate with provider on medication access solutions target a minimum of 150 minutes of moderate intensity exercise weekly engage in dietary modifications by FOLLOWING HEART HEALTHY DIET      Medication Assistance:  SYMBICORT obtained through [place]  medication assistance program.  Enrollment ends 02/20/21 TRESIBA OBTAINED THROUGH [place] PATIENT ASSISTANCE PROGRAM--ENDS 02/20/21   Patient's preferred pharmacy is:  [id number] [place]Phone: [phone]: [phone] [facility] [place]Phone: [phone]: [address]Phone: [phone]: [phone] Uses pill box? No - N/A Pt endorses 100% compliance  Follow Up:  Patient agrees to Care Plan and Follow-up.  Plan: Telephone follow up appointment with care management team member scheduled for:  01/2021  JRegina Eck PharmD, BCPS Clinical Pharmacist, [place] Phone [phone]

## 2021-01-20 DIAGNOSIS — E782 Mixed hyperlipidemia: Secondary | ICD-10-CM

## 2021-01-20 DIAGNOSIS — E1159 Type 2 diabetes mellitus with other circulatory complications: Secondary | ICD-10-CM

## 2021-01-20 DIAGNOSIS — J4540 Moderate persistent asthma, uncomplicated: Secondary | ICD-10-CM

## 2021-01-20 DIAGNOSIS — Z7984 Long term (current) use of oral hypoglycemic drugs: Secondary | ICD-10-CM | POA: Diagnosis not present

## 2021-01-22 NOTE — Progress Notes (Signed)
Received notification from [employer] regarding RE ENROLLMENT approval for SYMBICORT 160. Patient assistance approved from 02/21/21 to 02/20/21.  Phone: [phone]

## 2021-01-28 ENCOUNTER — Ambulatory Visit (INDEPENDENT_AMBULATORY_CARE_PROVIDER_SITE_OTHER): Payer: Medicare HMO | Admitting: Family Medicine

## 2021-01-28 ENCOUNTER — Encounter: Payer: Self-pay | Admitting: Family Medicine

## 2021-01-28 VITALS — BP 139/61 | HR 58 | Temp 97.9°F | Ht 63.0 in | Wt 228.6 lb

## 2021-01-28 DIAGNOSIS — E1159 Type 2 diabetes mellitus with other circulatory complications: Secondary | ICD-10-CM | POA: Diagnosis not present

## 2021-01-28 DIAGNOSIS — I25119 Atherosclerotic heart disease of native coronary artery with unspecified angina pectoris: Secondary | ICD-10-CM | POA: Diagnosis not present

## 2021-01-28 DIAGNOSIS — R5381 Other malaise: Secondary | ICD-10-CM

## 2021-01-28 DIAGNOSIS — I152 Hypertension secondary to endocrine disorders: Secondary | ICD-10-CM | POA: Diagnosis not present

## 2021-01-28 LAB — BAYER DCA HB A1C WAIVED: HB A1C (BAYER DCA - WAIVED): 5.8 % — ABNORMAL HIGH (ref 4.8–5.6)

## 2021-01-28 NOTE — Progress Notes (Signed)
Subjective: CC: DM PCP: Janora Norlander, DO [id number] Amber Reeves is a 70 y.o. female presenting to clinic today for:  1. Type 2 Diabetes with hypertension, hyperlipidemia, CAD:  Patient is compliant with 40 units of Tresiba daily, Actos 30 mg daily, lisinopril 40 mg daily, metoprolol 25 mg twice daily and Lipitor 40 mg daily.  She denies any hypoglycemic episodes.  She admits that sometimes she does not keep a very strict diet but she tries her best to eat low-carb.  She continues to have dyspnea on exertion.  She utilizes her inhalers for this and this seems to help some  Last eye exam: Up-to-date Last foot exam: Up-to-date Last A1c:  Lab Results  Component Value Date   HGBA1C 5.8 (H) 01/28/2021   Nephropathy screen indicated?:  Up-to-date Last flu, zoster and/or pneumovax:  Immunization History  Administered Date(s) Administered   Influenza, High Dose Seasonal PF 02/16/2017   Influenza,inj,Quad PF,6+ Mos 12/18/2013   Moderna Sars-Covid-2 Vaccination 05/01/2019, 05/29/2019, 02/18/2020    ROS: Per HPI  Allergies  Allergen Reactions   Aspartame Rash   Penicillins Itching and Rash    Did it involve swelling of the face/tongue/throat, SOB, or low BP? No Did it involve sudden or severe rash/hives, skin peeling, or any reaction on the inside of your mouth or nose? No Did you need to seek medical attention at a hospital or doctor's office? No When did it last happen?~25 years ago       If all above answers are "NO", may proceed with cephalosporin use.    Farxiga [Dapagliflozin]     UTIs   Metformin And Related     GI distress, dizziness   No [facility] Food Allergies     Honey-Rash Artificial Sweeteners-rash   Paxil [Paroxetine Hcl] Other (See Comments)    Twitching/feels like skin crawling   Prozac [Fluoxetine Hcl] Other (See Comments)    Twitching/feels like skin crawling   Zoloft [Sertraline Hcl] Other (See Comments)    Twitching/feels like skin crawling    Ranexa [Ranolazine] Palpitations    Heart racing   Past Medical History:  Diagnosis Date   Allergy    Anxiety    Arthritis    Asthma    Depression    Diabetes mellitus without complication (HCC)    GERD (gastroesophageal reflux disease)    Hyperlipidemia    Hypertension     Current Outpatient Medications:    albuterol (VENTOLIN HFA) 108 (90 Base) MCG/ACT inhaler, TAKE 2 PUFFS BY MOUTH EVERY 4 HOURS AS NEEDED FOR WHEEZE, Disp: 18 g, Rfl: 3   Alcohol Swabs (B-D SINGLE USE SWABS REGULAR) PADS, Apply topically., Disp: , Rfl:    amLODipine (NORVASC) 5 MG tablet, Take 1 tablet (5 mg total) by mouth daily., Disp: 30 tablet, Rfl: 6   aspirin EC 81 MG tablet, Take 81 mg by mouth in the morning., Disp: , Rfl:    atorvastatin (LIPITOR) 40 MG tablet, Take 1 tablet (40 mg total) by mouth at bedtime., Disp: 90 tablet, Rfl: 3   Blood Glucose Calibration (ACCU-CHEK AVIVA) SOLN, 2 Bottles by Other route as needed., Disp: , Rfl:    budesonide-formoterol (SYMBICORT) 160-4.5 MCG/ACT inhaler, Inhale 2 puffs into the lungs 2 (two) times daily., Disp: 4 each, Rfl: 11   Calcium Carb-Cholecalciferol (CALCIUM 600+D3 PO), Take 1 tablet by mouth in the morning., Disp: , Rfl:    diphenhydrAMINE (BENADRYL) 25 MG tablet, Take 50 mg by mouth at bedtime., Disp: , Rfl:  docusate sodium (COLACE) 100 MG capsule, Take 100 mg by mouth 2 (two) times daily as needed (constipation.). , Disp: , Rfl:    fluticasone (FLONASE) 50 MCG/ACT nasal spray, Place 2 sprays into both nostrils daily., Disp: 48 g, Rfl: 3   furosemide (LASIX) 20 MG tablet, Take 1 tablet (20 mg total) by mouth as needed (based on weight - may take one extra tab for weight gain of 3lb in 24 hrs or 5lb in 1 week)., Disp: , Rfl:    glucose blood test strip, TEST BLOOD SUGAR TWICE DAILY, Disp: , Rfl:    ibuprofen (ADVIL) 200 MG tablet, Take 400 mg by mouth daily as needed (pain (leg pain))., Disp: , Rfl:    insulin degludec (TRESIBA FLEXTOUCH) 200 UNIT/ML  FlexTouch Pen, Inject 40 Units into the skin daily. (Patient taking differently: Inject 40 Units into the skin in the morning.), Disp: 18 mL, Rfl: 0   Insulin Syringe-Needle U-100 31G X 5/16" 0.5 ML MISC, Use to inject Insulin four times daily: Use as directed; Dx: E11.9, E66.9, Disp: , Rfl:    ipratropium-albuterol (DUONEB) 0.5-2.5 (3) MG/3ML SOLN, TAKE 3 ML (1 VIAL) BY NEBULIZATION EVERY 6 HOURS AS NEEDED FOR WHEEZING., Disp: 360 mL, Rfl: 0   isosorbide mononitrate (IMDUR) 60 MG 24 hr tablet, Take 2 tablets (120 mg total) by mouth daily. (Patient taking differently: Take 60 mg by mouth in the morning and at bedtime.), Disp: 180 tablet, Rfl: 3   lansoprazole (PREVACID) 15 MG capsule, Take 30 mg by mouth at bedtime., Disp: , Rfl:    lisinopril (ZESTRIL) 40 MG tablet, TAKE 1 TABLET EVERY DAY, Disp: 90 tablet, Rfl: 0   magnesium oxide (MAG-OX) 400 MG tablet, Take magnesium on days you take lasix, Disp: 60 tablet, Rfl: 3   metoprolol tartrate (LOPRESSOR) 25 MG tablet, Take 1 tablet (25 mg total) by mouth 2 (two) times daily., Disp: 180 tablet, Rfl: 3   montelukast (SINGULAIR) 10 MG tablet, Take 1 tablet (10 mg total) by mouth at bedtime., Disp: 90 tablet, Rfl: 3   nitroGLYCERIN (NITROSTAT) 0.4 MG SL tablet, Place 1 tablet (0.4 mg total) under the tongue every 5 (five) minutes as needed for chest pain. (Patient taking differently: Place 0.4 mg under the tongue every 5 (five) minutes x 3 doses as needed for chest pain.), Disp: 25 tablet, Rfl: 3   pioglitazone (ACTOS) 30 MG tablet, TAKE 1 TABLET BY MOUTH EVERY DAY, Disp: 90 tablet, Rfl: 0   potassium chloride (KLOR-CON M10) 10 MEQ tablet, TAKE 1 TABLET (10 MEQ TOTAL) BY MOUTH EVERY EVENING., Disp: 90 tablet, Rfl: 0  Current Facility-Administered Medications:    sodium chloride flush (NS) 0.9 % injection 3 mL, 3 mL, Intravenous, Q12H, Branch, Alphonse Guild, MD Social History   Socioeconomic History   Marital status: Married    Spouse name: Not on file    Number of children: Not on file   Years of education: Not on file   Highest education level: Not on file  Occupational History   Not on file  Tobacco Use   Smoking status: Never   Smokeless tobacco: Never  Vaping Use   Vaping Use: Never used  Substance and Sexual Activity   Alcohol use: Never   Drug use: Never   Sexual activity: Not Currently  Other Topics Concern   Not on file  Social History Narrative   Resides at home with her husband.  She is originally from [place] but relocated to [place] and then  to [place] in August 2020.  She has a son who lives about 2 hours away in [place] and a sister-in-law who lives in [place].   Social Determinants of Health   Financial Resource Strain: Low Risk    Difficulty of Paying Living Expenses: Not hard at all  Food Insecurity: No Food Insecurity   Worried About Charity fundraiser in the Last Year: Never true   [place] in the Last Year: Never true  Transportation Needs: No Transportation Needs   Lack of Transportation (Medical): No   Lack of Transportation (Non-Medical): No  Physical Activity: Inactive   Days of Exercise per Week: 0 days   Minutes of Exercise per Session: 0 min  Stress: No Stress Concern Present   Feeling of Stress : Not at all  Social Connections: Moderately Isolated   Frequency of Communication with Friends and Family: More than three times a week   Frequency of Social Gatherings with Friends and Family: More than three times a week   Attends Religious Services: Never   Marine scientist or Organizations: No   Attends Music therapist: Never   Marital Status: Married  Human resources officer Violence: Not At Risk   Fear of Current or Ex-Partner: No   Emotionally Abused: No   Physically Abused: No   Sexually Abused: No   Family History  Problem Relation Age of Onset   Heart disease Mother    Stroke Mother    Hypertension Mother    Cancer Father    Stroke Father      Objective: Office vital signs reviewed. BP 139/61   Pulse (!) 58   Temp 97.9 F (36.6 C) (Temporal)   Ht 5' 3"  (1.6 m)   Wt 228 lb 9.6 oz (103.7 kg)   SpO2 100%   BMI 40.49 kg/m   Physical Examination:  General: Awake, alert, obese, No acute distress HEENT: Normal; sclera white Cardio: Bradycardic with regular rhythm, S1S2 heard, no murmurs appreciated Pulm: clear to auscultation bilaterally, no wheezes, rhonchi or rales; normal work of breathing on room air MSK: Slow gait.  Assessment/ Plan: 70 y.o. female   DM type 2 causing vascular disease ([place]) - Plan: Bayer DCA Hb A1c Waived  Hypertension associated with diabetes ([place]) - Plan: Basic Metabolic Panel  Coronary artery disease involving native coronary artery of native heart with angina pectoris ([place]) - Plan: LDL Cholesterol, Direct  Physical deconditioning  Sugar remains under good control with A1c of 5.8.  Discussed consideration of reduction in [place] to 38 units given advanced age I think she might be a little too tightly controlled.  Would like to see her level somewhere around 6-6.5 given multiple comorbidities.  She will continue the Actos at current dose  Blood pressure is controlled.  No changes there.  Check BMP  Given nonfasting status LDL has been ordered.  She will continue beta-blocker, calcium channel blocker (which apparently has made a huge POSITIVE impact on the chest pain that she been experiencing) and statin as prescribed.  We talked about ways to gradually improve exercise tolerance.  She unfortunately remains physically deconditioned and easily winded.  This has been evaluated by cardiology and not felt to be cardiac in nature  Orders Placed This Encounter  Procedures   Bayer [place] Hb A1c Waived   Basic Metabolic Panel   LDL Cholesterol, Direct   No orders of the defined types were placed in this encounter.     Jerilynn Mages  Lajuana Ripple, [place] [phone]

## 2021-01-29 LAB — BASIC METABOLIC PANEL
BUN/Creatinine Ratio: 16 (ref 12–28)
BUN: 19 mg/dL (ref 8–27)
CO2: 22 mmol/L (ref 20–29)
Calcium: 9.3 mg/dL (ref 8.7–10.3)
Chloride: 105 mmol/L (ref 96–106)
Creatinine, Ser: 1.16 mg/dL — ABNORMAL HIGH (ref 0.57–1.00)
Glucose: 130 mg/dL — ABNORMAL HIGH (ref 70–99)
Potassium: 4.5 mmol/L (ref 3.5–5.2)
Sodium: 142 mmol/L (ref 134–144)
eGFR: 51 mL/min/{1.73_m2} — ABNORMAL LOW (ref >59)

## 2021-01-29 LAB — LDL CHOLESTEROL, DIRECT: LDL Direct: 80 mg/dL (ref 0–99)

## 2021-02-02 NOTE — Progress Notes (Signed)
Received notification from [facility] regarding RE-ENROLLMENT approval for [place]. Patient assistance approved from 02/21/21 to 02/20/22.  MEDICATION WILL SHIP OUT FIRST WEEK OF January [address]23  Phone: [phone]

## 2021-02-11 ENCOUNTER — Ambulatory Visit (INDEPENDENT_AMBULATORY_CARE_PROVIDER_SITE_OTHER): Payer: Medicare HMO | Admitting: Pharmacist

## 2021-02-11 DIAGNOSIS — J4540 Moderate persistent asthma, uncomplicated: Secondary | ICD-10-CM

## 2021-02-11 DIAGNOSIS — E1159 Type 2 diabetes mellitus with other circulatory complications: Secondary | ICD-10-CM

## 2021-02-11 MED ORDER — BUDESONIDE-FORMOTEROL FUMARATE 160-4.5 MCG/ACT IN AERO: 2.0000 | INHALATION_SPRAY | Freq: Two times a day (BID) | RESPIRATORY_TRACT | 11 refills | Status: DC

## 2021-02-11 MED ORDER — ALBUTEROL SULFATE HFA 108 (90 BASE) MCG/ACT IN AERS: INHALATION_SPRAY | RESPIRATORY_TRACT | 3 refills | Status: DC

## 2021-02-11 NOTE — Patient Instructions (Signed)
Visit Information  Thank you for taking time to visit with me today. Please don't hesitate to contact me if I can be of assistance to you before our next scheduled telephone appointment.  Following are the goals we discussed today:  Current Barriers:  Unable to independently afford treatment regimen Unable to maintain control of T2DM Suboptimal therapeutic regimen for T2DM  Pharmacist Clinical Goal(s):  patient will verbalize ability to afford treatment regimen maintain control of T2DM, HLD as evidenced by GOAL A1C, MORE EFFICACIOUS REGIMEN  through collaboration with PharmD and provider.   Interventions: 1:1 collaboration with Janora Norlander, DO regarding development and update of comprehensive plan of care as evidenced by provider attestation and co-signature Inter-disciplinary care team collaboration (see longitudinal plan of care) Comprehensive medication review performed; medication list updated in electronic medical record  Diabetes: Goal on Track (progressing): YES. Controlled-A1C 5.8%; current treatment: TRESIBA, PIOGLITAZONE;  Would like to optimize regimen to provide CV, kidney benefit, however patient intolerant list makes this challenging Considering GLP1 therapy to d/c pioglitazone CONTINUE TRESIBA-->SAMPLES PROVIDED TO [place] TO PATIENT ASSISTANCE SUPPLY Current glucose readings: fasting glucose: n/a' post prandial glucose: n/a Patient not testing at home Recommended patient test--we can likely d/c pioglitazone  Concerned for hypoglycemia Denies hypoglycemic/hyperglycemic symptoms Discussed meal planning options and Plate method for healthy eating Avoid sugary drinks and desserts Incorporate balanced protein, non starchy veggies, 1 serving of carbohydrate with each meal Increase water intake Increase physical activity as able Current exercise: encouraged Educated on diet, medications Assessed patient finances. Will enroll in novo nordisk PAP (TRESIBA) VS LILLY  CARES PAP (BASAGLAR) FOR INSULIN--WILL NEED PATIENT FINANCIALS FOR NOVO NORDISK  Hyperlipidemia:  Goal on Track (progressing): YES. Controlled; current treatment:atorvastatin;  Medications previously tried: n/a  Current dietary patterns: ENCOURAGED HEART HEALTHY to decrease triglycerides/cholesterol Educated on DIET/EXERICISE  ASTHMA -ON SYMBICORT--[place]; [place]-ENROLLMENT SENT FOR 2023 TO [place] -Uses rescue albuterol occasionally --application submitted to [facility] patient assistance -Uses CPAP at night -Controlled, continue current management  Patient Goals/Self-Care Activities patient will:  - take medications as prescribed as evidenced by patient report and record review check glucose DAILY/FASTING, document, and provide at future appointments collaborate with provider on medication access solutions target a minimum of 150 minutes of moderate intensity exercise weekly engage in dietary modifications by [place] DIET   Please call the care guide team at [phone] if you need to cancel or reschedule your appointment.  The patient verbalized understanding of instructions, educational materials, and care plan provided today and declined offer to receive copy of patient instructions, educational materials, and care plan.   Signature Regina Eck, PharmD, BCPS Clinical Pharmacist, [place]  II Phone [phone]

## 2021-02-11 NOTE — Progress Notes (Signed)
Chronic Care Management Pharmacy Note  02/11/2021 Name:  Amber Reeves MRN:  [id number] DOB:  [birth year]  Summary: T2DM, ASTHMA  Recommendations/Changes made from today's visit:   Diabetes: Goal on Track (progressing): YES. Controlled-A1C 5.8%; current treatment: TRESIBA, PIOGLITAZONE;  Would like to optimize regimen to provide CV, kidney benefit, however patient intolerant list makes this challenging Considering GLP1 therapy to d/c pioglitazone CONTINUE TRESIBA, decrease to 38 units given lower a1c, patient needs to check BG at [facility] be able to d/c pioglitazone PAP approved for tresiba/novo nordisk PAP for 2023--med to ship to pcp office per program Current glucose readings: fasting glucose: n/a' post prandial glucose: n/a Patient not testing at home Recommended patient test--we can likely d/c pioglitazone  Concerned for hypoglycemia Denies hypoglycemic/hyperglycemic symptoms Discussed meal planning options and Plate method for healthy eating Avoid sugary drinks and desserts Incorporate balanced protein, non starchy veggies, 1 serving of carbohydrate with each meal Increase water intake Increase physical activity as able Current exercise: encouraged Educated on diet, medications Assessed patient finances. Re-enrolled in novo nordisk PAP (TRESIBA) for 2023  Hyperlipidemia:  Goal on Track (progressing): YES. Controlled; current treatment:atorvastatin;  Medications previously tried: n/a  Current dietary patterns: ENCOURAGED HEART HEALTHY to decrease triglycerides/cholesterol Educated on DIET/EXERICISE  ASTHMA -ON SYMBICORT--enrolled in az&me patient assistance program; RE-ENROLLMENT SENT FOR 2023 TO AZ&ME -Uses rescue albuterol occasionally --application submitted to teva cares patient assistance -Uses CPAP at night -Controlled, continue current management  Patient Goals/Self-Care Activities patient will:  - take medications as prescribed as evidenced by patient  report and record review check glucose DAILY/FASTING, document, and provide at future appointments collaborate with provider on medication access solutions target a minimum of 150 minutes of moderate intensity exercise weekly engage in dietary modifications by FOLLOWING HEART HEALTHY DIET   Plan: F/U 3 MONTHS  Subjective: Amber Reeves is an 70 y.o. year old female who is a primary patient of Janora Norlander, DO.  The CCM team was consulted for assistance with disease management and care coordination needs.    Engaged with patient by telephone for follow up visit in response to provider referral for pharmacy case management and/or care coordination services.   Consent to Services:  The patient was given information about Chronic Care Management services, agreed to services, and gave verbal consent prior to initiation of services.  Please see initial visit note for detailed documentation.   Patient Care Team: Janora Norlander, DO as PCP - General (Family Medicine) Harl Bowie, Alphonse Guild, MD as PCP - Cardiology (Cardiology) Cassandria Anger, MD as Consulting Physician (Endocrinology) Lavera Guise, [facility] as Pharmacist (Family Medicine)  Objective:  Lab Results  Component Value Date   CREATININE 1.16 (H) 01/28/2021   CREATININE 1.00 09/30/2020   CREATININE 1.22 (H) 09/16/2020    Lab Results  Component Value Date   HGBA1C 5.8 (H) 01/28/2021   Last diabetic Eye exam:  Lab Results  Component Value Date/Time   HMDIABEYEEXA No Retinopathy 08/25/2020 12:00 AM    Last diabetic Foot exam: No results found for: HMDIABFOOTEX      Component Value Date/Time   CHOL 151 11/12/2019 1520   TRIG 208 (H) 11/12/2019 1520   HDL 45 11/12/2019 1520   CHOLHDL 3.4 11/12/2019 1520   LDLCALC 72 11/12/2019 1520   LDLDIRECT 80 01/28/2021 1309    Hepatic Function Latest Ref Rng & Units 11/12/2019 01/29/2019  Total Protein 6.0 - 8.5 g/dL 6.9 7.2  Albumin 3.8 - 4.8 g/dL 3.8 4.0  AST 0  - 40 IU/L 22 17  ALT 0 - 32 IU/L 23 16  Alk Phosphatase 44 - 121 IU/L 59 67  Total Bilirubin 0.0 - 1.2 mg/dL 0.4 0.6    No results found for: TSH, FREET4  CBC Latest Ref Rng & Units 09/22/2020 09/22/2020 09/22/2020  WBC 4.0 - 10.5 K/uL - - -  Hemoglobin 12.0 - 15.0 g/dL 10.5(L) 10.5(L) 10.5(L)  Hematocrit 36.0 - 46.0 % 31.0(L) 31.0(L) 31.0(L)  Platelets 150 - 400 K/uL - - -    No results found for: VD25OH  Clinical ASCVD: No  The 10-year ASCVD risk score (Arnett DK, et al., 2019) is: 25.5%   Values used to calculate the score:     Age: 57 years     Sex: Female     Is Non-Hispanic African American: No     Diabetic: Yes     Tobacco smoker: No     Systolic Blood Pressure: 665 mmHg     Is BP treated: Yes     HDL Cholesterol: 45 mg/dL     Total Cholesterol: 151 mg/dL    Other: (CHADS2VASc if Afib, PHQ9 if depression, MMRC or CAT for COPD, ACT, DEXA)  Social History   Tobacco Use  Smoking Status Never  Smokeless Tobacco Never   BP Readings from Last 3 Encounters:  01/28/21 139/61  09/30/20 (!) 141/79  09/28/20 (!) 146/64   Pulse Readings from Last 3 Encounters:  01/28/21 (!) 58  09/30/20 (!) 58  09/28/20 (!) 56   Wt Readings from Last 3 Encounters:  01/28/21 228 lb 9.6 oz (103.7 kg)  09/30/20 229 lb 12.8 oz (104.2 kg)  09/28/20 229 lb 9.6 oz (104.1 kg)    Assessment: Review of patient past medical history, allergies, medications, health status, including review of consultants reports, laboratory and other test data, was performed as part of comprehensive evaluation and provision of chronic care management services.   SDOH:  (Social Determinants of Health) assessments and interventions performed:    CCM Care Plan  Allergies  Allergen Reactions   Aspartame Rash   Penicillins Itching and Rash    Did it involve swelling of the face/tongue/throat, SOB, or low BP? No Did it involve sudden or severe rash/hives, skin peeling, or any reaction on the inside of your mouth  or nose? No Did you need to seek medical attention at a hospital or doctor's office? No When did it last happen?~25 years ago       If all above answers are NO, may proceed with cephalosporin use.    Wilder Glade [Dapagliflozin]     UTIs   Metformin And Related     GI distress, dizziness   No [facility]alt[facility] Food Allergies     Honey-Rash Artificial Sweeteners-rash   Paxil [Paroxetine Hcl] Other (See Comments)    Twitching/feels like skin crawling   Prozac [Fluoxetine Hcl] Other (See Comments)    Twitching/feels like skin crawling   Zoloft [Sertraline Hcl] Other (See Comments)    Twitching/feels like skin crawling   Ranexa [Ranolazine] Palpitations    Heart racing    Medications Reviewed Today     Reviewed by Thana Ates, LPN (Licensed Practical Nurse) on 01/28/21 at 69  Med List Status: <None>   Medication Order Taking? Sig Documenting Provider Last Dose Status Informant  albuterol (VENTOLIN HFA) 108 (90 Base) MCG/ACT inhaler [id number] Yes TAKE 2 PUFFS BY MOUTH EVERY 4 HOURS AS NEEDED FOR WHEEZE Janora Norlander, DO Taking Active Self  Alcohol Swabs (B-D SINGLE USE SWABS REGULAR) PADS [id number] Yes Apply topically. [provider] Taking Active Self  amLODipine (NORVASC) 5 MG tablet [id number] Yes Take 1 tablet (5 mg total) by mouth daily. Verta Ellen., NP Taking Active   aspirin EC 81 MG tablet [id number] Yes Take 81 mg by mouth in the morning. [provider] Taking Active Self  atorvastatin (LIPITOR) 40 MG tablet [id number] Yes Take 1 tablet (40 mg total) by mouth at bedtime. Ronnie Doss M, DO Taking Active   Blood Glucose Calibration (ACCU-CHEK AVIVA) SOLN [id number] Yes 2 Bottles by Other route as needed. [provider] Taking Active Self  budesonide-formoterol (SYMBICORT) 160-4.5 MCG/ACT inhaler [id number] Yes Inhale 2 puffs into the lungs 2 (two) times daily. Ronnie Doss M, DO Taking Active Self  Calcium Carb-Cholecalciferol (CALCIUM  600+D3 PO) [id number] Yes Take 1 tablet by mouth in the morning. [provider] Taking Active Self  diphenhydrAMINE (BENADRYL) 25 MG tablet [id number] Yes Take 50 mg by mouth at bedtime. [provider] Taking Active Self  docusate sodium (COLACE) 100 MG capsule [id number] Yes Take 100 mg by mouth 2 (two) times daily as needed (constipation.).  [provider] Taking Active Self  fluticasone (FLONASE) 50 MCG/ACT nasal spray [id number] Yes Place 2 sprays into both nostrils daily. Ronnie Doss M, DO Taking Active   furosemide (LASIX) 20 MG tablet [id number] Yes Take 1 tablet (20 mg total) by mouth as needed (based on weight - may take one extra tab for weight gain of 3lb in 24 hrs or 5lb in 1 week). Verta Ellen., NP Taking Active   glucose blood test strip [id number] Yes TEST BLOOD SUGAR TWICE DAILY [provider] Taking Active Self  ibuprofen (ADVIL) 200 MG tablet [id number] Yes Take 400 mg by mouth daily as needed (pain (leg pain)). [provider] Taking Active Self  insulin degludec (TRESIBA FLEXTOUCH) 200 UNIT/ML FlexTouch Pen [id number] Yes Inject 40 Units into the skin daily.  Patient taking differently: Inject 40 Units into the skin in the morning.   Ronnie Doss M, DO Taking Active   Insulin Syringe-Needle U-100 31G X 5/16" 0.5 ML MISC [id number] Yes Use to inject Insulin four times daily: Use as directed; Dx: E11.9, E66.9 [provider] Taking Active Self  ipratropium-albuterol (DUONEB) 0.5-2.5 (3) MG/3ML SOLN [id number] Yes TAKE 3 ML (1 VIAL) BY NEBULIZATION EVERY 6 HOURS AS NEEDED FOR WHEEZING. Ronnie Doss M, DO Taking Active   isosorbide mononitrate (IMDUR) 60 MG 24 hr tablet [id number] Yes Take 2 tablets (120 mg total) by mouth daily.  Patient taking differently: Take 60 mg by mouth in the morning and at bedtime.   Arnoldo Lenis, MD Taking Active Self  lansoprazole (PREVACID) 15 MG capsule [id number] Yes Take 30 mg by  mouth at bedtime. [provider] Taking Active Self  lisinopril (ZESTRIL) 40 MG tablet [id number] Yes TAKE 1 TABLET EVERY DAY Ronnie Doss M, DO Taking Active   magnesium oxide (MAG-OX) 400 MG tablet [id number] Yes Take magnesium on days you take lasix Ronnie Doss M, DO Taking Active   metoprolol tartrate (LOPRESSOR) 25 MG tablet [id number] Yes Take 1 tablet (25 mg total) by mouth 2 (two) times daily. Ronnie Doss M, DO Taking Active Self  montelukast (SINGULAIR) 10 MG tablet [id number] Yes Take 1 tablet (10 mg total) by mouth at bedtime. Ronnie Doss M, DO Taking Active   nitroGLYCERIN (NITROSTAT) 0.4 MG SL tablet [id number] Yes Place 1 tablet (0.4 mg  total) under the tongue every 5 (five) minutes as needed for chest pain.  Patient taking differently: Place 0.4 mg under the tongue every 5 (five) minutes x 3 doses as needed for chest pain.   Verta Ellen., NP Taking Active Self  pioglitazone (ACTOS) 30 MG tablet [id number] Yes TAKE 1 TABLET BY MOUTH EVERY DAY Ronnie Doss M, DO Taking Active   potassium chloride (KLOR-CON M10) 10 MEQ tablet [id number] Yes TAKE 1 TABLET (10 MEQ TOTAL) BY MOUTH EVERY EVENING. Ronnie Doss M, DO Taking Active   sodium chloride flush (NS) 0.9 % injection 3 mL [id number]   Arnoldo Lenis, MD  Active             Patient Active Problem List   Diagnosis Date Noted   Angina, class III ([place])    DOE (dyspnea on exertion)    CAD S/P percutaneous coronary angioplasty    Hyperlipidemia associated with type 2 diabetes mellitus ([place]) 11/29/2019   Hypertension associated with diabetes ([place]) 11/29/2019   DM type 2 causing vascular disease ([place]) 05/20/2019   Essential hypertension, benign 05/20/2019   Mixed hyperlipidemia 05/20/2019   Coronary artery disease involving native coronary artery of native heart with angina pectoris ([place]) 01/29/2019   Stented coronary artery 02/15/2018   Dizziness 11/20/2017   Stable angina ([place])  11/19/2017   Bipolar affective disorder ([place]) 08/31/2016   OSA on CPAP 08/31/2016   Morbid obesity due to excess calories ([place]) 01/07/2016   Left sided colitis with rectal bleeding ([place]) 07/15/2015   Environmental allergies 06/02/2014   Moderate persistent asthma without complication 24/10/7351   Depressed 05/01/2013   GERD (gastroesophageal reflux disease) 04/15/2011   History of total knee replacement 04/15/2011    Immunization History  Administered Date(s) Administered   Influenza, High Dose Seasonal PF 02/16/2017   Influenza,inj,Quad PF,6+ Mos 12/18/2013   Moderna Sars-Covid-2 Vaccination 05/01/2019, 05/29/2019, 02/18/2020    Conditions to be addressed/monitored: HLD, DMII, and Asthma  Care Plan : PHARMD MEDICATION MANAGEMENT  Updates made by Lavera Guise, [place] since 02/11/2021 12:00 AM     Problem: DISEASE PROGRESSION PREVENTION      Long-Range Goal: T2DM, ASTHMA, HLD   Recent Progress: On track  Priority: High  Note:   Current Barriers:  Unable to independently afford treatment regimen Unable to maintain control of T2DM Suboptimal therapeutic regimen for T2DM  Pharmacist Clinical Goal(s):  patient will verbalize ability to afford treatment regimen maintain control of T2DM, HLD as evidenced by GOAL A1C, MORE EFFICACIOUS REGIMEN  through collaboration with PharmD and provider.   Interventions: 1:1 collaboration with Janora Norlander, DO regarding development and update of comprehensive plan of care as evidenced by provider attestation and co-signature Inter-disciplinary care team collaboration (see longitudinal plan of care) Comprehensive medication review performed; medication list updated in electronic medical record  Diabetes: Goal on Track (progressing): YES. Controlled-A1C 5.8%; current treatment: TRESIBA, PIOGLITAZONE;  Would like to optimize regimen to provide CV, kidney benefit, however patient intolerant list makes this challenging Considering GLP1  therapy to d/c pioglitazone CONTINUE TRESIBA-->SAMPLES PROVIDED TO [place] TO PATIENT ASSISTANCE SUPPLY Current glucose readings: fasting glucose: n/a' post prandial glucose: n/a Patient not testing at home Recommended patient test--we can likely d/c pioglitazone  Concerned for hypoglycemia Denies hypoglycemic/hyperglycemic symptoms Discussed meal planning options and Plate method for healthy eating Avoid sugary drinks and desserts Incorporate balanced protein, non starchy veggies, 1 serving of carbohydrate with each meal Increase water intake Increase physical activity as able Current exercise:  encouraged Educated on diet, medications Assessed patient finances. Will enroll in novo nordisk PAP (TRESIBA) VS [employer]ES PAP ([place]AG[place]AR) FOR INSULIN--WILL NEED PATIENT FINANCIALS FOR NOVO NORDISK  Hyperlipidemia:  Goal on Track (progressing): YES. Controlled; current treatment:atorvastatin;  Medications previously tried: n/a  Current dietary patterns: ENCOURAGED HEART HEALTHY to decrease triglycerides/cholesterol Educated on DIET/EXERICISE  ASTHMA -ON SYMBICORT--enrolled in az&me patient assistance program; RE-ENROLLMENT SENT FOR 2023 TO AZ&ME -Uses rescue albuterol occasionally --application submitted to teva cares patient assistance -Uses CPAP at night -Controlled, continue current management  Patient Goals/Self-Care Activities patient will:  - take medications as prescribed as evidenced by patient report and record review check glucose DAILY/FASTING, document, and provide at future appointments collaborate with provider on medication access solutions target a minimum of 150 minutes of moderate intensity exercise weekly engage in dietary modifications by FOLLOWING HEART HEALTHY DIET      Medication Assistance:  symbicort-az*me patient assistance; tresiba via novo nordisk; applying for teva cares PAP for PROAIR--pending  Patient's preferred pharmacy is:  [id number]  [address] [address]Phone: [phone]: [address] [address] [place]Phone: [phone]: 8[place] - [place] [address]Phone: [phone]: [phone]   Follow Up:  Patient agrees to Care Plan and Follow-up.  Plan: Telephone follow up appointment with care management team member scheduled for:  3 months    JRegina Eck PharmD, BCPS Clinical Pharmacist, [place] Phone [phone]

## 2021-02-12 ENCOUNTER — Other Ambulatory Visit: Payer: Self-pay | Admitting: Cardiology

## 2021-02-13 ENCOUNTER — Other Ambulatory Visit: Payer: Self-pay | Admitting: Family Medicine

## 2021-02-16 ENCOUNTER — Other Ambulatory Visit: Payer: Self-pay | Admitting: Family Medicine

## 2021-02-19 ENCOUNTER — Other Ambulatory Visit (HOSPITAL_COMMUNITY): Payer: Self-pay

## 2021-02-20 DIAGNOSIS — J4540 Moderate persistent asthma, uncomplicated: Secondary | ICD-10-CM

## 2021-02-20 DIAGNOSIS — E1159 Type 2 diabetes mellitus with other circulatory complications: Secondary | ICD-10-CM

## 2021-03-05 ENCOUNTER — Telehealth: Payer: Self-pay | Admitting: Family Medicine

## 2021-03-05 NOTE — Telephone Encounter (Signed)
Pt needs to talk to [name] about her inhaler being discontinued. Please call back.

## 2021-03-05 NOTE — Telephone Encounter (Signed)
Discussed teva patient assistance denial with patient (proair had been discontinued from the market; no longer available)  She will continue to get from [place]  Encouraged patient to make appt with PCP or seek emergency help if difficulty with breathing/shortness of breath

## 2021-03-15 ENCOUNTER — Telehealth: Payer: Self-pay | Admitting: Cardiology

## 2021-03-15 ENCOUNTER — Telehealth: Payer: Self-pay | Admitting: Family Medicine

## 2021-03-15 MED ORDER — AMLODIPINE BESYLATE 10 MG PO TABS: 10.0000 mg | ORAL_TABLET | Freq: Every day | ORAL | 3 refills | Status: DC

## 2021-03-15 NOTE — Telephone Encounter (Signed)
Pt c/o of Chest Pain: STAT if CP now or developed within 24 hours  1. Are you having CP right now? Yes  2. Are you experiencing any other symptoms (ex. SOB, nausea, vomiting, sweating)? SOB when moving and sitting, lightheaded  3. How long have you been experiencing CP? Couple days  4. Is your CP continuous or coming and going? Comes and goes  5. Have you taken Nitroglycerin? No, not sure when to take ?   Patient she states she has been having an aching pain in her chest for a couple day. She says she also get SOB and lightheadedness.

## 2021-03-15 NOTE — Telephone Encounter (Signed)
Dr.Branch recommends increasing Norvasc 10 mg daily. Patient has appointment on Wednesday 03/17/21 at [place] with [place] in the [place] office.

## 2021-03-15 NOTE — Telephone Encounter (Signed)
Patient states she has had intermittent CP, SOB for past several weeks. By last office visit it appears this is chronic for patient. She states that this weekend her CP was intermittent all day,lasting 5 minutes each episode in center of chest. No current CP. She has used her inhalers several times with no relief.She used to follow pulmonary when she lived in [place] but has not seen anyone in 2 years-pcp prescribes inhalers/neb.She stopped using CPAP, states it gave her pneumonia.  She does not weigh at home-unsure where scale is,does not check pulse ox or BP.Says her  weight was 230 lbs in December with pcp.   She does not use NTG-states she didn't know how to take it. I explained directions and had her take one now to which she reported no change in SOB.   She does not take lasix either.She said she noticed her toes looked "a little blue" this weekend.  Her Imdur dose is 120 mg BID (not 60 mg qd as indicated in med list)   I asked her to find BP cuff and pulse ox now, 152/73,HR 67, SaO2 96%   I recommended she speak with pcp and I would also message Dr.Branch

## 2021-03-15 NOTE — Telephone Encounter (Signed)
Patient called in complaining with chest pain and sob. She has contacted her cardiologist as well. Patient was advised to go to the Emergency Room due to her having chest pain and the nitroglycerin that the cardio's office advised her to take has not helped.

## 2021-03-17 ENCOUNTER — Encounter: Payer: Self-pay | Admitting: Cardiology

## 2021-03-17 ENCOUNTER — Ambulatory Visit: Payer: Medicare HMO | Admitting: Cardiology

## 2021-03-17 VITALS — BP 120/70 | HR 58 | Ht 63.0 in | Wt 226.6 lb

## 2021-03-17 DIAGNOSIS — I25119 Atherosclerotic heart disease of native coronary artery with unspecified angina pectoris: Secondary | ICD-10-CM | POA: Diagnosis not present

## 2021-03-17 DIAGNOSIS — G4733 Obstructive sleep apnea (adult) (pediatric): Secondary | ICD-10-CM | POA: Diagnosis not present

## 2021-03-17 DIAGNOSIS — J449 Chronic obstructive pulmonary disease, unspecified: Secondary | ICD-10-CM

## 2021-03-17 DIAGNOSIS — Z9989 Dependence on other enabling machines and devices: Secondary | ICD-10-CM

## 2021-03-17 MED ORDER — ISOSORBIDE MONONITRATE ER 60 MG PO TB24: 60.0000 mg | ORAL_TABLET | Freq: Two times a day (BID) | ORAL | 2 refills | Status: DC

## 2021-03-17 MED ORDER — PREDNISONE 20 MG PO TABS: 40.0000 mg | ORAL_TABLET | Freq: Every day | ORAL | 0 refills | Status: AC

## 2021-03-17 NOTE — Patient Instructions (Addendum)
Medication Instructions:  Your physician has recommended you make the following change in your medication:  Start prednisone 40 mg daily for 5 days Continue other medications the same  Labwork: none  Testing/Procedures: none  Follow-Up: Your physician recommends that you schedule a follow-up appointment in: 6 months  Any Other Special Instructions Will Be Listed Below (If Applicable). You have been referred to [facility] Pulmonology  If you need a refill on your cardiac medications before your next appointment, please call your pharmacy.

## 2021-03-17 NOTE — Progress Notes (Signed)
Clinical Summary Amber Reeves is a 71 y.o.female seen today for follow up of the following medical problems.    1. CAD -11/2017 cath at [facility] showed >70% mid LAD, otherwise patent vessels. She presented with chest pain, not an MI - received DES to LAD - 11/2017 echo  LVEF Complete echocardiogram shows a left ventricular EF by visual approximation 60 to 65%. Normal left ventricular systolic function. Grade 1 left ventricular diastolic function. No significant valvular disease. No pericardial effusion   01/2018 stress test: THR not achieved, exercise limiting angina , duke treadmill score neg 1   - she reports prior false negative stress tests in the past.  - 09/04/18 nuclear stress by prior cardiollogist showed anterior breast artifact, probably normal,   Jan 2021 cath: mild nonobstructive disease.    -06/2020 echo LVEF 65-70%, grade II dd, - lasix changed to 85m daily  09/2020 RHC/LHC: LM patent, patent LAD stent, ramus patent, LCX patent, RCA 20%. Several tiny diagonals are jailed by stent, to small for intervetion Mean PA 21 PCWP 16 CI 4.08 LVEDP 18   -recent symptoms started Sunday. Could be sharp or nagging, midchest. At rest and with exertion. 2-3/10 in severity. Some associated SOB. Worst with deep breathing. Lasted 5-10 minutes, went ways on its own.  "Weird feeling" during that episode, felt like she may pass out. Felt lightheaded. No specific palpitations. Sat down and started to feel better.  - had some cough, slight wheeze. Nonprodcutive. No fevers or chills. Had been using nebulzier/inhaler leading up to episode some improvement. Overall increased use nebulizer  -on Monday recurrnt pain. Tootache like pain midchest, 2/10 in severity. Some improvement in symptoms after 20 minutes. Pain lasted few minutes, off and on throught the day. Somewhat different from prior chest pains.  Tuesday felt well overall.  - we increased norvasc to 136mdaily      2. HTN - renal  artery angio at time of 11/2017 cath showed no significant renal artery disease       3. Hyperlipidemia - she is on a statin     4. OSA on cpap - she attributes getting pneumonia when on cpap, she stopped wearing. - does not want to wear cpap       5. Chronic diastolic HF - no recent edema       Past Medical History:  Diagnosis Date   Allergy    Anxiety    Arthritis    Asthma    Depression    Diabetes mellitus without complication (HCC)    GERD (gastroesophageal reflux disease)    Hyperlipidemia    Hypertension      Allergies  Allergen Reactions   Aspartame Rash   Penicillins Itching and Rash    Did it involve swelling of the face/tongue/throat, SOB, or low BP? No Did it involve sudden or severe rash/hives, skin peeling, or any reaction on the inside of your mouth or nose? No Did you need to seek medical attention at a hospital or doctor's office? No When did it last happen?~25 years ago       If all above answers are NO, may proceed with cephalosporin use.    FaWilder GladeDapagliflozin]     UTIs   Metformin And Related     GI distress, dizziness   No Healthtouch Food Allergies     Honey-Rash Artificial Sweeteners-rash   Paxil [Paroxetine Hcl] Other (See Comments)    Twitching/feels like skin crawling   Prozac [Fluoxetine  Hcl] Other (See Comments)    Twitching/feels like skin crawling   Zoloft [Sertraline Hcl] Other (See Comments)    Twitching/feels like skin crawling   Ranexa [Ranolazine] Palpitations    Heart racing     Current Outpatient Medications  Medication Sig Dispense Refill   albuterol (VENTOLIN HFA) 108 (90 Base) MCG/ACT inhaler TAKE 2 PUFFS BY MOUTH EVERY 4 HOURS AS NEEDED FOR WHEEZE 18 g 3   Alcohol Swabs (B-D SINGLE USE SWABS REGULAR) PADS Apply topically.     amLODipine (NORVASC) 10 MG tablet Take 1 tablet (10 mg total) by mouth daily. 90 tablet 3   aspirin EC 81 MG tablet Take 81 mg by mouth in the morning.     atorvastatin (LIPITOR) 40  MG tablet Take 1 tablet (40 mg total) by mouth at bedtime. 90 tablet 3   Blood Glucose Calibration (ACCU-CHEK AVIVA) SOLN 2 Bottles by Other route as needed.     budesonide-formoterol (SYMBICORT) 160-4.5 MCG/ACT inhaler Inhale 2 puffs into the lungs 2 (two) times daily. 3 each 11   Calcium Carb-Cholecalciferol (CALCIUM 600+D3 PO) Take 1 tablet by mouth in the morning.     diphenhydrAMINE (BENADRYL) 25 MG tablet Take 50 mg by mouth at bedtime.     docusate sodium (COLACE) 100 MG capsule Take 100 mg by mouth 2 (two) times daily as needed (constipation.).      fluticasone (FLONASE) 50 MCG/ACT nasal spray Place 2 sprays into both nostrils daily. 48 g 3   furosemide (LASIX) 20 MG tablet Take 1 tablet (20 mg total) by mouth as needed (based on weight - may take one extra tab for weight gain of 3lb in 24 hrs or 5lb in 1 week).     glucose blood test strip TEST BLOOD SUGAR TWICE DAILY     ibuprofen (ADVIL) 200 MG tablet Take 400 mg by mouth daily as needed (pain (leg pain)).     insulin degludec (TRESIBA FLEXTOUCH) 200 UNIT/ML FlexTouch Pen Inject 40 Units into the skin daily. (Patient taking differently: Inject 38 Units into the skin in the morning.) 18 mL 0   Insulin Syringe-Needle U-100 31G X 5/16" 0.5 ML MISC Use to inject Insulin four times daily: Use as directed; Dx: E11.9, E66.9     ipratropium-albuterol (DUONEB) 0.5-2.5 (3) MG/3ML SOLN TAKE 3 ML (1 VIAL) BY NEBULIZATION EVERY 6 HOURS AS NEEDED FOR WHEEZING. 360 mL 0   isosorbide mononitrate (IMDUR) 60 MG 24 hr tablet TAKE 1 TABLET BY MOUTH EVERY DAY 90 tablet 3   lansoprazole (PREVACID) 15 MG capsule Take 30 mg by mouth at bedtime.     lisinopril (ZESTRIL) 40 MG tablet TAKE 1 TABLET EVERY DAY 90 tablet 0   magnesium oxide (MAG-OX) 400 MG tablet Take magnesium on days you take lasix 60 tablet 3   metoprolol tartrate (LOPRESSOR) 25 MG tablet Take 1 tablet (25 mg total) by mouth 2 (two) times daily. 180 tablet 3   montelukast (SINGULAIR) 10 MG tablet  Take 1 tablet (10 mg total) by mouth at bedtime. 90 tablet 3   nitroGLYCERIN (NITROSTAT) 0.4 MG SL tablet Place 1 tablet (0.4 mg total) under the tongue every 5 (five) minutes as needed for chest pain. (Patient taking differently: Place 0.4 mg under the tongue every 5 (five) minutes x 3 doses as needed for chest pain.) 25 tablet 3   pioglitazone (ACTOS) 30 MG tablet TAKE 1 TABLET BY MOUTH EVERY DAY 90 tablet 0   potassium chloride (KLOR-CON M10) 10 MEQ  tablet TAKE 1 TABLET (10 MEQ TOTAL) BY MOUTH EVERY EVENING. 90 tablet 0   Current Facility-Administered Medications  Medication Dose Route Frequency Provider Last Rate Last Admin   sodium chloride flush (NS) 0.9 % injection 3 mL  3 mL Intravenous Q12H , Alphonse Guild, MD         Past Surgical History:  Procedure Laterality Date   ABDOMINAL HYSTERECTOMY     BREAST EXCISIONAL BIOPSY     CESAREAN SECTION     3   CHOLECYSTECTOMY  2018   CORONARY STENT PLACEMENT  10/2017   [facility] hospital)   LEFT HEART CATH AND CORONARY ANGIOGRAPHY N/A 02/25/2019   Procedure: LEFT HEART CATH AND CORONARY ANGIOGRAPHY;  Surgeon: Nelva Bush, MD;  Location: [place] [facility];  Service: Cardiovascular;  Laterality: N/A;   REPLACEMENT TOTAL KNEE BILATERAL Bilateral    done in [place] (2003/2005)   RIGHT/LEFT HEART CATH AND CORONARY ANGIOGRAPHY N/A 09/22/2020   Procedure: RIGHT/LEFT HEART CATH AND CORONARY ANGIOGRAPHY;  Surgeon: Leonie Man, MD;  Location: [place] [facility];  Service: Cardiovascular;  Laterality: N/A;     Allergies  Allergen Reactions   Aspartame Rash   Penicillins Itching and Rash    Did it involve swelling of the face/tongue/throat, SOB, or low BP? No Did it involve sudden or severe rash/hives, skin peeling, or any reaction on the inside of your mouth or nose? No Did you need to seek medical attention at a hospital or doctor's office? No When did it last happen?~25 years ago       If all above answers are NO,  may proceed with cephalosporin use.    Wilder Glade [Dapagliflozin]     UTIs   Metformin And Related     GI distress, dizziness   No Healthtouch Food Allergies     Honey-Rash Artificial Sweeteners-rash   Paxil [Paroxetine Hcl] Other (See Comments)    Twitching/feels like skin crawling   Prozac [Fluoxetine Hcl] Other (See Comments)    Twitching/feels like skin crawling   Zoloft [Sertraline Hcl] Other (See Comments)    Twitching/feels like skin crawling   Ranexa [Ranolazine] Palpitations    Heart racing      Family History  Problem Relation Age of Onset   Heart disease Mother    Stroke Mother    Hypertension Mother    Cancer Father    Stroke Father      Social History Ms. Yeoman reports that she has never smoked. She has never used smokeless tobacco. Ms. Buehring reports no history of alcohol use.   Review of Systems CONSTITUTIONAL: No weight loss, fever, chills, weakness or fatigue.  HEENT: Eyes: No visual loss, blurred vision, double vision or yellow sclerae.No hearing loss, sneezing, congestion, runny nose or sore throat.  SKIN: No rash or itching.  CARDIOVASCULAR: per hpi RESPIRATORY: No shortness of breath, cough or sputum.  GASTROINTESTINAL: No anorexia, nausea, vomiting or diarrhea. No abdominal pain or blood.  GENITOURINARY: No burning on urination, no polyuria NEUROLOGICAL: No headache, dizziness, syncope, paralysis, ataxia, numbness or tingling in the extremities. No change in bowel or bladder control.  MUSCULOSKELETAL: No muscle, back pain, joint pain or stiffness.  LYMPHATICS: No enlarged nodes. No history of splenectomy.  PSYCHIATRIC: No history of depression or anxiety.  ENDOCRINOLOGIC: No reports of sweating, cold or heat intolerance. No polyuria or polydipsia.  Marland Kitchen   Physical Examination Today's Vitals   03/17/21 0810  BP: 120/70  Pulse: (!) 58  SpO2: 98%  Weight: 226 lb 9.6 oz (102.8 kg)  Height: 5' 3"  (1.6 m)   Body mass index is 40.14  kg/m.  Gen: resting comfortably, no acute distress HEENT: no scleral icterus, pupils equal round and reactive, no palptable cervical adenopathy,  CV: RRR, no m/r/g no jvd Resp: Clear to auscultation bilaterally GI: abdomen is soft, non-tender, non-distended, normal bowel sounds, no hepatosplenomegaly MSK: extremities are warm, no edema.  Skin: warm, no rash Neuro:  no focal deficits Psych: appropriate affect   Diagnostic Studies  11/2017 cath [facility] Severe >70% long mid LAD stenosis No other significant angiographic stenosis in the other coronary arteries  DES to mid LAD   Malignant hypertension - selective renal angio without any evidence of  renal artery stenosis.   Renal angio procedure:  A Tiger 4 catheter was used to selectively engage both the left and right  renal arteries and was used to perform selective angiography.    Renal artery anatomy:  The left kidney is supplied by a single renal artery which has no  angiographic stenosis  The right kidney is supplied by a single rena artery which has no  angiographic stenosis     Jan 2021 Mild, non-obstructive coronary artery disease. Widely patent proximal/mid LAD stent. Normal left ventricular systolic function with upper normal to mildly elevated filling pressure (LVEDP ~15 mmHg).   06/2020 echo  1. Left ventricular ejection fraction, by estimation, is 65 to 70%. The  left ventricle has normal function. Left ventricular endocardial border  not optimally defined to evaluate regional wall motion. There is mild left  ventricular hypertrophy. Left  ventricular diastolic parameters are consistent with Grade II diastolic  dysfunction (pseudonormalization). Normal global longitudinal strain of  -19.8%.   2. Right ventricular systolic function is normal. The right ventricular  size is normal. There is normal pulmonary artery systolic pressure. The  estimated right ventricular systolic pressure is 94.1 mmHg.    3. Left atrial size was mildly dilated.   4. Right atrial size was mildly dilated.   5. The mitral valve is grossly normal. Trivial mitral valve  regurgitation.   6. The aortic valve has an indeterminant number of cusps. Aortic valve  regurgitation is not visualized.   7. The inferior vena cava is normal in size with greater than 50%  respiratory variability, suggesting right atrial pressure of 3 mmHg.   09/2020 RHC/LHC: LM patent, patent LAD stent, ramus patent, LCX patent, RCA 20% Mean PA 21 PCWP 16 CI 4.08    Assessment and Plan  1. CAD with other forms of angina.  - history of prior stent to LAD - has had chronic intermittent chet pains.  - Jan 2021 and Aug 2022 caths without significant disease.  - recent chest pain symptoms not overally consistent with ischemia. WIth increased nebulizer and inhaler use perhaps related to her lung diseaes, given 5 day course of prendisone. We also had increased her norvasc for additional antiangional effects. No plans on repeat ischemic testing at this time.   EKG today shows SR, no acute ischemic changes    2. Chronic diastolic HF - euvoelmic today, continue curren tmeds   3. OSA - refer to pulmonary  4. History of COPD - refer to pulmonary   Arnoldo Lenis, M.D.

## 2021-03-31 ENCOUNTER — Ambulatory Visit: Payer: Medicare HMO | Admitting: Cardiology

## 2021-03-31 ENCOUNTER — Other Ambulatory Visit: Payer: Self-pay | Admitting: Family Medicine

## 2021-03-31 DIAGNOSIS — I152 Hypertension secondary to endocrine disorders: Secondary | ICD-10-CM

## 2021-04-02 ENCOUNTER — Telehealth: Payer: Self-pay

## 2021-04-02 NOTE — Telephone Encounter (Signed)
We received patient's [place] and needles.  Patient was informed and will pick up.  Medication and needles placed in refrigerator in lab.

## 2021-04-12 ENCOUNTER — Other Ambulatory Visit: Payer: Self-pay | Admitting: Family Medicine

## 2021-04-12 DIAGNOSIS — E1165 Type 2 diabetes mellitus with hyperglycemia: Secondary | ICD-10-CM

## 2021-04-18 ENCOUNTER — Other Ambulatory Visit: Payer: Self-pay | Admitting: Family Medicine

## 2021-05-24 ENCOUNTER — Encounter: Payer: Self-pay | Admitting: Family Medicine

## 2021-05-24 ENCOUNTER — Ambulatory Visit (INDEPENDENT_AMBULATORY_CARE_PROVIDER_SITE_OTHER): Payer: Medicare HMO | Admitting: Family Medicine

## 2021-05-24 VITALS — BP 136/73 | HR 60 | Temp 97.5°F | Ht 63.0 in | Wt 236.6 lb

## 2021-05-24 DIAGNOSIS — E1169 Type 2 diabetes mellitus with other specified complication: Secondary | ICD-10-CM

## 2021-05-24 DIAGNOSIS — G4733 Obstructive sleep apnea (adult) (pediatric): Secondary | ICD-10-CM

## 2021-05-24 DIAGNOSIS — I152 Hypertension secondary to endocrine disorders: Secondary | ICD-10-CM

## 2021-05-24 DIAGNOSIS — Z794 Long term (current) use of insulin: Secondary | ICD-10-CM | POA: Diagnosis not present

## 2021-05-24 DIAGNOSIS — E785 Hyperlipidemia, unspecified: Secondary | ICD-10-CM

## 2021-05-24 DIAGNOSIS — I25119 Atherosclerotic heart disease of native coronary artery with unspecified angina pectoris: Secondary | ICD-10-CM

## 2021-05-24 DIAGNOSIS — R5381 Other malaise: Secondary | ICD-10-CM | POA: Diagnosis not present

## 2021-05-24 DIAGNOSIS — F418 Other specified anxiety disorders: Secondary | ICD-10-CM

## 2021-05-24 DIAGNOSIS — E1159 Type 2 diabetes mellitus with other circulatory complications: Secondary | ICD-10-CM

## 2021-05-24 LAB — BAYER DCA HB A1C WAIVED: HB A1C (BAYER DCA - WAIVED): 5.8 % — ABNORMAL HIGH (ref 4.8–5.6)

## 2021-05-24 NOTE — Progress Notes (Signed)
? ?Subjective: ?CC:Dm ?PCP: Janora Norlander, DO ?FYB:OFBPZ Amber Reeves is a 71 y.o. female presenting to clinic today for: ? ?1. Type 2 Diabetes with hypertension, hyperlipidemia and CAD:  ?Patient reports compliance with all medications.  She is currently injecting 38 units of her [place] Aas and continues to take her Actos 30 mg daily.  Her Norvasc was increased to 10 mg and she is now taking isosorbide twice daily.  She been having some breakthrough chest pain and was seen by cardiology in January.  She has subsequently been referred to pulmonology for some shortness of breath that she has been experiencing but does not appear to be cardiac related.  She has been experiencing some intermittent lightheadedness and dizziness and she does note that when she checks her blood pressure it is on the lower side of normal.  She has yet to check her blood sugar during these events and in fact has not been really checking her blood sugar for the last several months as her monitor is packed away somewhere.  She has chronic headaches that have been evaluated by neurology in the past and is diagnosed with obstructive sleep apnea but does not utilize her CPAP due to intolerance and having associated it with recurrent pulmonary infections during the first year of use.  Apparently her particular CPAP was later recalled due to cancer associations as well ? ?Last eye exam: Due in July ?Last foot exam: Due in August ?Last A1c:  ?Lab Results  ?Component Value Date  ? HGBA1C 5.8 (H) 05/24/2021  ? ?Nephropathy screen indicated?:  On ACE inhibitor ?Last flu, zoster and/or pneumovax:  ?Immunization History  ?Administered Date(s) Administered  ? Influenza, High Dose Seasonal PF 02/16/2017  ? Influenza,inj,Quad PF,6+ Mos 12/18/2013  ? Moderna Sars-Covid-2 Vaccination 05/01/2019, 05/29/2019, 02/18/2020  ? ?2.  Stress ?Patient reports that she does experience stress, particularly related to her health.  She feels overwhelmed by the number  of medications that she has to take and is frustrated by the fact that when she has discontinued medications in the past she did it with 3 times as many medications.  This is somewhat deflating ? ?ROS: Per HPI ? ?Allergies  ?Allergen Reactions  ? Aspartame Rash  ? Penicillins Itching and Rash  ?  Did it involve swelling of the face/tongue/throat, SOB, or low BP? No ?Did it involve sudden or severe rash/hives, skin peeling, or any reaction on the inside of your mouth or nose? No ?Did you need to seek medical attention at a hospital or doctor's office? No ?When did it last happen?~25 years ago       ?If all above answers are ?NO?, may proceed with cephalosporin use. ?  ? [place] [Dapagliflozin]   ?  UTIs  ? Metformin And Related   ?  GI distress, dizziness  ? No Healthtouch Food Allergies   ?  Honey-Rash ?Artificial Sweeteners-rash  ? Paxil [Paroxetine Hcl] Other (See Comments)  ?  Twitching/feels like skin crawling  ? Prozac [Fluoxetine Hcl] Other (See Comments)  ?  Twitching/feels like skin crawling  ? Zoloft [Sertraline Hcl] Other (See Comments)  ?  Twitching/feels like skin crawling  ? Ranexa [Ranolazine] Palpitations  ?  Heart racing  ? ?Past Medical History:  ?Diagnosis Date  ? Allergy   ? Anxiety   ? Arthritis   ? Asthma   ? Depression   ? Diabetes mellitus without complication ([place])   ? GERD (gastroesophageal reflux disease)   ? Hyperlipidemia   ?  Hypertension   ? ? ?Current Outpatient Medications:  ?  albuterol (VENTOLIN HFA) 108 (90 Base) MCG/ACT inhaler, TAKE 2 PUFFS BY MOUTH EVERY 4 HOURS AS NEEDED FOR WHEEZE, Disp: 18 g, Rfl: 3 ?  Alcohol Swabs (B-D SINGLE USE SWABS REGULAR) PADS, Apply topically., Disp: , Rfl:  ?  amLODipine (NORVASC) 10 MG tablet, Take 1 tablet (10 mg total) by mouth daily., Disp: 90 tablet, Rfl: 3 ?  aspirin EC 81 MG tablet, Take 81 mg by mouth in the morning., Disp: , Rfl:  ?  atorvastatin (LIPITOR) 40 MG tablet, Take 1 tablet (40 mg total) by mouth at bedtime., Disp: 90 tablet, Rfl:  3 ?  Blood Glucose Calibration (ACCU-CHEK AVIVA) SOLN, 2 Bottles by Other route as needed., Disp: , Rfl:  ?  budesonide-formoterol (SYMBICORT) 160-4.5 MCG/ACT inhaler, Inhale 2 puffs into the lungs 2 (two) times daily., Disp: 3 each, Rfl: 11 ?  Calcium Carb-Cholecalciferol (CALCIUM 600+D3 PO), Take 1 tablet by mouth in the morning., Disp: , Rfl:  ?  diphenhydrAMINE (BENADRYL) 25 MG tablet, Take 50 mg by mouth at bedtime., Disp: , Rfl:  ?  docusate sodium (COLACE) 100 MG capsule, Take 100 mg by mouth 2 (two) times daily as needed (constipation.). , Disp: , Rfl:  ?  fluticasone (FLONASE) 50 MCG/ACT nasal spray, Place 2 sprays into both nostrils daily., Disp: 48 g, Rfl: 3 ?  furosemide (LASIX) 20 MG tablet, Take 1 tablet (20 mg total) by mouth as needed (based on weight - may take one extra tab for weight gain of 3lb in 24 hrs or 5lb in 1 week)., Disp: , Rfl:  ?  glucose blood test strip, TEST BLOOD SUGAR TWICE DAILY, Disp: , Rfl:  ?  ibuprofen (ADVIL) 200 MG tablet, Take 400 mg by mouth daily as needed (pain (leg pain))., Disp: , Rfl:  ?  insulin degludec (TRESIBA FLEXTOUCH) 200 UNIT/ML FlexTouch Pen, Inject 40 Units into the skin daily. (Patient taking differently: Inject 38 Units into the skin in the morning.), Disp: 18 mL, Rfl: 0 ?  Insulin Syringe-Needle U-100 31G X 5/16" 0.5 ML MISC, Use to inject Insulin four times daily: Use as directed; Dx: E11.9, E66.9, Disp: , Rfl:  ?  ipratropium-albuterol (DUONEB) 0.5-2.5 (3) MG/3ML SOLN, TAKE 3 ML (1 VIAL) BY NEBULIZATION EVERY 6 HOURS AS NEEDED FOR WHEEZING., Disp: 360 mL, Rfl: 0 ?  isosorbide mononitrate (IMDUR) 60 MG 24 hr tablet, Take 1 tablet (60 mg total) by mouth 2 (two) times daily., Disp: 180 tablet, Rfl: 2 ?  lansoprazole (PREVACID) 15 MG capsule, Take 30 mg by mouth at bedtime., Disp: , Rfl:  ?  lisinopril (ZESTRIL) 40 MG tablet, TAKE 1 TABLET EVERY DAY, Disp: 90 tablet, Rfl: 0 ?  magnesium oxide (MAG-OX) 400 MG tablet, Take magnesium on days you take lasix,  Disp: 60 tablet, Rfl: 3 ?  metoprolol tartrate (LOPRESSOR) 25 MG tablet, Take 1 tablet (25 mg total) by mouth 2 (two) times daily., Disp: 180 tablet, Rfl: 3 ?  montelukast (SINGULAIR) 10 MG tablet, Take 1 tablet (10 mg total) by mouth at bedtime., Disp: 90 tablet, Rfl: 3 ?  nitroGLYCERIN (NITROSTAT) 0.4 MG SL tablet, Place 1 tablet (0.4 mg total) under the tongue every 5 (five) minutes as needed for chest pain. (Patient taking differently: Place 0.4 mg under the tongue every 5 (five) minutes x 3 doses as needed for chest pain.), Disp: 25 tablet, Rfl: 3 ?  pioglitazone (ACTOS) 30 MG tablet, TAKE 1 TABLET BY  MOUTH EVERY DAY, Disp: 90 tablet, Rfl: 0 ?  potassium chloride (KLOR-CON M10) 10 MEQ tablet, TAKE 1 TABLET (10 MEQ TOTAL) BY MOUTH EVERY EVENING., Disp: 90 tablet, Rfl: 0 ? ?Current Facility-Administered Medications:  ?  sodium chloride flush (NS) 0.9 % injection 3 mL, 3 mL, Intravenous, Q12H, [place], Alphonse Guild, MD ?Social History  ? ?Socioeconomic History  ? Marital status: Married  ?  Spouse name: Not on file  ? Number of children: Not on file  ? Years of education: Not on file  ? Highest education level: Not on file  ?Occupational History  ? Not on file  ?Tobacco Use  ? Smoking status: Never  ? Smokeless tobacco: Never  ?Vaping Use  ? Vaping Use: Never used  ?Substance and Sexual Activity  ? Alcohol use: Never  ? Drug use: Never  ? Sexual activity: Not Currently  ?Other Topics Concern  ? Not on file  ?Social History Narrative  ? Resides at home with her husband.  She is originally from [place] but relocated to [place] and then to [place] in August 2020.  She has a son who lives about 2 hours away in [place] and a sister-in-law who lives in [place].  ? ?Social Determinants of Health  ? ?Financial Resource Strain: Low Risk   ? Difficulty of Paying Living Expenses: Not hard at all  ?Food Insecurity: No Food Insecurity  ? Worried About Charity fundraiser in the Last Year: Never true  ? Ran Out of Food in  the Last Year: Never true  ?Transportation Needs: No Transportation Needs  ? Lack of Transportation (Medical): No  ? Lack of Transportation (Non-Medical): No  ?Physical Activity: Inactive  ? Days of Exer

## 2021-06-03 ENCOUNTER — Telehealth: Payer: Self-pay | Admitting: Family Medicine

## 2021-06-03 DIAGNOSIS — I25119 Atherosclerotic heart disease of native coronary artery with unspecified angina pectoris: Secondary | ICD-10-CM

## 2021-06-03 DIAGNOSIS — E1159 Type 2 diabetes mellitus with other circulatory complications: Secondary | ICD-10-CM

## 2021-06-03 MED ORDER — METOPROLOL TARTRATE 25 MG PO TABS: 25.0000 mg | ORAL_TABLET | Freq: Two times a day (BID) | ORAL | 1 refills | Status: DC

## 2021-06-03 NOTE — Telephone Encounter (Signed)
?  Prescription Request ? ?06/03/2021 ? ?Is this a "Controlled Substance" medicine? no ? ?Have you seen your PCP in the last 2 weeks? no ? ?If YES, route message to pool  -  If NO, patient needs to be scheduled for appointment. ? ?What is the name of the medication or equipment? Motoprol-24m ? ?Have you contacted your pharmacy to request a refill? no  ? ?Which pharmacy would you like this sent to? [facility] (usually does mail order, but she is out of meds) ? ? ?Patient notified that their request is being sent to the clinical staff for review and that they should receive a response within 2 business days.  ? ?Gottschalk's pt. ?  ?

## 2021-06-03 NOTE — Telephone Encounter (Signed)
Pt aware refill sent to [facility] ?

## 2021-06-14 ENCOUNTER — Ambulatory Visit (HOSPITAL_COMMUNITY)
Admission: RE | Admit: 2021-06-14 | Discharge: 2021-06-14 | Disposition: A | Discharge status: Home / Self Care | Payer: Medicare HMO | Source: Ambulatory Visit | Attending: Pulmonary Disease | Admitting: Pulmonary Disease

## 2021-06-14 ENCOUNTER — Ambulatory Visit: Payer: Medicare HMO | Admitting: Pulmonary Disease

## 2021-06-14 ENCOUNTER — Encounter: Payer: Self-pay | Admitting: Pulmonary Disease

## 2021-06-14 VITALS — BP 132/80 | HR 56 | Temp 98.7°F | Ht 62.0 in | Wt 242.8 lb

## 2021-06-14 DIAGNOSIS — R0609 Other forms of dyspnea: Secondary | ICD-10-CM

## 2021-06-14 DIAGNOSIS — G4733 Obstructive sleep apnea (adult) (pediatric): Secondary | ICD-10-CM

## 2021-06-14 DIAGNOSIS — R0602 Shortness of breath: Secondary | ICD-10-CM | POA: Diagnosis not present

## 2021-06-14 DIAGNOSIS — I517 Cardiomegaly: Secondary | ICD-10-CM | POA: Diagnosis not present

## 2021-06-14 DIAGNOSIS — J9811 Atelectasis: Secondary | ICD-10-CM | POA: Diagnosis not present

## 2021-06-14 DIAGNOSIS — J4550 Severe persistent asthma, uncomplicated: Secondary | ICD-10-CM | POA: Diagnosis not present

## 2021-06-14 MED ORDER — BREZTRI AEROSPHERE 160-9-4.8 MCG/ACT IN AERO: 2.0000 | INHALATION_SPRAY | Freq: Two times a day (BID) | RESPIRATORY_TRACT | 5 refills | Status: DC

## 2021-06-14 MED ORDER — BREZTRI AEROSPHERE 160-9-4.8 MCG/ACT IN AERO: 2.0000 | INHALATION_SPRAY | Freq: Two times a day (BID) | RESPIRATORY_TRACT | 0 refills | Status: DC

## 2021-06-14 NOTE — Patient Instructions (Signed)
Breztri two puffs in the morning and two puffs in the evening, and rinse your mouth after each use. ?Uses a spacer device with breztri and albuterol puffers. ?Stop using symbicort once you start using breztri. ?Will arrange for lab tests, and chest xray. ?Will schedule home sleep study. ?Will arrange for pulmonary function test at [facility] office and arrange for follow up with Dr. Halford Chessman or Nurse Practitioner after your complete your pulmonary function test. ?

## 2021-06-14 NOTE — Progress Notes (Signed)
? ? Pulmonary, Critical Care, and Sleep Medicine ? ?Chief Complaint  ?Patient presents with  ? Consult  ?  Ref by Dr. Harl Bowie for OSA. Has not used in CPAP in over a year   ? ? ?Past Surgical History:  ?She  has a past surgical history that includes Cholecystectomy (2018); Abdominal hysterectomy; Cesarean section; Replacement total knee bilateral (Bilateral); Coronary stent placement (10/2017); LEFT HEART CATH AND CORONARY ANGIOGRAPHY (N/A, 02/25/2019); Breast excisional biopsy; and RIGHT/LEFT HEART CATH AND CORONARY ANGIOGRAPHY (N/A, 09/22/2020). ? ?Past Medical History:  ?Allergies, Anxiety, Arthritis, Asthma, Depression, DM type 2, GERD, HLD, HTN, Lt leg DVT after knee surgery, CAD, Chronic diastolic CHF ? ?Constitutional:  ?BP 132/80 (BP Location: Left Wrist, Patient Position: Sitting)   Pulse (!) 56   Temp 98.7 ?F (37.1 ?C) (Temporal)   Ht 5' 2"  (1.575 m)   Wt 242 lb 12.8 oz (110.1 kg)   SpO2 98% Comment: ra  BMI 44.41 kg/m?  ? ?Brief Summary:  ?Amber Reeves is a 71 y.o. female with snoring and dyspnea. ?  ? ? ? ?Subjective:  ? ?She had sleep study in 2018 at [facility] in [place], [place]  This showed mild sleep apnea and she was on CPAP 9 cm H2O.  She was also followed by pulmonary there for asthma.   ? ?She had trouble adjusting to CPAP mask and got frequent sinus infections and bronchitis.  She stopped using CPAP about 4 years ago.  She still snores. She feels tired during the day.  She wakes up several times at night to use the bathroom.  She goes to bed at midnight and wakes up at 9 am.  She is not using anything to help sleep or stay awake.  Epworth score is 7 out of 24. ? ?She is from [place].  She worked in a pharmacy.  No history of smoking, but she has second hand tobacco exposure.  She was told in [place] that she didn't have COPD.  She had allergy testing done in the 1990s and was told she is allergic to everything in the air.  She has allergies to honey and artifical sweeteners.   No issues with aspirin.  She had pet dog and cats.   ? ?Physical Exam:  ? ?Appearance - well kempt  ? ?ENMT - no sinus tenderness, no oral exudate, no LAN, Mallampati 4 airway, no stridor, wears dentures ? ?Respiratory - equal breath sounds bilaterally, no wheezing or rales ? ?CV - s1s2 regular rate and rhythm, no murmurs ? ?Ext - no clubbing, no edema ? ?Skin - no rashes ? ?Psych - normal mood and affect ?  ?Pulmonary testing:  ? ? ?Chest Imaging:  ?CT angio chest 07/16/15 >> Lt lung ATX ? ?Sleep Tests:  ?PSG 06/03/16 >> AHI 13 ? ?Cardiac Tests:  ?Echo 07/07/20 >> EF 65 to 70%, mild LVH, grade 2 DD, RVSP 35.9 mmHg ? ?Social History:  ?She  reports that she has never smoked. She has never used smokeless tobacco. She reports that she does not drink alcohol and does not use drugs. ? ?Family History:  ?Her family history includes Cancer in her father; Heart disease in her mother; Hypertension in her mother; Stroke in her father and mother. ?  ? ?Discussion:  ?She has snoring, sleep disruption, apnea, and daytime sleepiness.  She has history of CAD and HTN.  Her BMI is > 35.  I am concerned she could still have obstructive sleep apnea. ? ?She has  dyspnea on exertion associated with cough and wheeze.  She gets frequent episodes of bronchitis.  She has history of allergic asthma and second hand tobacco abuse.   ? ?Assessment/Plan:  ? ?Snoring with excessive daytime sleepiness. ?- will need to arrange for a home sleep study ? ?Dyspnea on exertion with history of allergic asthma and second hand tobacco exposure. ?- will arrange for CBC with Diff, RAST with IgE, PFT, and chest xray ?- will change from symbicort to breztri; sample provided and demonstrated inhaler technique ?- continue prn albuterol ?- spacer device provided ?- obesity and deconditioning likely also contributing to her dyspnea ? ?Obesity. ?- discussed how weight can impact sleep and risk for sleep disordered breathing ?- discussed options to assist with weight  loss: combination of diet modification, cardiovascular and strength training exercises ? ?Cardiovascular risk. ?- had an extensive discussion regarding the adverse health consequences related to untreated sleep disordered breathing ?- specifically discussed the risks for hypertension, coronary artery disease, cardiac dysrhythmias, cerebrovascular disease, and diabetes ?- lifestyle modification discussed ? ?Safe driving practices. ?- discussed how sleep disruption can increase risk of accidents, particularly when driving ?- safe driving practices were discussed ? ?Therapies for obstructive sleep apnea. ?- if the sleep study shows significant sleep apnea, then various therapies for treatment were reviewed: CPAP, oral appliance, and surgical interventions ? ? ? ?Time Spent Involved in Patient Care on Day of Examination:  ?49 minutes ? ?Follow up:  ? ?Patient Instructions  ?Breztri two puffs in the morning and two puffs in the evening, and rinse your mouth after each use. ?Uses a spacer device with breztri and albuterol puffers. ?Stop using symbicort once you start using breztri. ?Will arrange for lab tests, and chest xray. ?Will schedule home sleep study. ?Will arrange for pulmonary function test at [facility] office and arrange for follow up with Dr. Halford Chessman or Nurse Practitioner after your complete your pulmonary function test. ? ?Medication List:  ? ?Allergies as of 06/14/2021   ? ?   Reactions  ? Aspartame Rash  ? Penicillins Itching, Rash  ? Did it involve swelling of the face/tongue/throat, SOB, or low BP? No ?Did it involve sudden or severe rash/hives, skin peeling, or any reaction on the inside of your mouth or nose? No ?Did you need to seek medical attention at a hospital or doctor's office? No ?When did it last happen?~25 years ago       ?If all above answers are ?NO?, may proceed with cephalosporin use.  ? Wilder Glade [dapagliflozin]   ? UTIs  ? Metformin And Related   ? GI distress, dizziness  ? No [employer] Food  Allergies   ? Honey-Rash ?Artificial Sweeteners-rash  ? Paxil [paroxetine Hcl] Other (See Comments)  ? Twitching/feels like skin crawling  ? Prozac [fluoxetine Hcl] Other (See Comments)  ? Twitching/feels like skin crawling  ? Zoloft [sertraline Hcl] Other (See Comments)  ? Twitching/feels like skin crawling  ? Ranexa [ranolazine] Palpitations  ? Heart racing  ? ?  ? ?  ?Medication List  ?  ? ?  ? Accurate as of June 14, 2021 10:18 AM. If you have any questions, ask your nurse or doctor.  ?  ?  ? ?  ? ?STOP taking these medications   ? ?budesonide-formoterol 160-4.5 MCG/ACT inhaler ?Commonly known as: SYMBICORT ?Stopped by: Chesley Mires, MD ?  ? ?  ? ?TAKE these medications   ? ?Accu-Chek Aviva Soln ?2 Bottles by Other route as needed. ?  ?albuterol 108 (  90 Base) MCG/ACT inhaler ?Commonly known as: VENTOLIN HFA ?TAKE 2 PUFFS BY MOUTH EVERY 4 HOURS AS NEEDED FOR WHEEZE ?  ?amLODipine 10 MG tablet ?Commonly known as: NORVASC ?Take 1 tablet (10 mg total) by mouth daily. ?  ?aspirin EC 81 MG tablet ?Take 81 mg by mouth in the morning. ?  ?atorvastatin 40 MG tablet ?Commonly known as: LIPITOR ?Take 1 tablet (40 mg total) by mouth at bedtime. ?  ?B-D SINGLE USE SWABS REGULAR Pads ?Apply topically. ?  ?Breztri Aerosphere 160-9-4.8 MCG/ACT Aero ?Generic drug: Budeson-Glycopyrrol-Formoterol ?Inhale 2 puffs into the lungs in the morning and at bedtime. ?Started by: Chesley Mires, MD ?  ?CALCIUM 600+D3 PO ?Take 1 tablet by mouth in the morning. ?  ?diphenhydrAMINE 25 MG tablet ?Commonly known as: BENADRYL ?Take 50 mg by mouth at bedtime. ?  ?docusate sodium 100 MG capsule ?Commonly known as: COLACE ?Take 100 mg by mouth 2 (two) times daily as needed (constipation.). ?  ?fluticasone 50 MCG/ACT nasal spray ?Commonly known as: FLONASE ?Place 2 sprays into both nostrils daily. ?  ?furosemide 20 MG tablet ?Commonly known as: LASIX ?Take 1 tablet (20 mg total) by mouth as needed (based on weight - may take one extra tab for weight  gain of 3lb in 24 hrs or 5lb in 1 week). ?  ?glucose blood test strip ?TEST BLOOD SUGAR TWICE DAILY ?  ?ibuprofen 200 MG tablet ?Commonly known as: ADVIL ?Take 400 mg by mouth daily as needed (pain (leg pain)

## 2021-06-14 NOTE — Addendum Note (Signed)
Addended by: Fritzi Mandes D on: 06/14/2021 12:45 PM ? ? Modules accepted: Orders ? ?

## 2021-06-17 LAB — IGE+ALLERGENS ZONE 2(30)
Alternaria Alternata IgE: <0.1 kU/L
Amer Sycamore IgE Qn: <0.1 kU/L
Aspergillus Fumigatus IgE: <0.1 kU/L
Bahia Grass IgE: <0.1 kU/L
Bermuda Grass IgE: <0.1 kU/L
Cat Dander IgE: <0.1 kU/L
Cedar, Mountain IgE: 0.1 kU/L — AB
Cladosporium Herbarum IgE: <0.1 kU/L
Cockroach, American IgE: <0.1 kU/L
Common Silver Birch IgE: <0.1 kU/L
D Farinae IgE: <0.1 kU/L
D Pteronyssinus IgE: 0.11 kU/L — AB
Dog Dander IgE: <0.1 kU/L
Elm, American IgE: <0.1 kU/L
Hickory, White IgE: <0.1 kU/L
IgE (Immunoglobulin E), Serum: 548 IU/mL — ABNORMAL HIGH (ref 6–495)
Johnson Grass IgE: <0.1 kU/L
Maple/Box Elder IgE: <0.1 kU/L
Mucor Racemosus IgE: <0.1 kU/L
Mugwort IgE Qn: <0.1 kU/L
Nettle IgE: <0.1 kU/L
Oak, White IgE: <0.1 kU/L
Penicillium Chrysogen IgE: <0.1 kU/L
Pigweed, Rough IgE: <0.1 kU/L
Plantain, English IgE: <0.1 kU/L
Ragweed, Short IgE: <0.1 kU/L
Sheep Sorrel IgE Qn: <0.1 kU/L
Stemphylium Herbarum IgE: <0.1 kU/L
Sweet gum IgE RAST Ql: <0.1 kU/L
Timothy Grass IgE: <0.1 kU/L
White Mulberry IgE: <0.1 kU/L

## 2021-06-17 LAB — CBC WITH DIFFERENTIAL/PLATELET
Basophils Absolute: 0.1 10*3/uL (ref 0.0–0.2)
Basos: 1 %
EOS (ABSOLUTE): 0.3 10*3/uL (ref 0.0–0.4)
Eos: 5 %
Hematocrit: 36.8 % (ref 34.0–46.6)
Hemoglobin: 11.9 g/dL (ref 11.1–15.9)
Immature Grans (Abs): 0 10*3/uL (ref 0.0–0.1)
Immature Granulocytes: 1 %
Lymphocytes Absolute: 1.6 10*3/uL (ref 0.7–3.1)
Lymphs: 25 %
MCH: 31.3 pg (ref 26.6–33.0)
MCHC: 32.3 g/dL (ref 31.5–35.7)
MCV: 97 fL (ref 79–97)
Monocytes Absolute: 0.5 10*3/uL (ref 0.1–0.9)
Monocytes: 8 %
Neutrophils Absolute: 3.8 10*3/uL (ref 1.4–7.0)
Neutrophils: 60 %
Platelets: 229 10*3/uL (ref 150–450)
RBC: 3.8 x10E6/uL (ref 3.77–5.28)
RDW: 13 % (ref 11.7–15.4)
WBC: 6.2 10*3/uL (ref 3.4–10.8)

## 2021-06-23 ENCOUNTER — Telehealth: Payer: Self-pay | Admitting: Pharmacist

## 2021-06-23 NOTE — Telephone Encounter (Signed)
Please let patient know: ?[place] patient assistance supply in fridge for pick up ?Thanks! ?

## 2021-06-23 NOTE — Telephone Encounter (Signed)
Pt aware.

## 2021-06-24 ENCOUNTER — Encounter: Payer: Self-pay | Admitting: Family

## 2021-06-24 ENCOUNTER — Ambulatory Visit (INDEPENDENT_AMBULATORY_CARE_PROVIDER_SITE_OTHER): Payer: Medicare HMO | Admitting: Family

## 2021-06-24 VITALS — BP 129/59 | HR 59 | Temp 97.2°F | Resp 16 | Ht 62.0 in | Wt 242.0 lb

## 2021-06-24 DIAGNOSIS — H6983 Other specified disorders of Eustachian tube, bilateral: Secondary | ICD-10-CM | POA: Diagnosis not present

## 2021-06-24 DIAGNOSIS — H9313 Tinnitus, bilateral: Secondary | ICD-10-CM | POA: Diagnosis not present

## 2021-06-24 MED ORDER — CETIRIZINE HCL 10 MG PO TABS
10.0000 mg | ORAL_TABLET | Freq: Every day | ORAL | 1 refills | Status: DC
Start: 2021-06-24 — End: 2022-10-05

## 2021-06-24 MED ORDER — FLUTICASONE PROPIONATE 50 MCG/ACT NA SUSP: 2.0000 | Freq: Every day | NASAL | 6 refills | Status: DC

## 2021-06-24 NOTE — Progress Notes (Signed)
? ?Subjective:  ? ? Patient ID: Amber Reeves, female    DOB: 19-Jun-1950, 71 y.o.   MRN: [id number] ? ?Chief Complaint  ?Patient presents with  ? Ear Fullness  ? ?PT presents to the office today with bilateral ear fullness and ringing.  ?Ear Fullness  ?There is pain in both ears. This is a new problem. The current episode started in the past 7 days. The problem occurs constantly. The problem has been unchanged. There has been no fever. The pain is at a severity of 2/10. The pain is mild. Associated symptoms include coughing and headaches. Pertinent negatives include no diarrhea, ear discharge, neck pain, rhinorrhea or sore throat. Treatments tried: benadryl, flonase. The treatment provided mild relief.  ? ? ? ?Review of Systems  ?HENT:  Negative for ear discharge, rhinorrhea and sore throat.   ?Respiratory:  Positive for cough.   ?Gastrointestinal:  Negative for diarrhea.  ?Musculoskeletal:  Negative for neck pain.  ?Neurological:  Positive for headaches.  ?All other systems reviewed and are negative. ? ?   ?Objective:  ? Physical Exam ?Vitals reviewed.  ?Constitutional:   ?   General: She is not in acute distress. ?   Appearance: She is well-developed.  ?HENT:  ?   Head: Normocephalic and atraumatic.  ?   Right Ear: Tympanic membrane normal.  ?   Left Ear: Tympanic membrane normal.  ?Eyes:  ?   Pupils: Pupils are equal, round, and reactive to light.  ?Neck:  ?   Thyroid: No thyromegaly.  ?Cardiovascular:  ?   Rate and Rhythm: Normal rate and regular rhythm.  ?   Heart sounds: Normal heart sounds. No murmur heard. ?Pulmonary:  ?   Effort: Pulmonary effort is normal. No respiratory distress.  ?   Breath sounds: Normal breath sounds. No wheezing.  ?Abdominal:  ?   General: Bowel sounds are normal. There is no distension.  ?   Palpations: Abdomen is soft.  ?   Tenderness: There is no abdominal tenderness.  ?Musculoskeletal:     ?   General: No tenderness. Normal range of motion.  ?   Cervical back: Normal range of  motion and neck supple.  ?Skin: ?   General: Skin is warm and dry.  ?Neurological:  ?   Mental Status: She is alert and oriented to person, place, and time.  ?   Cranial Nerves: No cranial nerve deficit.  ?   Deep Tendon Reflexes: Reflexes are normal and symmetric.  ?Psychiatric:     ?   Behavior: Behavior normal.     ?   Thought Content: Thought content normal.     ?   Judgment: Judgment normal.  ? ? ?BP (!) 129/59   Pulse (!) 59   Temp (!) 97.2 ?F (36.2 ?C)   Resp 16   Ht 5' 2"  (1.575 m)   Wt 242 lb (109.8 kg)   SpO2 98%   BMI 44.26 kg/m?  ? ? ? ?   ?Assessment & Plan:  ?Amber Reeves comes in today with chief complaint of Ear Fullness ? ? ?Diagnosis and orders addressed: ? ?1. Dysfunction of both eustachian tubes ?- fluticasone (FLONASE) 50 MCG/ACT nasal spray; Place 2 sprays into both nostrils daily.  Dispense: 16 g; Refill: 6 ?- cetirizine (ZYRTEC ALLERGY) 10 MG tablet; Take 1 tablet (10 mg total) by mouth daily.  Dispense: 90 tablet; Refill: 1 ? ?2. Tinnitus of both ears ? ? ? ?Start decongestant, zyrtec, flonase.  ?Continue Singulair ?  Force fluds ?Avoid allergies ?If symptoms continue, may need ENT referral   ? ?Evelina Dun, FNP ? ? ? ?

## 2021-06-24 NOTE — Patient Instructions (Signed)
Tinnitus ?Tinnitus refers to hearing a sound when there is no actual source for that sound. This is often described as ringing in the ears. However, people with this condition may hear a variety of noises, in one ear or in both ears. ?The sounds of tinnitus can be soft, loud, or somewhere in between. Tinnitus can last for a few seconds or can be constant for days. It may go away without treatment and come back at various times. When tinnitus is constant or happens often, it can lead to other problems, such as trouble sleeping and trouble concentrating. ?Almost everyone experiences tinnitus at some point. Tinnitus is not the same as hearing loss. Tinnitus that is long-lasting (chronic) or comes back often (recurs) may require medical attention. ?What are the causes? ?The cause of tinnitus is often not known. In some cases, it can result from: ?Exposure to loud noises from machinery, music, or other sources. ?An object (foreign body) stuck in the ear. ?Earwax buildup. ?Drinking alcohol or caffeine. ?Taking certain medicines. ?Age-related hearing loss. ?It may also be caused by medical conditions such as: ?Ear or sinus infections. ?Heart diseases or high blood pressure. ?Allergies. ?M?ni?re's disease. ?Thyroid problems. ?Tumors. ?A weak, bulging blood vessel (aneurysm) near the ear. ?What increases the risk? ?The following factors may make you more likely to develop this condition: ?Exposure to loud noises. ?Age. Tinnitus is more likely in older individuals. ?Using alcohol or tobacco. ?What are the signs or symptoms? ?The main symptom of tinnitus is hearing a sound when there is no source for that sound. It may sound like: ?Buzzing. ?Sizzling. ?Ringing. ?Blowing air. ?Hissing. ?Whistling. ?Other sounds may include: ?Roaring. ?Running water. ?A musical note. ?Tapping. ?Humming. ?Symptoms may affect only one ear (unilateral) or both ears (bilateral). ?How is this diagnosed? ?Tinnitus is diagnosed based on your symptoms,  your medical history, and a physical exam. Your health care provider may do a thorough hearing test (audiologic exam) if your tinnitus: ?Is unilateral. ?Causes hearing difficulties. ?Lasts 6 months or longer. ?You may work with a health care provider who specializes in hearing disorders (audiologist). You may be asked questions about your symptoms and how they affect your daily life. You may have other tests done, such as: ?CT scan. ?MRI. ?An imaging test of how blood flows through your blood vessels (angiogram). ?How is this treated? ?Treating an underlying medical condition can sometimes make tinnitus go away. If your tinnitus continues, other treatments may include: ?Therapy and counseling to help you manage the stress of living with tinnitus. ?Sound generators to mask the tinnitus. These include: ?Tabletop sound machines that play relaxing sounds to help you fall asleep. ?Wearable devices that fit in your ear and play sounds or music. ?Acoustic neural stimulation. This involves using headphones to listen to music that contains an auditory signal. Over time, listening to this signal may change some pathways in your brain and make you less sensitive to tinnitus. This treatment is used for very severe cases when no other treatment is working. ?Using hearing aids or cochlear implants if your tinnitus is related to hearing loss. Hearing aids are worn in the outer ear. Cochlear implants are surgically placed in the inner ear. ?Follow these instructions at home: ?Managing symptoms ? ?  ? ?When possible, avoid being in loud places and being exposed to loud sounds. ?Wear hearing protection, such as earplugs, when you are exposed to loud noises. ?Use a white noise machine, a humidifier, or other devices to mask the sound of tinnitus. ?  Practice techniques for reducing stress, such as meditation, yoga, or deep breathing. Work with your health care provider if you need help with managing stress. ?Sleep with your head  slightly raised. This may reduce the impact of tinnitus. ?General instructions ?Do not use stimulants, such as nicotine, alcohol, or caffeine. Talk with your health care provider about other stimulants to avoid. Stimulants are substances that can make you feel alert and attentive by increasing certain activities in the body (such as heart rate and blood pressure). These substances may make tinnitus worse. ?Take over-the-counter and prescription medicines only as told by your health care provider. ?Try to get plenty of sleep each night. ?Keep all follow-up visits. This is important. ?Contact a health care provider if: ?Your tinnitus continues for 3 weeks or longer without stopping. ?You develop sudden hearing loss. ?Your symptoms get worse or do not get better with home care. ?You feel you are not able to manage the stress of living with tinnitus. ?Get help right away if: ?You develop tinnitus after a head injury. ?You have tinnitus along with any of the following: ?Dizziness. ?Nausea and vomiting. ?Loss of balance. ?Sudden, severe headache. ?Vision changes. ?Facial weakness or weakness of arms or legs. ?These symptoms may represent a serious problem that is an emergency. Do not wait to see if the symptoms will go away. Get medical help right away. Call your local emergency services (911 in the [place] Do not drive yourself to the hospital. ?Summary ?Tinnitus refers to hearing a sound when there is no actual source for that sound. This is often described as ringing in the ears. ?Symptoms may affect only one ear (unilateral) or both ears (bilateral). ?Use a white noise machine, a humidifier, or other devices to mask the sound of tinnitus. ?Do not use stimulants, such as nicotine, alcohol, or caffeine. These substances may make tinnitus worse. ?This information is not intended to replace advice given to you by your health care provider. Make sure you discuss any questions you have with your health care  provider. ?Document Revised: 01/13/2020 Document Reviewed: 01/13/2020 ?Elsevier Patient Education ? [place]. ?Eustachian Tube Dysfunction ? ?Eustachian tube dysfunction refers to a condition in which a blockage develops in the narrow passage that connects the middle ear to the back of the nose (eustachian tube). The eustachian tube regulates air pressure in the middle ear by letting air move between the ear and nose. It also helps to drain fluid from the middle ear space. ?Eustachian tube dysfunction can affect one or both ears. When the eustachian tube does not function properly, air pressure, fluid, or both can build up in the middle ear. ?What are the causes? ?This condition occurs when the eustachian tube becomes blocked or cannot open normally. Common causes of this condition include: ?Ear infections. ?Colds and other infections that affect the nose, mouth, and throat (upper respiratory tract). ?Allergies. ?Irritation from cigarette smoke. ?Irritation from stomach acid coming up into the esophagus (gastroesophageal reflux). The esophagus is the part of the body that moves food from the mouth to the stomach. ?Sudden changes in air pressure, such as from descending in an airplane or scuba diving. ?Abnormal growths in the nose or throat, such as: ?Growths that line the nose (nasal polyps). ?Abnormal growth of cells (tumors). ?Enlarged tissue at the back of the throat (adenoids). ?What increases the risk? ?You are more likely to develop this condition if: ?You smoke. ?You are overweight. ?You are a child who has: ?Certain birth defects of  the mouth, such as cleft palate. ?Large tonsils or adenoids. ?What are the signs or symptoms? ?Common symptoms of this condition include: ?A feeling of fullness in the ear. ?Ear pain. ?Clicking or popping noises in the ear. ?Ringing in the ear (tinnitus). ?Hearing loss. ?Loss of balance. ?Dizziness. ?Symptoms may get worse when the air pressure around you changes, such  as when you travel to an area of high elevation, fly on an airplane, or go scuba diving. ?How is this diagnosed? ?This condition may be diagnosed based on: ?Your symptoms. ?A physical exam of your ears, nose, and throat. ?Tes

## 2021-06-29 ENCOUNTER — Ambulatory Visit (INDEPENDENT_AMBULATORY_CARE_PROVIDER_SITE_OTHER): Payer: Medicare HMO | Admitting: Nurse Practitioner

## 2021-06-29 ENCOUNTER — Encounter: Payer: Self-pay | Admitting: Nurse Practitioner

## 2021-06-29 VITALS — BP 110/66 | HR 64 | Temp 98.7°F | Ht 63.0 in | Wt 235.6 lb

## 2021-06-29 DIAGNOSIS — H9313 Tinnitus, bilateral: Secondary | ICD-10-CM

## 2021-06-29 MED ORDER — PREDNISONE 10 MG (21) PO TBPK: ORAL_TABLET | ORAL | 0 refills | Status: DC

## 2021-06-29 NOTE — Patient Instructions (Signed)
Tinnitus ?Tinnitus refers to hearing a sound when there is no actual source for that sound. This is often described as ringing in the ears. However, people with this condition may hear a variety of noises, in one ear or in both ears. ?The sounds of tinnitus can be soft, loud, or somewhere in between. Tinnitus can last for a few seconds or can be constant for days. It may go away without treatment and come back at various times. When tinnitus is constant or happens often, it can lead to other problems, such as trouble sleeping and trouble concentrating. ?Almost everyone experiences tinnitus at some point. Tinnitus is not the same as hearing loss. Tinnitus that is long-lasting (chronic) or comes back often (recurs) may require medical attention. ?What are the causes? ?The cause of tinnitus is often not known. In some cases, it can result from: ?Exposure to loud noises from machinery, music, or other sources. ?An object (foreign body) stuck in the ear. ?Earwax buildup. ?Drinking alcohol or caffeine. ?Taking certain medicines. ?Age-related hearing loss. ?It may also be caused by medical conditions such as: ?Ear or sinus infections. ?Heart diseases or high blood pressure. ?Allergies. ?M?ni?re's disease. ?Thyroid problems. ?Tumors. ?A weak, bulging blood vessel (aneurysm) near the ear. ?What increases the risk? ?The following factors may make you more likely to develop this condition: ?Exposure to loud noises. ?Age. Tinnitus is more likely in older individuals. ?Using alcohol or tobacco. ?What are the signs or symptoms? ?The main symptom of tinnitus is hearing a sound when there is no source for that sound. It may sound like: ?Buzzing. ?Sizzling. ?Ringing. ?Blowing air. ?Hissing. ?Whistling. ?Other sounds may include: ?Roaring. ?Running water. ?A musical note. ?Tapping. ?Humming. ?Symptoms may affect only one ear (unilateral) or both ears (bilateral). ?How is this diagnosed? ?Tinnitus is diagnosed based on your symptoms,  your medical history, and a physical exam. Your health care provider may do a thorough hearing test (audiologic exam) if your tinnitus: ?Is unilateral. ?Causes hearing difficulties. ?Lasts 6 months or longer. ?You may work with a health care provider who specializes in hearing disorders (audiologist). You may be asked questions about your symptoms and how they affect your daily life. You may have other tests done, such as: ?CT scan. ?MRI. ?An imaging test of how blood flows through your blood vessels (angiogram). ?How is this treated? ?Treating an underlying medical condition can sometimes make tinnitus go away. If your tinnitus continues, other treatments may include: ?Therapy and counseling to help you manage the stress of living with tinnitus. ?Sound generators to mask the tinnitus. These include: ?Tabletop sound machines that play relaxing sounds to help you fall asleep. ?Wearable devices that fit in your ear and play sounds or music. ?Acoustic neural stimulation. This involves using headphones to listen to music that contains an auditory signal. Over time, listening to this signal may change some pathways in your brain and make you less sensitive to tinnitus. This treatment is used for very severe cases when no other treatment is working. ?Using hearing aids or cochlear implants if your tinnitus is related to hearing loss. Hearing aids are worn in the outer ear. Cochlear implants are surgically placed in the inner ear. ?Follow these instructions at home: ?Managing symptoms ? ?  ? ?When possible, avoid being in loud places and being exposed to loud sounds. ?Wear hearing protection, such as earplugs, when you are exposed to loud noises. ?Use a white noise machine, a humidifier, or other devices to mask the sound of tinnitus. ?  Practice techniques for reducing stress, such as meditation, yoga, or deep breathing. Work with your health care provider if you need help with managing stress. ?Sleep with your head  slightly raised. This may reduce the impact of tinnitus. ?General instructions ?Do not use stimulants, such as nicotine, alcohol, or caffeine. Talk with your health care provider about other stimulants to avoid. Stimulants are substances that can make you feel alert and attentive by increasing certain activities in the body (such as heart rate and blood pressure). These substances may make tinnitus worse. ?Take over-the-counter and prescription medicines only as told by your health care provider. ?Try to get plenty of sleep each night. ?Keep all follow-up visits. This is important. ?Contact a health care provider if: ?Your tinnitus continues for 3 weeks or longer without stopping. ?You develop sudden hearing loss. ?Your symptoms get worse or do not get better with home care. ?You feel you are not able to manage the stress of living with tinnitus. ?Get help right away if: ?You develop tinnitus after a head injury. ?You have tinnitus along with any of the following: ?Dizziness. ?Nausea and vomiting. ?Loss of balance. ?Sudden, severe headache. ?Vision changes. ?Facial weakness or weakness of arms or legs. ?These symptoms may represent a serious problem that is an emergency. Do not wait to see if the symptoms will go away. Get medical help right away. Call your local emergency services (911 in the [place] Do not drive yourself to the hospital. ?Summary ?Tinnitus refers to hearing a sound when there is no actual source for that sound. This is often described as ringing in the ears. ?Symptoms may affect only one ear (unilateral) or both ears (bilateral). ?Use a white noise machine, a humidifier, or other devices to mask the sound of tinnitus. ?Do not use stimulants, such as nicotine, alcohol, or caffeine. These substances may make tinnitus worse. ?This information is not intended to replace advice given to you by your health care provider. Make sure you discuss any questions you have with your health care  provider. ?Document Revised: 01/13/2020 Document Reviewed: 01/13/2020 ?Elsevier Patient Education ? [place]. ? ?

## 2021-06-29 NOTE — Progress Notes (Signed)
? ?Acute Office Visit ? ?Subjective:  ? ?  ?Patient ID: Amber Reeves, female    DOB: Feb 28, 1950, 71 y.o.   MRN: [id number] ? ?Chief Complaint  ?Patient presents with  ? Ear Fullness  ? Hearing Problem  ?  Right ear - last week saw [facility] - states she has a real bad ringing in her ear   ? Shortness of Breath  ?  States she has had this for years but has gotten worse over the last weke  ? Dizziness  ? Nasal Congestion  ?  On right side   ? ? ?Ear Fullness  ?There is pain in both ears. This is a recurrent problem. The current episode started in the past 7 days. The problem occurs constantly. The problem has been unchanged. There has been no fever. The patient is experiencing no pain. Pertinent negatives include no abdominal pain, coughing or ear discharge. Associated symptoms comments: Decreased hearing. She has tried nothing for the symptoms.  ?Dizziness ?This is a recurrent problem. The current episode started in the past 7 days. The problem occurs constantly. The problem has been unchanged. Associated symptoms include fatigue. Pertinent negatives include no abdominal pain, chills, congestion, coughing or fever. Nothing aggravates the symptoms. She has tried nothing for the symptoms.  ? ? ?Review of Systems  ?Constitutional:  Positive for fatigue. Negative for chills and fever.  ?HENT:  Positive for tinnitus. Negative for congestion, ear discharge and sinus pain.   ?Respiratory:  Negative for cough.   ?Cardiovascular: Negative.   ?Gastrointestinal:  Negative for abdominal pain.  ?Neurological:  Positive for dizziness.  ?All other systems reviewed and are negative. ? ? ?   ?Objective:  ?  ?BP 110/66   Pulse 64   Temp 98.7 ?F (37.1 ?C)   Ht 5' 3"  (1.6 m)   Wt 235 lb 9.6 oz (106.9 kg)   SpO2 98%   BMI 41.73 kg/m?  ?BP Readings from Last 3 Encounters:  ?06/29/21 110/66  ?06/24/21 (!) 129/59  ?06/14/21 132/80  ? ?Wt Readings from Last 3 Encounters:  ?06/29/21 235 lb 9.6 oz (106.9 kg)  ?06/24/21 242 lb (109.8 kg)   ?06/14/21 242 lb 12.8 oz (110.1 kg)  ? ?  ? ?Physical Exam ?Vitals reviewed.  ?Constitutional:   ?   Appearance: She is well-developed. She is obese.  ?HENT:  ?   Head: Normocephalic.  ?   Right Ear: External ear normal. Decreased hearing noted. No drainage or tenderness. Tympanic membrane is not erythematous or bulging.  ?   Left Ear: External ear normal. Decreased hearing noted. No drainage or tenderness. Tympanic membrane is not erythematous or bulging.  ?Eyes:  ?   Pupils: Pupils are equal, round, and reactive to light.  ?Cardiovascular:  ?   Rate and Rhythm: Normal rate.  ?Neurological:  ?   Mental Status: She is alert.  ? ? ?No results found for any visits on 06/29/21. ? ? ?   ?Assessment & Plan:  ?Tinnitus symptoms not not resolving.  Completed referral to ENT, prednisone taper, daily cetirizine. ?For dizziness recommend patient gradually and slowly rising and sitting. ?Patient knows to follow-up with worsening unresolved symptoms. ?Problem List Items Addressed This Visit   ?None ?Visit Diagnoses   ? ? Tinnitus of both ears    -  Primary  ? Relevant Medications  ? predniSONE (STERAPRED UNI-PAK 21 TAB) 10 MG (21) TBPK tablet  ? ?  ? ? ?Meds ordered this encounter  ?Medications  ?  predniSONE (STERAPRED UNI-PAK 21 TAB) 10 MG (21) TBPK tablet  ?  Sig: 6 tablets day 1, 5 tablet day 2, 4 tablet day 3, 3 tablet day 4, 2 tablet day 5, 1 tablet day 6  ?  Dispense:  1 each  ?  Refill:  0  ?  Order Specific Question:   Supervising Provider  ?  AnswerClaretta Fraise [[id number]]  ? ? ?No follow-ups on file. ? ?Ivy Lynn, NP ? ? ?

## 2021-07-06 ENCOUNTER — Encounter: Payer: Self-pay | Admitting: Family Medicine

## 2021-07-06 ENCOUNTER — Ambulatory Visit (INDEPENDENT_AMBULATORY_CARE_PROVIDER_SITE_OTHER): Payer: Medicare HMO | Admitting: Family Medicine

## 2021-07-06 ENCOUNTER — Telehealth: Payer: Self-pay

## 2021-07-06 VITALS — BP 159/67 | HR 58 | Temp 98.2°F | Ht 63.0 in | Wt 235.0 lb

## 2021-07-06 DIAGNOSIS — H9313 Tinnitus, bilateral: Secondary | ICD-10-CM | POA: Diagnosis not present

## 2021-07-06 DIAGNOSIS — H9193 Unspecified hearing loss, bilateral: Secondary | ICD-10-CM

## 2021-07-06 NOTE — Progress Notes (Signed)
?  ? ?Subjective:  ?Patient ID: Amber Reeves, female    DOB: 14-Oct-1950, 71 y.o.   MRN: [id number] ? ?Patient Care Team: ?Janora Norlander, DO as PCP - General (Family Medicine) ?Arnoldo Lenis, MD as PCP - Cardiology (Cardiology) ?Cassandria Anger, MD as Consulting Physician (Endocrinology) ?Lavera Guise, [facility] as Pharmacist (Family Medicine)  ? ?Chief Complaint:  Ear Pain ? ? ?HPI: ?Amber Reeves is a 71 y.o. female presenting on 07/06/2021 for Ear Pain ? ? ?Pt presents today with complaints of ongoing tinnitus with mild otalgia. She has been seen twice for the same complaint. She was initially treated with flonase and zyrtec. She returned to office due to continued symptoms and was placed on prednisone taper pack and referred to ENT. She has not received an appointment to see ENT to date. She states she continues to have fullness in her ears, tinnitus, and decreased hearing. She reports the ongoing symptoms are causing her anxiety. No fever, chills, weakness, dizziness, or fatigue.  ? ?Otalgia  ?There is pain in both ears. This is a recurrent problem. The current episode started 1 to 4 weeks ago. The problem occurs constantly. The problem has been unchanged. There has been no fever. Pertinent negatives include no abdominal pain, coughing, diarrhea, ear discharge, headaches, hearing loss, neck pain, rash, rhinorrhea, sore throat or vomiting.  ? ? ?Relevant past medical, surgical, family, and social history reviewed and updated as indicated.  ?Allergies and medications reviewed and updated. Data reviewed: Chart in Epic. ? ? ?Past Medical History:  ?Diagnosis Date  ? Allergy   ? Anxiety   ? Arthritis   ? Asthma   ? Depression   ? Diabetes mellitus without complication ([place])   ? GERD (gastroesophageal reflux disease)   ? Hyperlipidemia   ? Hypertension   ? ? ?Past Surgical History:  ?Procedure Laterality Date  ? ABDOMINAL HYSTERECTOMY    ? BREAST EXCISIONAL BIOPSY    ? CESAREAN SECTION    ? 3  ?  CHOLECYSTECTOMY  2018  ? CORONARY STENT PLACEMENT  10/2017  ? [facility])  ? LEFT HEART CATH AND CORONARY ANGIOGRAPHY N/A 02/25/2019  ? Procedure: LEFT HEART CATH AND CORONARY ANGIOGRAPHY;  Surgeon: Nelva Bush, MD;  Location: [facility];  Service: Cardiovascular;  Laterality: N/A;  ? REPLACEMENT TOTAL KNEE BILATERAL Bilateral   ? done in [place] (2003/2005)  ? RIGHT/LEFT HEART CATH AND CORONARY ANGIOGRAPHY N/A 09/22/2020  ? Procedure: RIGHT/LEFT HEART CATH AND CORONARY ANGIOGRAPHY;  Surgeon: Leonie Man, MD;  Location: [facility];  Service: Cardiovascular;  Laterality: N/A;  ? ? ?Social History  ? ?Socioeconomic History  ? Marital status: Married  ?  Spouse name: Not on file  ? Number of children: Not on file  ? Years of education: Not on file  ? Highest education level: Not on file  ?Occupational History  ? Not on file  ?Tobacco Use  ? Smoking status: Never  ? Smokeless tobacco: Never  ?Vaping Use  ? Vaping Use: Never used  ?Substance and Sexual Activity  ? Alcohol use: Never  ? Drug use: Never  ? Sexual activity: Not Currently  ?Other Topics Concern  ? Not on file  ?Social History Narrative  ? Resides at home with her husband.  She is originally from [place] but relocated to [place] and then to [place] in August 2020.  She has a son who lives about 2 hours away in [facility] [place]  and a sister-in-law who lives in [place].  ? ?Social Determinants of Health  ? ?Financial Resource Strain: Low Risk   ? Difficulty of Paying Living Expenses: Not hard at all  ?Food Insecurity: No Food Insecurity  ? Worried About Charity fundraiser in the Last Year: Never true  ? Ran Out of Food in the Last Year: Never true  ?Transportation Needs: No Transportation Needs  ? Lack of Transportation (Medical): No  ? Lack of Transportation (Non-Medical): No  ?Physical Activity: Inactive  ? Days of Exercise per Week: 0 days  ? Minutes of Exercise per Session: 0 min  ?Stress: No Stress Concern Present   ? Feeling of Stress : Not at all  ?Social Connections: Moderately Isolated  ? Frequency of Communication with Friends and Family: More than three times a week  ? Frequency of Social Gatherings with Friends and Family: More than three times a week  ? Attends Religious Services: Never  ? Active Member of Clubs or Organizations: No  ? Attends Archivist Meetings: Never  ? Marital Status: Married  ?Intimate Partner Violence: Not At Risk  ? Fear of Current or Ex-Partner: No  ? Emotionally Abused: No  ? Physically Abused: No  ? Sexually Abused: No  ? ? ?Outpatient Encounter Medications as of 07/06/2021  ?Medication Sig  ? albuterol (VENTOLIN HFA) 108 (90 Base) MCG/ACT inhaler TAKE 2 PUFFS BY MOUTH EVERY 4 HOURS AS NEEDED FOR WHEEZE  ? Alcohol Swabs (B-D SINGLE USE SWABS REGULAR) PADS Apply topically.  ? aspirin EC 81 MG tablet Take 81 mg by mouth in the morning.  ? atorvastatin (LIPITOR) 40 MG tablet Take 1 tablet (40 mg total) by mouth at bedtime.  ? Blood Glucose Calibration (ACCU-CHEK AVIVA) SOLN 2 Bottles by Other route as needed.  ? Budeson-Glycopyrrol-Formoterol (BREZTRI AEROSPHERE) 160-9-4.8 MCG/ACT AERO Inhale 2 puffs into the lungs in the morning and at bedtime.  ? Calcium Carb-Cholecalciferol (CALCIUM 600+D3 PO) Take 1 tablet by mouth in the morning.  ? cetirizine (ZYRTEC ALLERGY) 10 MG tablet Take 1 tablet (10 mg total) by mouth daily.  ? diphenhydrAMINE (BENADRYL) 25 MG tablet Take 50 mg by mouth at bedtime.  ? docusate sodium (COLACE) 100 MG capsule Take 100 mg by mouth 2 (two) times daily as needed (constipation.).   ? fluticasone (FLONASE) 50 MCG/ACT nasal spray Place 2 sprays into both nostrils daily.  ? furosemide (LASIX) 20 MG tablet Take 1 tablet (20 mg total) by mouth as needed (based on weight - may take one extra tab for weight gain of 3lb in 24 hrs or 5lb in 1 week).  ? glucose blood test strip TEST BLOOD SUGAR TWICE DAILY  ? ibuprofen (ADVIL) 200 MG tablet Take 400 mg by mouth daily as  needed (pain (leg pain)).  ? insulin degludec (TRESIBA FLEXTOUCH) 200 UNIT/ML FlexTouch Pen Inject 40 Units into the skin daily. (Patient taking differently: Inject 38 Units into the skin in the morning.)  ? Insulin Syringe-Needle U-100 31G X 5/16" 0.5 ML MISC Use to inject Insulin four times daily: Use as directed; Dx: E11.9, E66.9  ? ipratropium-albuterol (DUONEB) 0.5-2.5 (3) MG/3ML SOLN TAKE 3 ML (1 VIAL) BY NEBULIZATION EVERY 6 HOURS AS NEEDED FOR WHEEZING.  ? isosorbide mononitrate (IMDUR) 60 MG 24 hr tablet Take 1 tablet (60 mg total) by mouth 2 (two) times daily.  ? lansoprazole (PREVACID) 15 MG capsule Take 30 mg by mouth at bedtime.  ? lisinopril (ZESTRIL) 40 MG tablet TAKE 1  TABLET EVERY DAY  ? magnesium oxide (MAG-OX) 400 MG tablet Take magnesium on days you take lasix  ? metoprolol tartrate (LOPRESSOR) 25 MG tablet Take 1 tablet (25 mg total) by mouth 2 (two) times daily.  ? montelukast (SINGULAIR) 10 MG tablet Take 1 tablet (10 mg total) by mouth at bedtime.  ? nitroGLYCERIN (NITROSTAT) 0.4 MG SL tablet Place 1 tablet (0.4 mg total) under the tongue every 5 (five) minutes as needed for chest pain. (Patient taking differently: Place 0.4 mg under the tongue every 5 (five) minutes x 3 doses as needed for chest pain.)  ? pioglitazone (ACTOS) 30 MG tablet TAKE 1 TABLET BY MOUTH EVERY DAY  ? potassium chloride (KLOR-CON M10) 10 MEQ tablet TAKE 1 TABLET (10 MEQ TOTAL) BY MOUTH EVERY EVENING.  ? predniSONE (STERAPRED UNI-PAK 21 TAB) 10 MG (21) TBPK tablet 6 tablets day 1, 5 tablet day 2, 4 tablet day 3, 3 tablet day 4, 2 tablet day 5, 1 tablet day 6  ? amLODipine (NORVASC) 10 MG tablet Take 1 tablet (10 mg total) by mouth daily.  ? ?Facility-Administered Encounter Medications as of 07/06/2021  ?Medication  ? sodium chloride flush (NS) 0.9 % injection 3 mL  ? ? ?Allergies  ?Allergen Reactions  ? Aspartame Rash  ? Penicillins Itching and Rash  ?  Did it involve swelling of the face/tongue/throat, SOB, or low BP?  No ?Did it involve sudden or severe rash/hives, skin peeling, or any reaction on the inside of your mouth or nose? No ?Did you need to seek medical attention at a hospital or doctor's office? No ?When d

## 2021-07-06 NOTE — Telephone Encounter (Signed)
Pt states she is having shakey, sweats, says she has a very bad ear ache and has a;ready been seen twive by 2 different nurse practioners and nothing is helping ? ? ?

## 2021-07-09 ENCOUNTER — Other Ambulatory Visit: Payer: Self-pay | Admitting: Family Medicine

## 2021-07-09 DIAGNOSIS — E1165 Type 2 diabetes mellitus with hyperglycemia: Secondary | ICD-10-CM

## 2021-07-20 ENCOUNTER — Telehealth: Payer: Self-pay

## 2021-07-20 ENCOUNTER — Ambulatory Visit (INDEPENDENT_AMBULATORY_CARE_PROVIDER_SITE_OTHER): Payer: Medicare HMO

## 2021-07-20 VITALS — Wt 235.0 lb

## 2021-07-20 DIAGNOSIS — Z0000 Encounter for general adult medical examination without abnormal findings: Secondary | ICD-10-CM

## 2021-07-20 NOTE — Patient Instructions (Addendum)
Amber Reeves , Thank you for taking time to come for your Medicare Wellness Visit. I appreciate your ongoing commitment to your health goals. Please review the following plan we discussed and let me know if I can assist you in the future.   Screening recommendations/referrals: Colonoscopy: Declines at this time - ask again next year Mammogram: Done 09/19/2020 - biopsy done after - normal - Repeat mammogram annually - call us if you need help making appointment Bone Density: Done 07/17/2020 - Repeat every 2 years Recommended yearly ophthalmology/optometry visit for glaucoma screening and checkup Recommended yearly dental visit for hygiene and checkup  Vaccinations: declines all Influenza vaccine: recommend every Fall Pneumococcal vaccine: recommend once per lifetime Prevnar-20 Tdap vaccine: recommend every 10 years Shingles vaccine: recommend Shingrix which is 2 doses 2-6 months apart and over 90% effective     Covid-19: Done 05/01/2019, 05/29/2019, & 02/18/2020 - declines another booster  Advanced directives: Advance directive discussed with you today. Even though you declined this today, please call our office should you change your mind, and we can give you the proper paperwork for you to fill out.   Conditions/risks identified: Aim for 4-6 glasses of water, plenty of protein in your diet and try to get up and walk/ stretch every hour for 5-10 minutes at a time.  Next appointment: Follow up in one year for your annual wellness visit    Preventive Care 65 Years and Older, Female Preventive care refers to lifestyle choices and visits with your health care provider that can promote health and wellness. What does preventive care include? A yearly physical exam. This is also called an annual well check. Dental exams once or twice a year. Routine eye exams. Ask your health care provider how often you should have your eyes checked. Personal lifestyle choices, including: Daily care of your teeth  and gums. Regular physical activity. Eating a healthy diet. Avoiding tobacco and drug use. Limiting alcohol use. Practicing safe sex. Taking low-dose aspirin every day. Taking vitamin and mineral supplements as recommended by your health care provider. What happens during an annual well check? The services and screenings done by your health care provider during your annual well check will depend on your age, overall health, lifestyle risk factors, and family history of disease. Counseling  Your health care provider may ask you questions about your: Alcohol use. Tobacco use. Drug use. Emotional well-being. Home and relationship well-being. Sexual activity. Eating habits. History of falls. Memory and ability to understand (cognition). Work and work Statistician. Reproductive health. Screening  You may have the following tests or measurements: Height, weight, and BMI. Blood pressure. Lipid and cholesterol levels. These may be checked every 5 years, or more frequently if you are over 31 years old. Skin check. Lung cancer screening. You may have this screening every year starting at age 27 if you have a 30-pack-year history of smoking and currently smoke or have quit within the past 15 years. Fecal occult blood test (FOBT) of the stool. You may have this test every year starting at age 62. Flexible sigmoidoscopy or colonoscopy. You may have a sigmoidoscopy every 5 years or a colonoscopy every 10 years starting at age 44. Hepatitis C blood test. Hepatitis B blood test. Sexually transmitted disease (STD) testing. Diabetes screening. This is done by checking your blood sugar (glucose) after you have not eaten for a while (fasting). You may have this done every 1-3 years. Bone density scan. This is done to screen for osteoporosis. You may  have this done starting at age 43. Mammogram. This may be done every 1-2 years. Talk to your health care provider about how often you should have regular  mammograms. Talk with your health care provider about your test results, treatment options, and if necessary, the need for more tests. Vaccines  Your health care provider may recommend certain vaccines, such as: Influenza vaccine. This is recommended every year. Tetanus, diphtheria, and acellular pertussis (Tdap, Td) vaccine. You may need a Td booster every 10 years. Zoster vaccine. You may need this after age 34. Pneumococcal 13-valent conjugate (PCV13) vaccine. One dose is recommended after age 101. Pneumococcal polysaccharide (PPSV23) vaccine. One dose is recommended after age 54. Talk to your health care provider about which screenings and vaccines you need and how often you need them. This information is not intended to replace advice given to you by your health care provider. Make sure you discuss any questions you have with your health care provider. Document Released: 03/06/2015 Document Revised: 10/28/2015 Document Reviewed: 12/09/2014 Elsevier Interactive Patient Education  2017 [place] Prevention in the Home Falls can cause injuries. They can happen to people of all ages. There are many things you can do to make your home safe and to help prevent falls. What can I do on the outside of my home? Regularly fix the edges of walkways and driveways and fix any cracks. Remove anything that might make you trip as you walk through a door, such as a raised step or threshold. Trim any bushes or trees on the path to your home. Use bright outdoor lighting. Clear any walking paths of anything that might make someone trip, such as rocks or tools. Regularly check to see if handrails are loose or broken. Make sure that both sides of any steps have handrails. Any raised decks and porches should have guardrails on the edges. Have any leaves, snow, or ice cleared regularly. Use sand or salt on walking paths during winter. Clean up any spills in your garage right away. This includes oil  or grease spills. What can I do in the bathroom? Use night lights. Install grab bars by the toilet and in the tub and shower. Do not use towel bars as grab bars. Use non-skid mats or decals in the tub or shower. If you need to sit down in the shower, use a plastic, non-slip stool. Keep the floor dry. Clean up any water that spills on the floor as soon as it happens. Remove soap buildup in the tub or shower regularly. Attach bath mats securely with double-sided non-slip rug tape. Do not have throw rugs and other things on the floor that can make you trip. What can I do in the bedroom? Use night lights. Make sure that you have a light by your bed that is easy to reach. Do not use any sheets or blankets that are too big for your bed. They should not hang down onto the floor. Have a firm chair that has side arms. You can use this for support while you get dressed. Do not have throw rugs and other things on the floor that can make you trip. What can I do in the kitchen? Clean up any spills right away. Avoid walking on wet floors. Keep items that you use a lot in easy-to-reach places. If you need to reach something above you, use a strong step stool that has a grab bar. Keep electrical cords out of the way. Do not use floor polish  or wax that makes floors slippery. If you must use wax, use non-skid floor wax. Do not have throw rugs and other things on the floor that can make you trip. What can I do with my stairs? Do not leave any items on the stairs. Make sure that there are handrails on both sides of the stairs and use them. Fix handrails that are broken or loose. Make sure that handrails are as long as the stairways. Check any carpeting to make sure that it is firmly attached to the stairs. Fix any carpet that is loose or worn. Avoid having throw rugs at the top or bottom of the stairs. If you do have throw rugs, attach them to the floor with carpet tape. Make sure that you have a light  switch at the top of the stairs and the bottom of the stairs. If you do not have them, ask someone to add them for you. What else can I do to help prevent falls? Wear shoes that: Do not have high heels. Have rubber bottoms. Are comfortable and fit you well. Are closed at the toe. Do not wear sandals. If you use a stepladder: Make sure that it is fully opened. Do not climb a closed stepladder. Make sure that both sides of the stepladder are locked into place. Ask someone to hold it for you, if possible. Clearly mark and make sure that you can see: Any grab bars or handrails. First and last steps. Where the edge of each step is. Use tools that help you move around (mobility aids) if they are needed. These include: Canes. Walkers. Scooters. Crutches. Turn on the lights when you go into a dark area. Replace any light bulbs as soon as they burn out. Set up your furniture so you have a clear path. Avoid moving your furniture around. If any of your floors are uneven, fix them. If there are any pets around you, be aware of where they are. Review your medicines with your doctor. Some medicines can make you feel dizzy. This can increase your chance of falling. Ask your doctor what other things that you can do to help prevent falls. This information is not intended to replace advice given to you by your health care provider. Make sure you discuss any questions you have with your health care provider. Document Released: 12/04/2008 Document Revised: 07/16/2015 Document Reviewed: 03/14/2014 Elsevier Interactive Patient Education  2017 [employer].

## 2021-07-20 NOTE — Telephone Encounter (Signed)
Pulmonologist gave her a new rx - Breztri and it is very expensive - can you help her get this please.

## 2021-07-20 NOTE — Progress Notes (Signed)
Subjective:   Amber Reeves is a 71 y.o. female who presents for Medicare Annual (Subsequent) preventive examination.  Virtual Visit via Telephone Note  I connected with  Amber Reeves on 07/20/21 at  2:45 PM EDT by telephone and verified that I am speaking with the correct person using two identifiers.  Location: Patient: Home Provider: [facility] Persons participating in the virtual visit: patient/Nurse Health Advisor   I discussed the limitations, risks, security and privacy concerns of performing an evaluation and management service by telephone and the availability of in person appointments. The patient expressed understanding and agreed to proceed.  Interactive audio and video telecommunications were attempted between this nurse and patient, however failed, due to patient having technical difficulties OR patient did not have access to video capability.  We continued and completed visit with audio only.  Some vital signs may be absent or patient reported.    E , LPN   Review of Systems     Cardiac Risk Factors include: advanced age (>80mn, >>74women);diabetes mellitus;dyslipidemia;hypertension;obesity (BMI >30kg/m2);sedentary lifestyle;Other (see comment), Risk factor comments: CAD, OSA not using CPAP     Objective:    Today's Vitals   07/20/21 1443  Weight: 235 lb (106.6 kg)  PainSc: 5    Body mass index is 41.63 kg/m.     07/20/2021    2:59 PM 09/22/2020    5:47 AM 07/16/2020    4:14 PM 02/25/2019    5:51 AM  Advanced Directives  Does Patient Have a Medical Advance Directive? No No No No  Would patient like information on creating a medical advance directive? No - Patient declined No - Patient declined Yes (MAU/Ambulatory/Procedural Areas - Information given)     Current Medications (verified) Outpatient Encounter Medications as of 07/20/2021  Medication Sig   albuterol (VENTOLIN HFA) 108 (90 Base) MCG/ACT inhaler TAKE 2 PUFFS BY MOUTH EVERY 4 HOURS  AS NEEDED FOR WHEEZE   Alcohol Swabs (B-D SINGLE USE SWABS REGULAR) PADS Apply topically.   aspirin EC 81 MG tablet Take 81 mg by mouth in the morning.   atorvastatin (LIPITOR) 40 MG tablet Take 1 tablet (40 mg total) by mouth at bedtime.   Blood Glucose Calibration (ACCU-CHEK AVIVA) SOLN 2 Bottles by Other route as needed.   Budeson-Glycopyrrol-Formoterol (BREZTRI AEROSPHERE) 160-9-4.8 MCG/ACT AERO Inhale 2 puffs into the lungs in the morning and at bedtime.   Calcium Carb-Cholecalciferol (CALCIUM 600+D3 PO) Take 1 tablet by mouth in the morning.   cetirizine (ZYRTEC ALLERGY) 10 MG tablet Take 1 tablet (10 mg total) by mouth daily.   diphenhydrAMINE (BENADRYL) 25 MG tablet Take 50 mg by mouth at bedtime.   docusate sodium (COLACE) 100 MG capsule Take 100 mg by mouth 2 (two) times daily as needed (constipation.).    fluticasone (FLONASE) 50 MCG/ACT nasal spray Place 2 sprays into both nostrils daily.   furosemide (LASIX) 20 MG tablet Take 1 tablet (20 mg total) by mouth as needed (based on weight - may take one extra tab for weight gain of 3lb in 24 hrs or 5lb in 1 week).   glucose blood test strip TEST BLOOD SUGAR TWICE DAILY   ibuprofen (ADVIL) 200 MG tablet Take 400 mg by mouth daily as needed (pain (leg pain)).   insulin degludec (TRESIBA FLEXTOUCH) 200 UNIT/ML FlexTouch Pen Inject 40 Units into the skin daily. (Patient taking differently: Inject 38 Units into the skin in the morning.)   Insulin Syringe-Needle U-100 31G X 5/16" 0.5 ML  MISC Use to inject Insulin four times daily: Use as directed; Dx: E11.9, E66.9   ipratropium-albuterol (DUONEB) 0.5-2.5 (3) MG/3ML SOLN TAKE 3 ML (1 VIAL) BY NEBULIZATION EVERY 6 HOURS AS NEEDED FOR WHEEZING.   isosorbide mononitrate (IMDUR) 60 MG 24 hr tablet Take 1 tablet (60 mg total) by mouth 2 (two) times daily.   lansoprazole (PREVACID) 15 MG capsule Take 30 mg by mouth at bedtime.   lisinopril (ZESTRIL) 40 MG tablet TAKE 1 TABLET EVERY DAY   magnesium  oxide (MAG-OX) 400 MG tablet Take magnesium on days you take lasix   metoprolol tartrate (LOPRESSOR) 25 MG tablet Take 1 tablet (25 mg total) by mouth 2 (two) times daily.   montelukast (SINGULAIR) 10 MG tablet Take 1 tablet (10 mg total) by mouth at bedtime.   pioglitazone (ACTOS) 30 MG tablet TAKE 1 TABLET BY MOUTH EVERY DAY   potassium chloride (KLOR-CON M10) 10 MEQ tablet TAKE 1 TABLET (10 MEQ TOTAL) BY MOUTH EVERY EVENING.   amLODipine (NORVASC) 10 MG tablet Take 1 tablet (10 mg total) by mouth daily.   nitroGLYCERIN (NITROSTAT) 0.4 MG SL tablet Place 1 tablet (0.4 mg total) under the tongue every 5 (five) minutes as needed for chest pain. (Patient not taking: Reported on 07/20/2021)   [DISCONTINUED] predniSONE (STERAPRED UNI-PAK 21 TAB) 10 MG (21) TBPK tablet 6 tablets day 1, 5 tablet day 2, 4 tablet day 3, 3 tablet day 4, 2 tablet day 5, 1 tablet day 6   Facility-Administered Encounter Medications as of 07/20/2021  Medication   sodium chloride flush (NS) 0.9 % injection 3 mL    Allergies (verified) Aspartame, Penicillins, Farxiga [dapagliflozin], Metformin and related, No healthtouch food allergies, Paxil [paroxetine hcl], Prozac [fluoxetine hcl], Zoloft [sertraline hcl], Prednisone, and Ranexa [ranolazine]   History: Past Medical History:  Diagnosis Date   Allergy    Anxiety    Arthritis    Asthma    Depression    Diabetes mellitus without complication ([place])    GERD (gastroesophageal reflux disease)    Hyperlipidemia    Hypertension    Past Surgical History:  Procedure Laterality Date   ABDOMINAL HYSTERECTOMY     BREAST EXCISIONAL BIOPSY     CESAREAN SECTION     3   CHOLECYSTECTOMY  2018   CORONARY STENT PLACEMENT  10/2017   [facility])   LEFT HEART CATH AND CORONARY ANGIOGRAPHY N/A 02/25/2019   Procedure: LEFT HEART CATH AND CORONARY ANGIOGRAPHY;  Surgeon: Nelva Bush, MD;  Location: [facility];  Service: Cardiovascular;  Laterality: N/A;    REPLACEMENT TOTAL KNEE BILATERAL Bilateral    done in [place] (2003/2005)   RIGHT/LEFT HEART CATH AND CORONARY ANGIOGRAPHY N/A 09/22/2020   Procedure: RIGHT/LEFT HEART CATH AND CORONARY ANGIOGRAPHY;  Surgeon: Leonie Man, MD;  Location: [facility];  Service: Cardiovascular;  Laterality: N/A;   Family History  Problem Relation Age of Onset   Heart disease Mother    Stroke Mother    Hypertension Mother    Cancer Father    Stroke Father    Social History   Socioeconomic History   Marital status: Married    Spouse name: Fritz Pickerel   Number of children: 3   Years of education: Not on file   Highest education level: Not on file  Occupational History   Occupation: retired  Tobacco Use   Smoking status: Never   Smokeless tobacco: Never  Vaping Use   Vaping Use: Never used  Substance and Sexual Activity   Alcohol use: Never   Drug use: Never   Sexual activity: Not Currently  Other Topics Concern   Not on file  Social History Narrative   Resides at home with her husband.  She is originally from [place] but relocated to [place] and then to [place] in August 2020.  She has a son who lives about 2 hours away in [place] and a sister-in-law who lives in [place].   2 children in [place]   Social Determinants of Health   Financial Resource Strain: Low Risk    Difficulty of Paying Living Expenses: Not hard at all  Food Insecurity: No Food Insecurity   Worried About Charity fundraiser in the Last Year: Never true   Arboriculturist in the Last Year: Never true  Transportation Needs: No Transportation Needs   Lack of Transportation (Medical): No   Lack of Transportation (Non-Medical): No  Physical Activity: Inactive   Days of Exercise per Week: 0 days   Minutes of Exercise per Session: 0 min  Stress: No Stress Concern Present   Feeling of Stress : Only a little  Social Connections: Moderately Isolated   Frequency of Communication with Friends and Family: More than  three times a week   Frequency of Social Gatherings with Friends and Family: Once a week   Attends Religious Services: Never   Marine scientist or Organizations: No   Attends Music therapist: Never   Marital Status: Married    Tobacco Counseling Counseling given: Not Answered   Clinical Intake:  Pre-visit preparation completed: Yes  Pain : 0-10 Pain Score: 5  Pain Type: Chronic pain Pain Location: Head Pain Orientation: Lower, Posterior Pain Descriptors / Indicators: Aching, Constant, Sharp Pain Onset: More than a month ago Pain Frequency: Intermittent     BMI - recorded: 41.63 Nutritional Status: BMI > 30  Obese Nutritional Risks: None Diabetes: Yes CBG done?: No Did pt. bring in CBG monitor from home?: No  How often do you need to have someone help you when you read instructions, pamphlets, or other written materials from your doctor or pharmacy?: 1 - Never  Diabetic? Nutrition Risk Assessment:  Has the patient had any N/V/D within the last 2 months?  No  Does the patient have any non-healing wounds?  No  Has the patient had any unintentional weight loss or weight gain?  No   Diabetes:  Is the patient diabetic?  Yes  If diabetic, was a CBG obtained today?  No  Did the patient bring in their glucometer from home?  No  How often do you monitor your CBG's? Once or twice per day - 93 this am per patient.   Financial Strains and Diabetes Management:  Are you having any financial strains with the device, your supplies or your medication? No .  Does the patient want to be seen by Chronic Care Management for management of their diabetes?  No  Would the patient like to be referred to a Nutritionist or for Diabetic Management?  No   Diabetic Exams:  Diabetic Eye Exam:  Last done in home with [facility] - 08/25/2020. Pt has been advised about the importance in completing this exam. She declines referral today to optometry - I did tell her about [facility] in  [place] and [facility] in [place]  Diabetic Foot Exam: Completed 09/30/2020. Pt has been advised about the importance in completing this exam. Pt is scheduled for diabetic  foot exam on next appt with pcp.    Interpreter Needed?: No  Information entered by ::  , LPN   Activities of Daily Living    07/20/2021    3:00 PM  In your present state of health, do you have any difficulty performing the following activities:  Hearing? 1  Comment mild intermittent  Vision? 0  Difficulty concentrating or making decisions? 0  Walking or climbing stairs? 1  Comment a little - hurts knees  Dressing or bathing? 0  Doing errands, shopping? 0  Preparing Food and eating ? N  Using the Toilet? N  In the past six months, have you accidently leaked urine? N  Do you have problems with loss of bowel control? N  Managing your Medications? N  Managing your Finances? N  Housekeeping or managing your Housekeeping? N    Patient Care Team: Janora Norlander, DO as PCP - General (Family Medicine) Harl Bowie Alphonse Guild, MD as PCP - Cardiology (Cardiology) Cassandria Anger, MD as Consulting Physician (Endocrinology) Lavera Guise, [facility] as Pharmacist (Family Medicine) Chesley Mires, MD as Consulting Physician (Pulmonary Disease)  Indicate any recent Medical Services you may have received from other than [facility] providers in the past year (date may be approximate).     Assessment:   This is a routine wellness examination for Amber Reeves.  Hearing/Vision screen Hearing Screening - Comments:: Mild hearing difficulties intermittently; feels like ears are stopped up - has appt with ENT next month Vision Screening - Comments:: C/o mild vision difficulties; hasn't seen an eye doctor in years - declines referral at thist ime  Dietary issues and exercise activities discussed: Current Exercise Habits: Home exercise routine, Type of exercise: walking;stretching, Time (Minutes): 10, Frequency (Times/Week): 7,  Weekly Exercise (Minutes/Week): 70, Intensity: Mild, Exercise limited by: orthopedic condition(s);cardiac condition(s)   Goals Addressed               This Visit's Progress     DIET - INCREASE WATER INTAKE   Not on track     Increase to 6-8 glasses daily      T2DM, ASTHMA, HLD (pt-stated)   On track     Current Barriers:  Unable to independently afford treatment regimen Unable to maintain control of T2DM Suboptimal therapeutic regimen for T2DM  Pharmacist Clinical Goal(s):  patient will verbalize ability to afford treatment regimen maintain control of T2DM, HLD as evidenced by GOAL A1C, MORE EFFICACIOUS REGIMEN  through collaboration with PharmD and provider.   Interventions: 1:1 collaboration with Janora Norlander, DO regarding development and update of comprehensive plan of care as evidenced by provider attestation and co-signature Inter-disciplinary care team collaboration (see longitudinal plan of care) Comprehensive medication review performed; medication list updated in electronic medical record  Diabetes: Goal on Track (progressing): YES. Controlled-A1C 5.8%; current treatment: TRESIBA, PIOGLITAZONE;  Would like to optimize regimen to provide CV, kidney benefit, however patient intolerant list makes this challenging Considering GLP1 therapy to d/c pioglitazone CONTINUE TRESIBA-->SAMPLES PROVIDED TO BRIDGE TO PATIENT ASSISTANCE SUPPLY Current glucose readings: fasting glucose: n/a' post prandial glucose: n/a Patient not testing at home Recommended patient test--we can likely d/c pioglitazone  Concerned for hypoglycemia Denies hypoglycemic/hyperglycemic symptoms Discussed meal planning options and Plate method for healthy eating Avoid sugary drinks and desserts Incorporate balanced protein, non starchy veggies, 1 serving of carbohydrate with each meal Increase water intake Increase physical activity as able Current exercise: encouraged Educated on diet,  medications Assessed patient finances. Will enroll in novo nordisk PAP (  TRESIBA) VS LILLY CARES PAP (BASAGLAR) FOR INSULIN--WILL NEED PATIENT FINANCIALS FOR NOVO NORDISK  Hyperlipidemia:  Goal on Track (progressing): YES. Controlled; current treatment:atorvastatin;  Medications previously tried: n/a  Current dietary patterns: ENCOURAGED HEART HEALTHY to decrease triglycerides/cholesterol Educated on DIET/EXERICISE  ASTHMA -ON SYMBICORT--[place]; RE-ENROLLMENT SENT FOR 2023 TO [place] -Uses rescue albuterol occasionally --application submitted to [employer] patient assistance -Uses CPAP at night -Controlled, continue current management  Patient Goals/Self-Care Activities patient will:  - take medications as prescribed as evidenced by patient report and record review check glucose DAILY/FASTING, document, and provide at future appointments collaborate with provider on medication access solutions target a minimum of 150 minutes of moderate intensity exercise weekly engage in dietary modifications by FOLLOWING HEART HEALTHY DIET        Depression Screen    07/20/2021    2:57 PM 07/06/2021   11:54 AM 06/29/2021   12:39 PM 06/24/2021    3:11 PM 05/24/2021    1:32 PM 01/28/2021    1:20 PM 01/28/2021    1:19 PM  PHQ 2/9 Scores  PHQ - 2 Score 1  1 2  0 1 1  PHQ- 9 Score 2  2 4 5 6    Exception Documentation  Patient refusal         Fall Risk    07/20/2021    2:50 PM 06/24/2021    3:10 PM 05/24/2021    1:32 PM 01/28/2021    1:19 PM 09/30/2020   12:53 PM  [place] in the past year? 0 0 0 0 0  Number falls in past yr: 0   0   Injury with Fall? 0   0   Risk for fall due to : Impaired balance/gait;Orthopedic patient   Impaired balance/gait   Follow up Falls prevention discussed Falls evaluation completed  Falls prevention discussed     FALL RISK PREVENTION PERTAINING TO THE HOME:  Any stairs in or around the home? Yes  If so, are there any  without handrails? No  Home free of loose throw rugs in walkways, pet beds, electrical cords, etc? Yes  Adequate lighting in your home to reduce risk of falls? Yes   ASSISTIVE DEVICES UTILIZED TO PREVENT FALLS:  Life alert? No  Use of a cane, walker or w/c?  Cane prn when knees are bothering her Grab bars in the bathroom? No  Shower chair or bench in shower? No  Elevated toilet seat or a handicapped toilet? No   TIMED UP AND GO:  Was the test performed? No . Telephonic visit  Cognitive Function:        07/20/2021    3:03 PM 07/16/2020    4:12 PM  6CIT Screen  What Year? 0 points 0 points  What month? 0 points 0 points  What time? 0 points 0 points  Count back from 20 0 points 0 points  Months in reverse 0 points 0 points  Repeat phrase 0 points 0 points  Total Score 0 points 0 points    Immunizations Immunization History  Administered Date(s) Administered   Influenza, High Dose Seasonal PF 02/16/2017   Influenza,inj,Quad PF,6+ Mos 12/18/2013   Moderna Sars-Covid-2 Vaccination 05/01/2019, 05/29/2019, 02/18/2020    TDAP status: Due, Education has been provided regarding the importance of this vaccine. Advised may receive this vaccine at local pharmacy or Health Dept. Aware to provide a copy of the vaccination record if obtained from local pharmacy or Health Dept. Verbalized  acceptance and understanding.  Flu Vaccine status: Declined, Education has been provided regarding the importance of this vaccine but patient still declined. Advised may receive this vaccine at local pharmacy or Health Dept. Aware to provide a copy of the vaccination record if obtained from local pharmacy or Health Dept. Verbalized acceptance and understanding.  Pneumococcal vaccine status: Declined,  Education has been provided regarding the importance of this vaccine but patient still declined. Advised may receive this vaccine at local pharmacy or Health Dept. Aware to provide a copy of the vaccination  record if obtained from local pharmacy or Health Dept. Verbalized acceptance and understanding.   Covid-19 vaccine status: Completed vaccines  Qualifies for Shingles Vaccine? Yes   Zostavax completed No   Shingrix Completed?: No.    Education has been provided regarding the importance of this vaccine. Patient has been advised to call insurance company to determine out of pocket expense if they have not yet received this vaccine. Advised may also receive vaccine at local pharmacy or Health Dept. Verbalized acceptance and understanding.  Screening Tests Health Maintenance  Topic Date Due   Hepatitis C Screening  Never done   Zoster Vaccines- Shingrix (1 of 2) Never done   Pneumonia Vaccine 74+ Years old (1 - PCV) Never done   COVID-19 Vaccine (4 - Booster for Moderna series) 04/14/2020   COLONOSCOPY (Pts 45-39yr Insurance coverage will need to be confirmed)  09/30/2021 (Originally [birth year]18[birth year]1995)   TETANUS/TDAP  09/30/2021 (Originally 12/17/1969)   OPHTHALMOLOGY EXAM  08/25/2021   MAMMOGRAM  09/19/2021   INFLUENZA VACCINE  09/21/2021   FOOT EXAM  09/30/2021   HEMOGLOBIN A1C  11/23/2021   DEXA SCAN  07/18/2022   HPV VACCINES  Aged Out    Health Maintenance  Health Maintenance Due  Topic Date Due   Hepatitis C Screening  Never done   Zoster Vaccines- Shingrix (1 of 2) Never done   Pneumonia Vaccine 71 Years old (1 - PCV) Never done   COVID-19 Vaccine (4 - Booster for Moderna series) 04/14/2020    Declines Colonoscopy at this time, but plans to do this before age 71 Mammogram status: Completed 09/19/2020. Repeat every year  Bone Density status: Completed 07/17/2020. Results reflect: Bone density results: OSTEOPOROSIS. Repeat every 2 years.  Lung Cancer Screening: (Low Dose CT Chest recommended if Age 817-80years, 30 pack-year currently smoking OR have quit w/in 15years.) does not qualify  Additional Screening:  Hepatitis C Screening: does qualify ; DUE  Vision Screening:  Recommended annual ophthalmology exams for early detection of glaucoma and other disorders of the eye. Is the patient up to date with their annual eye exam?  No  Who is the provider or what is the name of the office in which the patient attends annual eye exams? none If pt is not established with a provider, would they like to be referred to a provider to establish care? No .   Dental Screening: Recommended annual dental exams for proper oral hygiene  Community Resource Referral / Chronic Care Management: CRR required this visit?  No   CCM required this visit?  No      Plan:     I have personally reviewed and noted the following in the patient's chart:   Medical and social history Use of alcohol, tobacco or illicit drugs  Current medications and supplements including opioid prescriptions.  Functional ability and status Nutritional status Physical activity Advanced directives List of other physicians Hospitalizations, surgeries, and ER visits in previous  12 months Vitals Screenings to include cognitive, depression, and falls Referrals and appointments  In addition, I have reviewed and discussed with patient certain preventive protocols, quality metrics, and best practice recommendations. A written personalized care plan for preventive services as well as general preventive health recommendations were provided to patient.     Sandrea Hammond, LPN   4/40/3474   Nurse Notes: She has had a constant headache in occipital area for about a week - 5-6 on scal of 0-10 - made appt for tomorrow with pcp.

## 2021-07-21 ENCOUNTER — Ambulatory Visit (INDEPENDENT_AMBULATORY_CARE_PROVIDER_SITE_OTHER): Payer: Medicare HMO

## 2021-07-21 ENCOUNTER — Ambulatory Visit (INDEPENDENT_AMBULATORY_CARE_PROVIDER_SITE_OTHER): Payer: Medicare HMO | Admitting: Family Medicine

## 2021-07-21 ENCOUNTER — Encounter: Payer: Self-pay | Admitting: Family Medicine

## 2021-07-21 VITALS — BP 134/74 | HR 55 | Temp 97.5°F | Resp 20 | Ht 63.0 in | Wt 246.0 lb

## 2021-07-21 DIAGNOSIS — R600 Localized edema: Secondary | ICD-10-CM

## 2021-07-21 DIAGNOSIS — M542 Cervicalgia: Secondary | ICD-10-CM

## 2021-07-21 DIAGNOSIS — G44209 Tension-type headache, unspecified, not intractable: Secondary | ICD-10-CM

## 2021-07-21 MED ORDER — METHOCARBAMOL 500 MG PO TABS: 250.0000 mg | ORAL_TABLET | Freq: Two times a day (BID) | ORAL | 0 refills | Status: DC | PRN

## 2021-07-21 NOTE — Progress Notes (Signed)
Subjective: [id number] PCP: Janora Norlander, DO JQB:HALPF Amber Reeves is a 71 y.o. female presenting to clinic today for:  1. Headache Patient reports that she has been having a headache at the posterior and base of the head.  She has had similar in the past but they seem to be more frequent over the last several weeks.  No associated photophobia, phonophobia, nausea, vomiting, visual disturbance.  She does occasionally have numbness and tingling of the left upper extremity but this has been ongoing for months.  No associated weakness.  The headache is described as burning.  She has been using Advil and heat in this area.  She admits to decreased active range of motion of the C-spine.  Her balance is chronically impaired but seems to be worse when she is having a bad headache.  2.  Lower extremity edema Patient reports that she is been utilizing up to 40 mg of Lasix with little improvement lower extremity edema.  She denies any changes in diet over the last several weeks.  She reports that left lower extremity is affected more than right.  She does have a history of DVT but does not report any erythema, warmth etc.  She admits to sitting a lot.  Legs can be painful at times bilaterally.  She "tries to avoid salt" but admits to eating bags of chips recently.   ROS: Per HPI  Allergies  Allergen Reactions   Aspartame Rash   Penicillins Itching and Rash    Did it involve swelling of the face/tongue/throat, SOB, or low BP? No Did it involve sudden or severe rash/hives, skin peeling, or any reaction on the inside of your mouth or nose? No Did you need to seek medical attention at a hospital or doctor's office? No When did it last happen?~25 years ago       If all above answers are "NO", may proceed with cephalosporin use.    Farxiga [Dapagliflozin]     UTIs   Metformin And Related     GI distress, dizziness   No Healthtouch Food Allergies     Honey-Rash Artificial Sweeteners-rash    Paxil [Paroxetine Hcl] Other (See Comments)    Twitching/feels like skin crawling   Prozac [Fluoxetine Hcl] Other (See Comments)    Twitching/feels like skin crawling   Zoloft [Sertraline Hcl] Other (See Comments)    Twitching/feels like skin crawling   Prednisone Palpitations   Ranexa [Ranolazine] Palpitations    Heart racing   Past Medical History:  Diagnosis Date   Allergy    Anxiety    Arthritis    Asthma    Depression    Diabetes mellitus without complication (HCC)    GERD (gastroesophageal reflux disease)    Hyperlipidemia    Hypertension     Current Outpatient Medications:    albuterol (VENTOLIN HFA) 108 (90 Base) MCG/ACT inhaler, TAKE 2 PUFFS BY MOUTH EVERY 4 HOURS AS NEEDED FOR WHEEZE, Disp: 18 g, Rfl: 3   Alcohol Swabs (B-D SINGLE USE SWABS REGULAR) PADS, Apply topically., Disp: , Rfl:    amLODipine (NORVASC) 10 MG tablet, Take 1 tablet (10 mg total) by mouth daily., Disp: 90 tablet, Rfl: 3   aspirin EC 81 MG tablet, Take 81 mg by mouth in the morning., Disp: , Rfl:    atorvastatin (LIPITOR) 40 MG tablet, Take 1 tablet (40 mg total) by mouth at bedtime., Disp: 90 tablet, Rfl: 3   Blood Glucose Calibration (ACCU-CHEK AVIVA) SOLN, 2 Bottles by Other  route as needed., Disp: , Rfl:    Budeson-Glycopyrrol-Formoterol (BREZTRI AEROSPHERE) 160-9-4.8 MCG/ACT AERO, Inhale 2 puffs into the lungs in the morning and at bedtime., Disp: 10.7 g, Rfl: 5   Calcium Carb-Cholecalciferol (CALCIUM 600+D3 PO), Take 1 tablet by mouth in the morning., Disp: , Rfl:    cetirizine (ZYRTEC ALLERGY) 10 MG tablet, Take 1 tablet (10 mg total) by mouth daily., Disp: 90 tablet, Rfl: 1   diphenhydrAMINE (BENADRYL) 25 MG tablet, Take 50 mg by mouth at bedtime., Disp: , Rfl:    docusate sodium (COLACE) 100 MG capsule, Take 100 mg by mouth 2 (two) times daily as needed (constipation.). , Disp: , Rfl:    fluticasone (FLONASE) 50 MCG/ACT nasal spray, Place 2 sprays into both nostrils daily., Disp: 16 g, Rfl:  6   furosemide (LASIX) 20 MG tablet, Take 1 tablet (20 mg total) by mouth as needed (based on weight - may take one extra tab for weight gain of 3lb in 24 hrs or 5lb in 1 week)., Disp: , Rfl:    glucose blood test strip, TEST BLOOD SUGAR TWICE DAILY, Disp: , Rfl:    ibuprofen (ADVIL) 200 MG tablet, Take 400 mg by mouth daily as needed (pain (leg pain))., Disp: , Rfl:    insulin degludec (TRESIBA FLEXTOUCH) 200 UNIT/ML FlexTouch Pen, Inject 40 Units into the skin daily. (Patient taking differently: Inject 38 Units into the skin in the morning.), Disp: 18 mL, Rfl: 0   Insulin Syringe-Needle U-100 31G X 5/16" 0.5 ML MISC, Use to inject Insulin four times daily: Use as directed; Dx: E11.9, E66.9, Disp: , Rfl:    ipratropium-albuterol (DUONEB) 0.5-2.5 (3) MG/3ML SOLN, TAKE 3 ML (1 VIAL) BY NEBULIZATION EVERY 6 HOURS AS NEEDED FOR WHEEZING., Disp: 360 mL, Rfl: 0   isosorbide mononitrate (IMDUR) 60 MG 24 hr tablet, Take 1 tablet (60 mg total) by mouth 2 (two) times daily., Disp: 180 tablet, Rfl: 2   lansoprazole (PREVACID) 15 MG capsule, Take 30 mg by mouth at bedtime., Disp: , Rfl:    lisinopril (ZESTRIL) 40 MG tablet, TAKE 1 TABLET EVERY DAY, Disp: 90 tablet, Rfl: 0   magnesium oxide (MAG-OX) 400 MG tablet, Take magnesium on days you take lasix, Disp: 60 tablet, Rfl: 3   metoprolol tartrate (LOPRESSOR) 25 MG tablet, Take 1 tablet (25 mg total) by mouth 2 (two) times daily., Disp: 180 tablet, Rfl: 1   montelukast (SINGULAIR) 10 MG tablet, Take 1 tablet (10 mg total) by mouth at bedtime., Disp: 90 tablet, Rfl: 3   nitroGLYCERIN (NITROSTAT) 0.4 MG SL tablet, Place 1 tablet (0.4 mg total) under the tongue every 5 (five) minutes as needed for chest pain. (Patient not taking: Reported on 07/20/2021), Disp: 25 tablet, Rfl: 3   pioglitazone (ACTOS) 30 MG tablet, TAKE 1 TABLET BY MOUTH EVERY DAY, Disp: 90 tablet, Rfl: 0   potassium chloride (KLOR-CON M10) 10 MEQ tablet, TAKE 1 TABLET (10 MEQ TOTAL) BY MOUTH EVERY  EVENING., Disp: 90 tablet, Rfl: 0  Current Facility-Administered Medications:    sodium chloride flush (NS) 0.9 % injection 3 mL, 3 mL, Intravenous, Q12H, Branch, Alphonse Guild, MD Social History   Socioeconomic History   Marital status: Married    Spouse name: Fritz Pickerel   Number of children: 3   Years of education: Not on file   Highest education level: Not on file  Occupational History   Occupation: retired  Tobacco Use   Smoking status: Never   Smokeless tobacco: Never  Vaping Use   Vaping Use: Never used  Substance and Sexual Activity   Alcohol use: Never   Drug use: Never   Sexual activity: Not Currently  Other Topics Concern   Not on file  Social History Narrative   Resides at home with her husband.  She is originally from [place] but relocated to [place] and then to [place] in August 2020.  She has a son who lives about 2 hours away in [place] and a sister-in-law who lives in [place].   2 children in [place]   Social Determinants of Health   Financial Resource Strain: Low Risk    Difficulty of Paying Living Expenses: Not hard at all  Food Insecurity: No Food Insecurity   Worried About Charity fundraiser in the Last Year: Never true   Arboriculturist in the Last Year: Never true  Transportation Needs: No Transportation Needs   Lack of Transportation (Medical): No   Lack of Transportation (Non-Medical): No  Physical Activity: Inactive   Days of Exercise per Week: 0 days   Minutes of Exercise per Session: 0 min  Stress: No Stress Concern Present   Feeling of Stress : Only a little  Social Connections: Moderately Isolated   Frequency of Communication with Friends and Family: More than three times a week   Frequency of Social Gatherings with Friends and Family: Once a week   Attends Religious Services: Never   Marine scientist or Organizations: No   Attends Music therapist: Never   Marital Status: Married  Human resources officer Violence: Not At  Risk   Fear of Current or Ex-Partner: No   Emotionally Abused: No   Physically Abused: No   Sexually Abused: No   Family History  Problem Relation Age of Onset   Heart disease Mother    Stroke Mother    Hypertension Mother    Cancer Father    Stroke Father     Objective: Office vital signs reviewed. BP 134/74   Pulse (!) 55   Temp (!) 97.5 F (36.4 C) (Temporal)   Resp 20   Ht 5' 3"  (1.6 m)   Wt 246 lb (111.6 kg)   SpO2 98%   BMI 43.58 kg/m   Physical Examination:  General: Awake, alert, morbidly obese, No acute distress HEENT: Sclera white.  Moist mucous membranes Cardio: Bradycardic with regular rhythm Pulm:  normal work of breathing on room air Extremities: Negative Homans' sign.  No palpable cords along the calves.  She has +1 pitting edema bilaterally. Neuro: PERRLA, EOMI.  Cranial nerves II through XII grossly intact.  Normal upper extremity cerebellar testing. 5/5 UE strength.  Light touch sensation grossly intact MSK: Very limited active range of motion in extension and rotation.  She is able to touch chin to chest but this is painful.  No midline tenderness palpation.  She has quite a bit of increased tonicity of the trapezius muscles left greater than right.  DG Cervical Spine 2 or 3 views  Result Date: 07/21/2021 CLINICAL DATA:  Neck pain and radiculopathy. EXAM: CERVICAL SPINE - 2-3 VIEW COMPARISON:  None Available. FINDINGS: Minimal grade 1 anterolisthesis of C3-4 is noted secondary to posterior facet joint hypertrophy. Mild grade 1 anterolisthesis of C5-6 is noted secondary to posterior facet joint hypertrophy. Moderate degenerative disc disease is noted at C6-7. No fracture is noted. No prevertebral soft tissue swelling is noted. IMPRESSION: Multilevel degenerative changes as described above. No acute abnormality seen. Electronically  Signed   By: Marijo Conception M.D.   On: 07/21/2021 13:47     Assessment/ Plan: 71 y.o. female   Tension headache - Plan: DG  Cervical Spine 2 or 3 views, Ambulatory referral to Physical Therapy, methocarbamol (ROBAXIN) 500 MG tablet  Neck pain - Plan: DG Cervical Spine 2 or 3 views, Ambulatory referral to Physical Therapy, methocarbamol (ROBAXIN) 500 MG tablet  Bilateral leg edema - Plan: TSH, CMP14+EGFR, CBC, Brain natriuretic peptide, D-dimer, quantitative  Morbid obesity (HCC)  I suspect that the posterior pain is likely tension headache in the setting of spasm within the C-spine.  Her exam was notable for decreased range of motion as well as increased tonicity of the trapezius muscle.  I am going to refer her to physical therapy.  She may continue heat.  Muscle relaxer was added.  Caution sedation.  Plain films demonstrated degenerative changes which is what we expected given the radicular symptoms along the left upper extremity.  Certainly no evidence of stroke or other intracranial processes.  Like her to follow-up in 6 weeks and if ongoing symptoms and/or worsening prior to that time we will plan for referral to orthopedics or spinal specialist  Leg edema is likely secondary to chronic venous stasis, morbid obesity and use of amlodipine.  Would certainly describe this as mild pitting edema.  No evidence of acute DVT but I am going to order a D-dimer given her history of this.  BMP collected.  May need to consider referral to vascular and/or lymphedema clinic if has ongoing symptoms that are refractory to use of diuretic.  Again counseled on DASH diet, increase mobility, elevation of lower extremities etc.  No orders of the defined types were placed in this encounter.  No orders of the defined types were placed in this encounter.    Janora Norlander, DO [facility] [phone]

## 2021-07-21 NOTE — Patient Instructions (Addendum)
Tension Headache, Adult A tension headache is a feeling of pain, pressure, or aching in the head. It is often felt over the front and sides of the head. Tension headaches can last from 30 minutes to several days. What are the causes? The cause of this condition is not known. Sometimes, tension headaches are brought on by stress, worry (anxiety), or depression. Other things that may set them off include: Alcohol. Too much caffeine or caffeine withdrawal. Colds, flu, or sinus infections. Dental problems. This can include clenching your teeth. Being tired. Holding your head and neck in the same position for a long time, such as while using a computer. Smoking. Arthritis in the neck. What are the signs or symptoms? Feeling pressure around the head. A dull ache in the head. Pain over the front and sides of the head. Feeling sore or tender in the muscles of the head, neck, and shoulders. How is this treated? This condition may be treated with lifestyle changes and with medicines that help relieve symptoms. Follow these instructions at home: Managing pain Take over-the-counter and prescription medicines only as told by your doctor. When you have a headache, lie down in a dark, quiet room. If told, put ice on your head and neck. To do this: Put ice in a plastic bag. Place a towel between your skin and the bag. Leave the ice on for 20 minutes, 2-3 times a day. Take off the ice if your skin turns bright red. This is very important. If you cannot feel pain, heat, or cold, you have a greater risk of damage to the area. If told, put heat on the back of your neck. Do this as often as told by your doctor. Use the heat source that your doctor recommends, such as a moist heat pack or a heating pad. Place a towel between your skin and the heat source. Leave the heat on for 20-30 minutes. Take off the heat if your skin turns bright red. This is very important. If you cannot feel pain, heat, or cold, you  have a greater risk of getting burned. Eating and drinking Eat meals on a regular schedule. If you drink alcohol: Limit how much you have to: 0-1 drink a day for women who are not pregnant. 0-2 drinks a day for men. Know how much alcohol is in your drink. In the [place] one drink equals one 12 oz bottle of beer (355 mL), one 5 oz glass of wine (148 mL), or one 1 oz glass of hard liquor (44 mL). Drink enough fluid to keep your pee (urine) pale yellow. Do not use a lot of caffeine, or stop using caffeine. Lifestyle Get 7-9 hours of sleep each night. Or get the amount of sleep that your doctor tells you to. At bedtime, keep computers, phones, and tablets out of your room. Find ways to lessen your stress. This may include: Exercise. Deep breathing. Yoga. Listening to music. Thinking positive thoughts. Sit up straight. Try to relax your muscles. Do not smoke or use any products that contain nicotine or tobacco. If you need help quitting, ask your doctor. General instructions  Avoid things that can bring on headaches. Keep a headache journal to see what may bring on headaches. For example, write down: What you eat and drink. How much sleep you get. Any change to your diet or medicines. Keep all follow-up visits. Contact a doctor if: Your headache does not get better. Your headache comes back. You have a headache, and sounds,  light, or smells bother you. You feel like you may vomit, or you vomit. Your stomach hurts. Get help right away if: You all of a sudden get a very bad headache with any of these things: A stiff neck. Feeling like you may vomit. Vomiting. Feeling mixed up (confused). Feeling weak in one part or one side of your body. Having trouble seeing or speaking, or both. Feeling short of breath. A rash. Feeling very sleepy. Pain in your eye or ear. Trouble walking or balancing. Feeling like you will faint, or you faint. Summary A tension headache is pain, pressure,  or aching in your head. Tension headaches can last from 30 minutes to several days. Lifestyle changes and medicines may help relieve pain. This information is not intended to replace advice given to you by your health care provider. Make sure you discuss any questions you have with your health care provider. Document Revised: 11/07/2019 Document Reviewed: 11/07/2019 Elsevier Patient Education  [employer].

## 2021-07-22 ENCOUNTER — Telehealth: Payer: Self-pay

## 2021-07-22 LAB — CMP14+EGFR
ALT: 15 IU/L (ref 0–32)
AST: 13 IU/L (ref 0–40)
Albumin/Globulin Ratio: 1.2 (ref 1.2–2.2)
Albumin: 3.9 g/dL (ref 3.8–4.8)
Alkaline Phosphatase: 45 IU/L (ref 44–121)
BUN/Creatinine Ratio: 18 (ref 12–28)
BUN: 26 mg/dL (ref 8–27)
Bilirubin Total: 0.6 mg/dL (ref 0.0–1.2)
CO2: 23 mmol/L (ref 20–29)
Calcium: 9.2 mg/dL (ref 8.7–10.3)
Chloride: 102 mmol/L (ref 96–106)
Creatinine, Ser: 1.42 mg/dL — ABNORMAL HIGH (ref 0.57–1.00)
Globulin, Total: 3.3 g/dL (ref 1.5–4.5)
Glucose: 87 mg/dL (ref 70–99)
Potassium: 4.9 mmol/L (ref 3.5–5.2)
Sodium: 141 mmol/L (ref 134–144)
Total Protein: 7.2 g/dL (ref 6.0–8.5)
eGFR: 40 mL/min/{1.73_m2} — ABNORMAL LOW (ref >59)

## 2021-07-22 LAB — CBC
Hematocrit: 35.6 % (ref 34.0–46.6)
Hemoglobin: 11.8 g/dL (ref 11.1–15.9)
MCH: 32.1 pg (ref 26.6–33.0)
MCHC: 33.1 g/dL (ref 31.5–35.7)
MCV: 97 fL (ref 79–97)
Platelets: 195 10*3/uL (ref 150–450)
RBC: 3.68 x10E6/uL — ABNORMAL LOW (ref 3.77–5.28)
RDW: 13.1 % (ref 11.7–15.4)
WBC: 5.7 10*3/uL (ref 3.4–10.8)

## 2021-07-22 LAB — TSH: TSH: 5.13 u[IU]/mL — ABNORMAL HIGH (ref 0.450–4.500)

## 2021-07-22 LAB — D-DIMER, QUANTITATIVE: D-DIMER: 0.57 mg/L FEU — ABNORMAL HIGH (ref 0.00–0.49)

## 2021-07-22 LAB — BRAIN NATRIURETIC PEPTIDE: BNP: 95.5 pg/mL (ref 0.0–100.0)

## 2021-07-22 NOTE — Chronic Care Management (AMB) (Signed)
  Chronic Care Management Note  07/22/2021 Name: Amber Reeves MRN: [id number] DOB: [birth year]  Amber Reeves is a 71 y.o. year old female who is a primary care patient of Janora Norlander, DO and is actively engaged with the care management team. I reached out to Celesta Aver by phone today to assist with scheduling a follow up visit with the Pharmacist  Follow up plan: Face to Face appointment with care management team member scheduled for: 08/25/2021  Noreene Larsson, [place], [place], [place] [address] Direct Dial: [phone] [email or web address] Website: [email or web address]

## 2021-07-25 ENCOUNTER — Emergency Department (HOSPITAL_COMMUNITY)
Admission: EM | Admit: 2021-07-25 | Discharge: 2021-07-25 | Disposition: A | Discharge status: Home / Self Care | Payer: Medicare HMO | Attending: Emergency Medicine | Admitting: Emergency Medicine

## 2021-07-25 ENCOUNTER — Encounter (HOSPITAL_COMMUNITY): Payer: Self-pay | Admitting: Emergency Medicine

## 2021-07-25 ENCOUNTER — Other Ambulatory Visit: Payer: Self-pay

## 2021-07-25 DIAGNOSIS — I959 Hypotension, unspecified: Secondary | ICD-10-CM | POA: Diagnosis not present

## 2021-07-25 DIAGNOSIS — I10 Essential (primary) hypertension: Secondary | ICD-10-CM | POA: Insufficient documentation

## 2021-07-25 DIAGNOSIS — R42 Dizziness and giddiness: Secondary | ICD-10-CM | POA: Diagnosis not present

## 2021-07-25 DIAGNOSIS — Z79899 Other long term (current) drug therapy: Secondary | ICD-10-CM | POA: Diagnosis not present

## 2021-07-25 DIAGNOSIS — R0689 Other abnormalities of breathing: Secondary | ICD-10-CM | POA: Diagnosis not present

## 2021-07-25 DIAGNOSIS — Z794 Long term (current) use of insulin: Secondary | ICD-10-CM | POA: Insufficient documentation

## 2021-07-25 DIAGNOSIS — E860 Dehydration: Secondary | ICD-10-CM | POA: Diagnosis not present

## 2021-07-25 DIAGNOSIS — R197 Diarrhea, unspecified: Secondary | ICD-10-CM | POA: Diagnosis not present

## 2021-07-25 DIAGNOSIS — E119 Type 2 diabetes mellitus without complications: Secondary | ICD-10-CM | POA: Diagnosis not present

## 2021-07-25 DIAGNOSIS — Z7982 Long term (current) use of aspirin: Secondary | ICD-10-CM | POA: Diagnosis not present

## 2021-07-25 LAB — CBC
HCT: 37.9 % (ref 36.0–46.0)
Hemoglobin: 12 g/dL (ref 12.0–15.0)
MCH: 32.5 pg (ref 26.0–34.0)
MCHC: 31.7 g/dL (ref 30.0–36.0)
MCV: 102.7 fL — ABNORMAL HIGH (ref 80.0–100.0)
Platelets: 171 10*3/uL (ref 150–400)
RBC: 3.69 MIL/uL — ABNORMAL LOW (ref 3.87–5.11)
RDW: 14 % (ref 11.5–15.5)
WBC: 4.6 10*3/uL (ref 4.0–10.5)
nRBC: 0 % (ref 0.0–0.2)

## 2021-07-25 LAB — CBG MONITORING, ED: Glucose-Capillary: 98 mg/dL (ref 70–99)

## 2021-07-25 LAB — BASIC METABOLIC PANEL
Anion gap: 7 (ref 5–15)
BUN: 24 mg/dL — ABNORMAL HIGH (ref 8–23)
CO2: 23 mmol/L (ref 22–32)
Calcium: 8.4 mg/dL — ABNORMAL LOW (ref 8.9–10.3)
Chloride: 107 mmol/L (ref 98–111)
Creatinine, Ser: 1.31 mg/dL — ABNORMAL HIGH (ref 0.44–1.00)
GFR, Estimated: 44 mL/min — ABNORMAL LOW (ref >60)
Glucose, Bld: 85 mg/dL (ref 70–99)
Potassium: 3.8 mmol/L (ref 3.5–5.1)
Sodium: 137 mmol/L (ref 135–145)

## 2021-07-25 LAB — HEPATIC FUNCTION PANEL
ALT: 16 U/L (ref 0–44)
AST: 18 U/L (ref 15–41)
Albumin: 3.3 g/dL — ABNORMAL LOW (ref 3.5–5.0)
Alkaline Phosphatase: 36 U/L — ABNORMAL LOW (ref 38–126)
Bilirubin, Direct: 0.1 mg/dL (ref 0.0–0.2)
Indirect Bilirubin: 0.7 mg/dL (ref 0.3–0.9)
Total Bilirubin: 0.8 mg/dL (ref 0.3–1.2)
Total Protein: 5.9 g/dL — ABNORMAL LOW (ref 6.5–8.1)

## 2021-07-25 LAB — TROPONIN I (HIGH SENSITIVITY): Troponin I (High Sensitivity): 4 ng/L (ref <18)

## 2021-07-25 MED ORDER — SODIUM CHLORIDE 0.9 % IV BOLUS
500.0000 mL | Freq: Once | INTRAVENOUS | Status: AC
  Administered 2021-07-25: 500 mL via INTRAVENOUS

## 2021-07-25 MED ORDER — ACETAMINOPHEN 325 MG PO TABS
650.0000 mg | ORAL_TABLET | Freq: Once | ORAL | Status: AC
Start: 2021-07-25 — End: 2021-07-25
  Administered 2021-07-25: 650 mg via ORAL
  Filled 2021-07-25: qty 2

## 2021-07-25 NOTE — ED Provider Notes (Signed)
[facility] EMERGENCY DEPARTMENT Provider Note   CSN: [id number] Arrival date & time: 07/25/21  1152     History {Add pertinent medical, surgical, social history, OB history to HPI:1} Chief Complaint  Patient presents with   Dizziness    Amber Reeves is a 71 y.o. female.  Patient complains of dizziness.  Patient has a history of diabetes hypertension.  Patient has had this dizziness a couple other times and has been evaluated thoroughly by cardiology with cardiac cath.   Dizziness     Home Medications Prior to Admission medications   Medication Sig Start Date End Date Taking? Authorizing Provider  albuterol (VENTOLIN HFA) 108 (90 Base) MCG/ACT inhaler TAKE 2 PUFFS BY MOUTH EVERY 4 HOURS AS NEEDED FOR WHEEZE 02/11/21  Yes Ronnie Doss M, DO  Alcohol Swabs (B-D SINGLE USE SWABS REGULAR) PADS Apply topically. 07/29/15  Yes [provider]  amLODipine (NORVASC) 10 MG tablet Take 1 tablet (10 mg total) by mouth daily. 03/15/21 07/25/21 Yes BranchAlphonse Guild, MD  aspirin EC 81 MG tablet Take 81 mg by mouth in the morning.   Yes [provider]  atorvastatin (LIPITOR) 40 MG tablet Take 1 tablet (40 mg total) by mouth at bedtime. 09/30/20  Yes Gottschalk, Leatrice Jewels M, DO  Budeson-Glycopyrrol-Formoterol (BREZTRI AEROSPHERE) 160-9-4.8 MCG/ACT AERO Inhale 2 puffs into the lungs in the morning and at bedtime. 06/14/21  Yes Chesley Mires, MD  Calcium Carb-Cholecalciferol (CALCIUM 600+D3 PO) Take 1 tablet by mouth in the morning.   Yes [provider]  cetirizine (ZYRTEC ALLERGY) 10 MG tablet Take 1 tablet (10 mg total) by mouth daily. 06/24/21  Yes Hawks, Christy A, FNP  diphenhydrAMINE (BENADRYL) 25 MG tablet Take 50 mg by mouth at bedtime.   Yes [provider]  fluticasone (FLONASE) 50 MCG/ACT nasal spray Place 2 sprays into both nostrils daily. 06/24/21  Yes Hawks, Christy A, FNP  furosemide (LASIX) 20 MG tablet Take 1 tablet (20 mg total) by mouth as needed  (based on weight - may take one extra tab for weight gain of 3lb in 24 hrs or 5lb in 1 week). 10/06/20  Yes Verta Ellen., NP  ibuprofen (ADVIL) 200 MG tablet Take 400 mg by mouth daily as needed (pain (leg pain)).   Yes [provider]  insulin degludec (TRESIBA FLEXTOUCH) 200 UNIT/ML FlexTouch Pen Inject 40 Units into the skin daily. Patient taking differently: Inject 38 Units into the skin in the morning. 04/24/20  Yes Gottschalk, Ashly M, DO  ipratropium-albuterol (DUONEB) 0.5-2.5 (3) MG/3ML SOLN TAKE 3 ML (1 VIAL) BY NEBULIZATION EVERY 6 HOURS AS NEEDED FOR WHEEZING. Patient taking differently: Take 3 mLs by nebulization every 6 (six) hours as needed (wheezing). 02/16/21  Yes Ronnie Doss M, DO  isosorbide mononitrate (IMDUR) 60 MG 24 hr tablet Take 1 tablet (60 mg total) by mouth 2 (two) times daily. 03/17/21  Yes BranchAlphonse Guild, MD  lansoprazole (PREVACID) 15 MG capsule Take 30 mg by mouth at bedtime.   Yes [provider]  lisinopril (ZESTRIL) 40 MG tablet TAKE 1 TABLET EVERY DAY 03/31/21  Yes Ronnie Doss M, DO  magnesium oxide (MAG-OX) 400 MG tablet Take magnesium on days you take lasix 09/30/20  Yes Gottschalk, Ashly M, DO  methocarbamol (ROBAXIN) 500 MG tablet Take 0.5-1 tablets (250-500 mg total) by mouth 2 (two) times daily as needed for muscle spasms (neck pain). 07/21/21  Yes Gottschalk, Leatrice Jewels M, DO  metoprolol tartrate (LOPRESSOR) 25 MG tablet  Take 1 tablet (25 mg total) by mouth 2 (two) times daily. 06/03/21  Yes Gottschalk, Ashly M, DO  montelukast (SINGULAIR) 10 MG tablet Take 1 tablet (10 mg total) by mouth at bedtime. 01/08/21  Yes Gottschalk, Leatrice Jewels M, DO  nitroGLYCERIN (NITROSTAT) 0.4 MG SL tablet Place 1 tablet (0.4 mg total) under the tongue every 5 (five) minutes as needed for chest pain. 06/11/20 09/17/22 Yes Verta Ellen., NP  pioglitazone (ACTOS) 30 MG tablet TAKE 1 TABLET BY MOUTH EVERY DAY 07/09/21  Yes Gottschalk, Ashly M, DO  potassium  chloride (KLOR-CON M10) 10 MEQ tablet TAKE 1 TABLET (10 MEQ TOTAL) BY MOUTH EVERY EVENING. 04/18/21  Yes Gottschalk, Ashly M, DO  Blood Glucose Calibration (ACCU-CHEK AVIVA) SOLN 2 Bottles by Other route as needed. 07/29/15   [provider]  glucose blood test strip TEST BLOOD SUGAR TWICE DAILY 10/03/18   [provider]  Insulin Syringe-Needle U-100 31G X 5/16" 0.5 ML MISC Use to inject Insulin four times daily: Use as directed; Dx: E11.9, E66.9 10/03/18   [provider]      Allergies    Aspartame, Penicillins, Farxiga [dapagliflozin], Metformin and related, No healthtouch food allergies, Paxil [paroxetine hcl], Prozac [fluoxetine hcl], Zoloft [sertraline hcl], Prednisone, and Ranexa [ranolazine]    Review of Systems   Review of Systems  Neurological:  Positive for dizziness.   Physical Exam Updated Vital Signs BP 104/64   Pulse (!) 57   Temp 97.6 F (36.4 C) (Oral)   Resp 18   Ht 5' 3"  (1.6 m)   Wt 111.6 kg   SpO2 97%   BMI 43.58 kg/m  Physical Exam  ED Results / Procedures / Treatments   Labs (all labs ordered are listed, but only abnormal results are displayed) Labs Reviewed  BASIC METABOLIC PANEL - Abnormal; Notable for the following components:      Result Value   BUN 24 (*)    Creatinine, Ser 1.31 (*)    Calcium 8.4 (*)    GFR, Estimated 44 (*)    All other components within normal limits  CBC - Abnormal; Notable for the following components:   RBC 3.69 (*)    MCV 102.7 (*)    All other components within normal limits  HEPATIC FUNCTION PANEL - Abnormal; Notable for the following components:   Total Protein 5.9 (*)    Albumin 3.3 (*)    Alkaline Phosphatase 36 (*)    All other components within normal limits  URINALYSIS, ROUTINE W REFLEX MICROSCOPIC  CBG MONITORING, ED  TROPONIN I (HIGH SENSITIVITY)    EKG EKG Interpretation  Date/Time:  Sunday July 25 2021 11:59:32 EDT Ventricular Rate:  57 PR Interval:  166 QRS  Duration: 99 QT Interval:  453 QTC Calculation: 442 R Axis:   -34 Text Interpretation: Sinus rhythm Left axis deviation Abnormal R-wave progression, late transition Confirmed by Milton Ferguson [phone]) on 07/25/2021 2:14:29 PM  Radiology No results found.  Procedures Procedures  {Document cardiac monitor, telemetry assessment procedure when appropriate:1}  Medications Ordered in ED Medications  sodium chloride 0.9 % bolus 500 mL (0 mLs Intravenous Stopped 07/25/21 1418)  acetaminophen (TYLENOL) tablet 650 mg (650 mg Oral Given 07/25/21 1233)    ED Course/ Medical Decision Making/ A&P                           Medical Decision Making Amount and/or Complexity of Data Reviewed Labs: ordered.  Risk OTC drugs.   Patient with mild dizziness and dehydration.  She improved with IV fluids and will follow-up with her cardiologist  {Document critical care time when appropriate:1} {Document review of labs and clinical decision tools ie heart score, Chads2Vasc2 etc:1}  {Document your independent review of radiology images, and any outside records:1} {Document your discussion with family members, caretakers, and with consultants:1} {Document social determinants of health affecting pt's care:1} {Document your decision making why or why not admission, treatments were needed:1} Final Clinical Impression(s) / ED Diagnoses Final diagnoses:  Dizziness  Dehydration    Rx / DC Orders ED Discharge Orders     None

## 2021-07-25 NOTE — Discharge Instructions (Signed)
Drink plenty of fluids and rest and follow-up with your cardiologist next week

## 2021-07-25 NOTE — ED Notes (Signed)
Pt used bedside commode and was unable to give a urine sample due to feces.

## 2021-07-25 NOTE — ED Triage Notes (Signed)
Pt to the ED with complaints of dizziness and possible new onset afib, orthostatics positive.

## 2021-07-28 ENCOUNTER — Encounter: Payer: Self-pay | Admitting: Physical Therapy

## 2021-07-28 ENCOUNTER — Ambulatory Visit: Payer: Medicare HMO | Attending: Family Medicine | Admitting: Physical Therapy

## 2021-07-28 DIAGNOSIS — R293 Abnormal posture: Secondary | ICD-10-CM | POA: Diagnosis not present

## 2021-07-28 DIAGNOSIS — G44209 Tension-type headache, unspecified, not intractable: Secondary | ICD-10-CM | POA: Diagnosis not present

## 2021-07-28 DIAGNOSIS — M542 Cervicalgia: Secondary | ICD-10-CM | POA: Insufficient documentation

## 2021-07-28 NOTE — Therapy (Signed)
[facility] [place], [place], [address] Phone: [phone]   Fax:  [phone]  Physical Therapy Evaluation  Patient Details  Name: Amber Reeves MRN: [id number] Date of Birth: 1951/02/18 Referring Provider (PT): Ronnie Doss DO.   Encounter Date: 07/28/2021   PT End of Session - 07/28/21 1506     Visit Number 1    Number of Visits 12    Date for PT Re-Evaluation 09/08/21    Authorization Type FOTO AT LEAST EVERY 5TH VISIT.  PROGRESS NOTE AT 10TH VISIT.  KX MODIFIER AFTER 15 VISITS.    PT Start Time 24    PT Stop Time 0246    PT Time Calculation (min) 55 min    Activity Tolerance Patient tolerated treatment well    Behavior During Therapy WFL for tasks assessed/performed             Past Medical History:  Diagnosis Date   Allergy    Anxiety    Arthritis    Asthma    Depression    Diabetes mellitus without complication ([place])    GERD (gastroesophageal reflux disease)    Hyperlipidemia    Hypertension     Past Surgical History:  Procedure Laterality Date   ABDOMINAL HYSTERECTOMY     BREAST EXCISIONAL BIOPSY     CESAREAN SECTION     3   CHOLECYSTECTOMY  2018   CORONARY STENT PLACEMENT  10/2017   [facility] hospital)   LEFT HEART CATH AND CORONARY ANGIOGRAPHY N/A 02/25/2019   Procedure: LEFT HEART CATH AND CORONARY ANGIOGRAPHY;  Surgeon: Nelva Bush, MD;  Location: [facility];  Service: Cardiovascular;  Laterality: N/A;   REPLACEMENT TOTAL KNEE BILATERAL Bilateral    done in [place] (2003/2005)   RIGHT/LEFT HEART CATH AND CORONARY ANGIOGRAPHY N/A 09/22/2020   Procedure: RIGHT/LEFT HEART CATH AND CORONARY ANGIOGRAPHY;  Surgeon: Leonie Man, MD;  Location: [facility] [facility];  Service: Cardiovascular;  Laterality: N/A;    There were no vitals filed for this visit.    Subjective Assessment - 07/28/21 1524     Subjective The patient presents to the clinic today with  c/o neck pain and headaches over the last several weeks.  She rates her pain at a 7/10.  Her pain increases when looking down.  She has not found anything really decreases her pain at this time.    Pertinent History Blateral total knee replacments, left elbow fracture, recent ED visit for dizziness(see 07/25/21 visit), OA, DM, HTN, coronary artery stent placement, OP.    How long can you sit comfortably? Looking down increases pain.    Diagnostic tests X-ray:  Minimal grade 1 anterolisthesis of C3-4 is noted secondary to  posterior facet joint hypertrophy. Mild grade 1 anterolisthesis of  C5-6 is noted secondary to posterior facet joint hypertrophy.  Moderate degenerative disc disease is noted at C6-7. No fracture is  noted. No prevertebral soft tissue swelling is noted.    Currently in Pain? Yes    Pain Score 7     Pain Location Neck    Pain Orientation Right;Left    Pain Type Acute pain    Pain Onset More than a month ago    Pain Frequency Constant    Aggravating Factors  See baove.    Pain Relieving Factors See above.                [facility] PT Assessment - 07/28/21 0001  Assessment   Medical Diagnosis Tension headache, neck pain.    Referring Provider (PT) Ronnie Doss DO.    Onset Date/Surgical Date --   Several weeks.     Precautions   Precautions --    Precaution Comments OP.  Please supervise gait.      Restrictions   Weight Bearing Restrictions No      Balance Screen   Has the patient fallen in the past 6 months No    Has the patient had a decrease in activity level because of a fear of falling?  Yes   No falls.  Patient rises slowly from a seated position and waits a short time before walking.   Is the patient reluctant to leave their home because of a fear of falling?  No      Home Environment   Living Environment Private residence      Prior Function   Level of Independence Independent      Observation/Other Assessments   Focus on Therapeutic Outcomes  (FOTO)  Complete.      Posture/Postural Control   Posture/Postural Control Postural limitations    Postural Limitations Rounded Shoulders;Forward head      Deep Tendon Reflexes   DTR Assessment Site Biceps;Brachioradialis;Triceps    Biceps DTR 2+    Brachioradialis DTR 2+    Triceps DTR 2+      ROM / Strength   AROM / PROM / Strength AROM;Strength      AROM   Overall AROM Comments Right active cervical rotation is 30 degrees and left is 40 degrees, bilateral sidebending is 15 degrees.      Strength   Overall Strength Comments Normal UE strength.      Palpation   Palpation comment Tender to palpation over patient's upper cervical musculature an dparaspinals as well as bilateral UT's.  She exhibited a significant amount of tone.                        Objective measurements completed on examination: See above findings.       [facility] Adult PT Treatment/Exercise - 07/28/21 0001       Modalities   Modalities Electrical Stimulation;Moist Heat      Moist Heat Therapy   Number Minutes Moist Heat 19 Minutes    Moist Heat Location Cervical      Electrical Stimulation   Electrical Stimulation Location Bilateral cervical.    Electrical Stimulation Action Low-level IFC at 80-150 Hz.    Electrical Stimulation Parameters 40% scan x 20 minutes.    Electrical Stimulation Goals Pain;Tone                          PT Long Term Goals - 07/28/21 1551       PT LONG TERM GOAL #1   Title Independent with a HEP.    Time 6    Period Weeks    Status New      PT LONG TERM GOAL #2   Title Increase active cervical rotation to 60 degrees+ so patient can turn head more easily while driving.    Time 6    Period Weeks    Status New      PT LONG TERM GOAL #3   Title Eliminate headaches.    Time 6    Period Weeks    Status New      PT LONG TERM GOAL #4   Title Perform ADL's  with pain not > 3/10.    Time 6    Period Weeks    Status New                     Plan - 07/28/21 1545     Clinical Impression Statement The patient presents to [facility] with c/o neck paina nd headaches over the last several months.  UE DTR's are normal as is strength.  She has significant losses of active cervical range of motion.  She is tender to palpation diffusely over her cervical musculature which is remarkable for increased tone.  Her ability to perform ADL's is impaired due to pain.  Patient will benefit from skilled physical therapy intervention to address pain and deficits.    Personal Factors and Comorbidities Comorbidity 1;Comorbidity 2;Other    Comorbidities Blateral total knee replacments, left elbow fracture, recent ED visit for dizziness(see 07/25/21 visit), OA, DM, HTN, coronary artery stent placement, OP.    Examination-Activity Limitations Other;Sit    Examination-Participation Restrictions Cleaning;Other    Stability/Clinical Decision Making Stable/Uncomplicated    Clinical Decision Making Low    Rehab Potential Good    PT Frequency 2x / week    PT Duration 6 weeks    PT Treatment/Interventions ADLs/Self Care Home Management;Cryotherapy;Electrical Stimulation;Ultrasound;Moist Heat;Therapeutic activities;Therapeutic exercise;Manual techniques;Patient/family education;Passive range of motion;Dry needling    PT Next Visit Plan Postural exercises, combo e'stim/US, STW/M.    Consulted and Agree with Plan of Care Patient             Patient will benefit from skilled therapeutic intervention in order to improve the following deficits and impairments:  Decreased activity tolerance, Decreased range of motion, Increased muscle spasms, Postural dysfunction, Pain  Visit Diagnosis: Cervicalgia  Abnormal posture     Problem List Patient Active Problem List   Diagnosis Date Noted   Angina, class III ([place])    DOE (dyspnea on exertion)    CAD S/P percutaneous coronary angioplasty    Hyperlipidemia associated with type 2 diabetes mellitus  ([place]) 11/29/2019   Hypertension associated with diabetes ([place]) 11/29/2019   DM type 2 causing vascular disease ([place]) 05/20/2019   Essential hypertension, benign 05/20/2019   Mixed hyperlipidemia 05/20/2019   Coronary artery disease involving native coronary artery of native heart with angina pectoris ([place]) 01/29/2019   Stented coronary artery 02/15/2018   Dizziness 11/20/2017   Stable angina ([place]) 11/19/2017   Bipolar affective disorder ([place]) 08/31/2016   OSA on CPAP 08/31/2016   Morbid obesity due to excess calories ([place]) 01/07/2016   Left sided colitis with rectal bleeding ([place]) 07/15/2015   Environmental allergies 06/02/2014   Moderate persistent asthma without complication 02/23/7251   Depressed 05/01/2013   GERD (gastroesophageal reflux disease) 04/15/2011   History of total knee replacement 04/15/2011   Rationale for Evaluation and Treatment Rehabilitation.  , [place], PT 07/28/2021, 3:56 PM  [facility] [address] [place], [place], [address] Phone: [phone]   Fax:  [phone]  Name: Amber Reeves MRN: [id number] Date of Birth: Jan 22, 1951

## 2021-07-29 ENCOUNTER — Ambulatory Visit (INDEPENDENT_AMBULATORY_CARE_PROVIDER_SITE_OTHER): Payer: Medicare HMO | Admitting: Pulmonary Disease

## 2021-07-29 DIAGNOSIS — R0609 Other forms of dyspnea: Secondary | ICD-10-CM | POA: Diagnosis not present

## 2021-07-29 DIAGNOSIS — J4550 Severe persistent asthma, uncomplicated: Secondary | ICD-10-CM

## 2021-07-29 LAB — PULMONARY FUNCTION TEST
DL/VA % pred: 109 %
DL/VA: 4.57 ml/min/mmHg/L
DLCO cor % pred: 80 %
DLCO cor: 15.08 ml/min/mmHg
DLCO unc % pred: 76 %
DLCO unc: 14.39 ml/min/mmHg
FEF 25-75 Post: 2.14 L/sec
FEF 25-75 Pre: 2.4 L/sec
FEF2575-%Change-Post: -10 %
FEF2575-%Pred-Post: 116 %
FEF2575-%Pred-Pre: 130 %
FEV1-%Change-Post: -1 %
FEV1-%Pred-Post: 82 %
FEV1-%Pred-Pre: 83 %
FEV1-Post: 1.79 L
FEV1-Pre: 1.83 L
FEV1FVC-%Change-Post: 1 %
FEV1FVC-%Pred-Pre: 112 %
FEV6-%Change-Post: -3 %
FEV6-%Pred-Post: 75 %
FEV6-%Pred-Pre: 77 %
FEV6-Post: 2.07 L
FEV6-Pre: 2.13 L
FEV6FVC-%Pred-Post: 104 %
FEV6FVC-%Pred-Pre: 104 %
FVC-%Change-Post: -3 %
FVC-%Pred-Post: 71 %
FVC-%Pred-Pre: 74 %
FVC-Post: 2.07 L
FVC-Pre: 2.13 L
Post FEV1/FVC ratio: 87 %
Post FEV6/FVC ratio: 100 %
Pre FEV1/FVC ratio: 86 %
Pre FEV6/FVC Ratio: 100 %
RV % pred: 75 %
RV: 1.61 L
TLC % pred: 83 %
TLC: 4.07 L

## 2021-07-29 NOTE — Progress Notes (Signed)
PFT done today. 

## 2021-07-30 ENCOUNTER — Ambulatory Visit: Payer: Medicare HMO | Admitting: Physical Therapy

## 2021-07-30 DIAGNOSIS — R293 Abnormal posture: Secondary | ICD-10-CM | POA: Diagnosis not present

## 2021-07-30 DIAGNOSIS — G44209 Tension-type headache, unspecified, not intractable: Secondary | ICD-10-CM | POA: Diagnosis not present

## 2021-07-30 DIAGNOSIS — M542 Cervicalgia: Secondary | ICD-10-CM

## 2021-07-30 NOTE — Therapy (Signed)
[facility] [place], [place], [address] Phone: [phone]   Fax:  [phone]  Physical Therapy Treatment  Patient Details  Name: Amber Reeves MRN: [id number] Date of Birth: 1950-12-11 Referring Provider (PT): Ronnie Doss DO.   Encounter Date: 07/30/2021   PT End of Session - 07/30/21 1035     Visit Number 2    Number of Visits 12    Date for PT Re-Evaluation 09/08/21    Authorization Type FOTO AT LEAST EVERY 5TH VISIT.  PROGRESS NOTE AT 10TH VISIT.  KX MODIFIER AFTER 15 VISITS.    PT Start Time 0932    PT Stop Time 1028    PT Time Calculation (min) 56 min    Activity Tolerance Patient tolerated treatment well    Behavior During Therapy WFL for tasks assessed/performed             Past Medical History:  Diagnosis Date   Allergy    Anxiety    Arthritis    Asthma    Depression    Diabetes mellitus without complication ([place])    GERD (gastroesophageal reflux disease)    Hyperlipidemia    Hypertension     Past Surgical History:  Procedure Laterality Date   ABDOMINAL HYSTERECTOMY     BREAST EXCISIONAL BIOPSY     CESAREAN SECTION     3   CHOLECYSTECTOMY  2018   CORONARY STENT PLACEMENT  10/2017   [facility] hospital)   LEFT HEART CATH AND CORONARY ANGIOGRAPHY N/A 02/25/2019   Procedure: LEFT HEART CATH AND CORONARY ANGIOGRAPHY;  Surgeon: Nelva Bush, MD;  Location: [place] [facility];  Service: Cardiovascular;  Laterality: N/A;   REPLACEMENT TOTAL KNEE BILATERAL Bilateral    done in [place] (2003/2005)   RIGHT/LEFT HEART CATH AND CORONARY ANGIOGRAPHY N/A 09/22/2020   Procedure: RIGHT/LEFT HEART CATH AND CORONARY ANGIOGRAPHY;  Surgeon: Leonie Man, MD;  Location: [facility] [facility];  Service: Cardiovascular;  Laterality: N/A;    There were no vitals filed for this visit.   Subjective Assessment - 07/30/21 1026     Subjective Pain at an 8 today.    Pertinent History Blateral  total knee replacments, left elbow fracture, recent ED visit for dizziness(see 07/25/21 visit), OA, DM, HTN, coronary artery stent placement, OP.    How long can you sit comfortably? Looking down increases pain.    Diagnostic tests X-ray:  Minimal grade 1 anterolisthesis of C3-4 is noted secondary to  posterior facet joint hypertrophy. Mild grade 1 anterolisthesis of  C5-6 is noted secondary to posterior facet joint hypertrophy.  Moderate degenerative disc disease is noted at C6-7. No fracture is  noted. No prevertebral soft tissue swelling is noted.    Currently in Pain? Yes    Pain Score 8     Pain Location Neck    Pain Orientation Right;Left    Pain Descriptors / Indicators Aching    Pain Onset More than a month ago                               [facility] Adult PT Treatment/Exercise - 07/30/21 0001       Modalities   Modalities Electrical Stimulation;Moist Heat;Ultrasound      Moist Heat Therapy   Number Minutes Moist Heat 20 Minutes    Moist Heat Location Cervical      Electrical Stimulation   Electrical Stimulation Location Bil cervical  Electrical Stimulation Action Low-level IFC at 80-150 Hz.    Electrical Stimulation Parameters 40% scan x 20 minutes.    Electrical Stimulation Goals Pain;Tone      Ultrasound   Ultrasound Location Combo e'stim/US at 1.50 W/CM2 x 12 minutes to bilateral cervical muscualture.      Manual Therapy   Manual Therapy Soft tissue mobilization    Soft tissue mobilization STW/M x 12 minutes to patient's bilateral cervical muscualture and UT's.                          PT Long Term Goals - 07/28/21 1551       PT LONG TERM GOAL #1   Title Independent with a HEP.    Time 6    Period Weeks    Status New      PT LONG TERM GOAL #2   Title Increase active cervical rotation to 60 degrees+ so patient can turn head more easily while driving.    Time 6    Period Weeks    Status New      PT LONG TERM GOAL #3    Title Eliminate headaches.    Time 6    Period Weeks    Status New      PT LONG TERM GOAL #4   Title Perform ADL's with pain not > 3/10.    Time 6    Period Weeks    Status New                   Plan - 07/30/21 1036     Clinical Impression Statement The patient with continued increased tone over her bilateral cervical musculature.  She responded well to treatment today and felt better afterwards.    Personal Factors and Comorbidities Comorbidity 1;Comorbidity 2;Other    Comorbidities Blateral total knee replacments, left elbow fracture, recent ED visit for dizziness(see 07/25/21 visit), OA, DM, HTN, coronary artery stent placement, OP.    Examination-Activity Limitations Other;Sit    Examination-Participation Restrictions Cleaning;Other    Stability/Clinical Decision Making Stable/Uncomplicated    Rehab Potential Good    PT Frequency 2x / week    PT Duration 6 weeks    PT Treatment/Interventions ADLs/Self Care Home Management;Cryotherapy;Electrical Stimulation;Ultrasound;Moist Heat;Therapeutic activities;Therapeutic exercise;Manual techniques;Patient/family education;Passive range of motion;Dry needling    PT Next Visit Plan Postural exercises, combo e'stim/US, STW/M.    Consulted and Agree with Plan of Care Patient             Patient will benefit from skilled therapeutic intervention in order to improve the following deficits and impairments:  Decreased activity tolerance, Decreased range of motion, Increased muscle spasms, Postural dysfunction, Pain  Visit Diagnosis: Cervicalgia  Abnormal posture     Problem List Patient Active Problem List   Diagnosis Date Noted   Angina, class III ([place])    DOE (dyspnea on exertion)    CAD S/P percutaneous coronary angioplasty    Hyperlipidemia associated with type 2 diabetes mellitus ([place]) 11/29/2019   Hypertension associated with diabetes ([place]) 11/29/2019   DM type 2 causing vascular disease ([place]) 05/20/2019    Essential hypertension, benign 05/20/2019   Mixed hyperlipidemia 05/20/2019   Coronary artery disease involving native coronary artery of native heart with angina pectoris ([place]) 01/29/2019   Stented coronary artery 02/15/2018   Dizziness 11/20/2017   Stable angina ([place]) 11/19/2017   Bipolar affective disorder ([place]) 08/31/2016   OSA on CPAP 08/31/2016   Morbid obesity  due to excess calories ([place]) 01/07/2016   Left sided colitis with rectal bleeding ([place]) 07/15/2015   Environmental allergies 06/02/2014   Moderate persistent asthma without complication 27/82/9603   Depressed 05/01/2013   GERD (gastroesophageal reflux disease) 04/15/2011   History of total knee replacement 04/15/2011    , [place], PT 07/30/2021, 10:40 AM  [facility] [place], [place], [address] Phone: [phone]   Fax:  [phone]  Name: Czarina Gingras MRN: [id number] Date of Birth: [birth year]

## 2021-08-01 ENCOUNTER — Other Ambulatory Visit: Payer: Self-pay | Admitting: Family Medicine

## 2021-08-01 DIAGNOSIS — G44209 Tension-type headache, unspecified, not intractable: Secondary | ICD-10-CM

## 2021-08-01 DIAGNOSIS — M542 Cervicalgia: Secondary | ICD-10-CM

## 2021-08-03 ENCOUNTER — Ambulatory Visit: Payer: Medicare HMO

## 2021-08-03 DIAGNOSIS — M542 Cervicalgia: Secondary | ICD-10-CM | POA: Diagnosis not present

## 2021-08-03 DIAGNOSIS — R293 Abnormal posture: Secondary | ICD-10-CM | POA: Diagnosis not present

## 2021-08-03 DIAGNOSIS — G44209 Tension-type headache, unspecified, not intractable: Secondary | ICD-10-CM | POA: Diagnosis not present

## 2021-08-03 NOTE — Therapy (Signed)
[facility] [place], [place], [address] Phone: [phone]   Fax:  [phone]  Physical Therapy Treatment  Patient Details  Name: Belva Koziel MRN: [id number] Date of Birth: 1950/12/07 Referring Provider (PT): Ronnie Doss DO.   Encounter Date: 08/03/2021   PT End of Session - 08/03/21 1347     Visit Number 3    Number of Visits 12    Date for PT Re-Evaluation 09/08/21    Authorization Type FOTO AT LEAST EVERY 5TH VISIT.  PROGRESS NOTE AT 10TH VISIT.  KX MODIFIER AFTER 15 VISITS.    PT Start Time 6222    PT Stop Time 1423    PT Time Calculation (min) 38 min    Activity Tolerance Patient tolerated treatment well    Behavior During Therapy WFL for tasks assessed/performed             Past Medical History:  Diagnosis Date   Allergy    Anxiety    Arthritis    Asthma    Depression    Diabetes mellitus without complication ([place])    GERD (gastroesophageal reflux disease)    Hyperlipidemia    Hypertension     Past Surgical History:  Procedure Laterality Date   ABDOMINAL HYSTERECTOMY     BREAST EXCISIONAL BIOPSY     CESAREAN SECTION     3   CHOLECYSTECTOMY  2018   CORONARY STENT PLACEMENT  10/2017   [facility] hospital)   LEFT HEART CATH AND CORONARY ANGIOGRAPHY N/A 02/25/2019   Procedure: LEFT HEART CATH AND CORONARY ANGIOGRAPHY;  Surgeon: Nelva Bush, MD;  Location: [place] [facility];  Service: Cardiovascular;  Laterality: N/A;   REPLACEMENT TOTAL KNEE BILATERAL Bilateral    done in [place] (2003/2005)   RIGHT/LEFT HEART CATH AND CORONARY ANGIOGRAPHY N/A 09/22/2020   Procedure: RIGHT/LEFT HEART CATH AND CORONARY ANGIOGRAPHY;  Surgeon: Leonie Man, MD;  Location: [place] [facility];  Service: Cardiovascular;  Laterality: N/A;    There were no vitals filed for this visit.   Subjective Assessment - 08/03/21 1346     Subjective Patient reports that her neck is hurting like  crazy today. She notes that she felt good for a little after her last appointment, but it was worse the next day. She feels that her pain is worse today than it normally is.    Pertinent History Blateral total knee replacments, left elbow fracture, recent ED visit for dizziness(see 07/25/21 visit), OA, DM, HTN, coronary artery stent placement, OP.    How long can you sit comfortably? Looking down increases pain.    Diagnostic tests X-ray:  Minimal grade 1 anterolisthesis of C3-4 is noted secondary to  posterior facet joint hypertrophy. Mild grade 1 anterolisthesis of  C5-6 is noted secondary to posterior facet joint hypertrophy.  Moderate degenerative disc disease is noted at C6-7. No fracture is  noted. No prevertebral soft tissue swelling is noted.    Currently in Pain? Yes    Pain Score 5     Pain Location Neck    Pain Onset More than a month ago                               [facility] Adult PT Treatment/Exercise - 08/03/21 0001       Exercises   Exercises Neck      Neck Exercises: Seated   Shoulder Shrugs 15 reps  Other Seated Exercise Scapular retraction   15 reps     Neck Exercises: Supine   Neck Retraction 10 reps   limited due to pain     Modalities   Modalities Electrical Stimulation   no redness or adverse reaction to today's modalities     Electrical Stimulation   Electrical Stimulation Location bilateral upper trapezius    Electrical Stimulation Action IFC    Electrical Stimulation Parameters 80-150 Hz w/ 40% scan x 15 minutes    Electrical Stimulation Goals Pain;Tone      Manual Therapy   Manual Therapy Soft tissue mobilization    Manual therapy comments Grade I cervical CPA's were attempted, but were unable to be performed due to high pain irritability    Soft tissue mobilization to bilateral upper trapezius and cervical paraspinals                          PT Long Term Goals - 07/28/21 1551       PT LONG TERM GOAL #1   Title  Independent with a HEP.    Time 6    Period Weeks    Status New      PT LONG TERM GOAL #2   Title Increase active cervical rotation to 60 degrees+ so patient can turn head more easily while driving.    Time 6    Period Weeks    Status New      PT LONG TERM GOAL #3   Title Eliminate headaches.    Time 6    Period Weeks    Status New      PT LONG TERM GOAL #4   Title Perform ADL's with pain not > 3/10.    Time 6    Period Weeks    Status New                   Plan - 08/03/21 1420     Clinical Impression Statement Patient presented to treatment with high pain irritability which limited her ability to be introduced to new interventions. She was attempted to be introduced to chin tucks and scapular retractions, but this slightly increased her familair cervical pain. Manual therapy was attempted, but this was unable to provide a significant change to her familair symptoms. Electrical stimulation to the upper trapezius bilaterally was the most effective at reducing her familiar symptoms as she reported that she no longer had a headache upon the conclusion of treatment. She continues to require skilled physical therapy to address her remaining impairments to maximize her functional mobility.    Personal Factors and Comorbidities Comorbidity 1;Comorbidity 2;Other    Comorbidities Blateral total knee replacments, left elbow fracture, recent ED visit for dizziness(see 07/25/21 visit), OA, DM, HTN, coronary artery stent placement, OP.    Examination-Activity Limitations Other;Sit    Examination-Participation Restrictions Cleaning;Other    Stability/Clinical Decision Making Stable/Uncomplicated    Rehab Potential Good    PT Frequency 2x / week    PT Duration 6 weeks    PT Treatment/Interventions ADLs/Self Care Home Management;Cryotherapy;Electrical Stimulation;Ultrasound;Moist Heat;Therapeutic activities;Therapeutic exercise;Manual techniques;Patient/family education;Passive range of  motion;Dry needling    PT Next Visit Plan Postural exercises, combo e'stim/US, STW/M.    Consulted and Agree with Plan of Care Patient             Patient will benefit from skilled therapeutic intervention in order to improve the following deficits and impairments:  Decreased activity tolerance, Decreased range  of motion, Increased muscle spasms, Postural dysfunction, Pain  Visit Diagnosis: Cervicalgia  Abnormal posture     Problem List Patient Active Problem List   Diagnosis Date Noted   Angina, class III ([place])    DOE (dyspnea on exertion)    CAD S/P percutaneous coronary angioplasty    Hyperlipidemia associated with type 2 diabetes mellitus ([place]) 11/29/2019   Hypertension associated with diabetes ([place]) 11/29/2019   DM type 2 causing vascular disease ([place]) 05/20/2019   Essential hypertension, benign 05/20/2019   Mixed hyperlipidemia 05/20/2019   Coronary artery disease involving native coronary artery of native heart with angina pectoris ([place]) 01/29/2019   Stented coronary artery 02/15/2018   Dizziness 11/20/2017   Stable angina ([place]) 11/19/2017   Bipolar affective disorder ([place]) 08/31/2016   OSA on CPAP 08/31/2016   Morbid obesity due to excess calories ([place]) 01/07/2016   Left sided colitis with rectal bleeding ([place]) 07/15/2015   Environmental allergies 06/02/2014   Moderate persistent asthma without complication 70/17/7939   Depressed 05/01/2013   GERD (gastroesophageal reflux disease) 04/15/2011   History of total knee replacement 04/15/2011   Rationale for Evaluation and Treatment Rehabilitation   Darlin Coco, PT 08/03/2021, 3:22 PM  [facility] [address] [place], [place], [address] Phone: [phone]   Fax:  [phone]  Name: Danyka Merlin MRN: [id number] Date of Birth: [birth year]

## 2021-08-04 DIAGNOSIS — H9311 Tinnitus, right ear: Secondary | ICD-10-CM | POA: Insufficient documentation

## 2021-08-04 DIAGNOSIS — H9041 Sensorineural hearing loss, unilateral, right ear, with unrestricted hearing on the contralateral side: Secondary | ICD-10-CM | POA: Diagnosis not present

## 2021-08-04 DIAGNOSIS — H9121 Sudden idiopathic hearing loss, right ear: Secondary | ICD-10-CM | POA: Diagnosis not present

## 2021-08-05 ENCOUNTER — Encounter: Payer: Self-pay | Admitting: Pulmonary Disease

## 2021-08-05 ENCOUNTER — Ambulatory Visit: Payer: Medicare HMO | Admitting: Pulmonary Disease

## 2021-08-05 VITALS — BP 128/68 | HR 64 | Temp 97.8°F | Ht 63.0 in | Wt 242.2 lb

## 2021-08-05 DIAGNOSIS — G4733 Obstructive sleep apnea (adult) (pediatric): Secondary | ICD-10-CM | POA: Diagnosis not present

## 2021-08-05 DIAGNOSIS — J4550 Severe persistent asthma, uncomplicated: Secondary | ICD-10-CM | POA: Diagnosis not present

## 2021-08-05 DIAGNOSIS — R0609 Other forms of dyspnea: Secondary | ICD-10-CM | POA: Diagnosis not present

## 2021-08-05 MED ORDER — ARFORMOTEROL TARTRATE 15 MCG/2ML IN NEBU: 15.0000 ug | INHALATION_SOLUTION | Freq: Two times a day (BID) | RESPIRATORY_TRACT | 5 refills | Status: DC

## 2021-08-05 MED ORDER — BUDESONIDE 0.25 MG/2ML IN SUSP: 0.2500 mg | Freq: Two times a day (BID) | RESPIRATORY_TRACT | 5 refills | Status: DC

## 2021-08-05 NOTE — Progress Notes (Signed)
[facility]  Chief Complaint  Patient presents with   Follow-up    Breathing about the same since last ov  PFT 07/29/2021    Past Surgical History:  She  has a past surgical history that includes Cholecystectomy (2018); Abdominal hysterectomy; Cesarean section; Replacement total knee bilateral (Bilateral); Coronary stent placement (10/2017); LEFT HEART CATH AND CORONARY ANGIOGRAPHY (N/A, 02/25/2019); Breast excisional biopsy; and RIGHT/LEFT HEART CATH AND CORONARY ANGIOGRAPHY (N/A, 09/22/2020).  Past Medical History:  Allergies, Anxiety, Arthritis, Asthma, Depression, DM type 2, GERD, HLD, HTN, Lt leg DVT after knee surgery, CAD, Chronic diastolic CHF  Constitutional:  BP 128/68 (BP Location: Left Arm, Patient Position: Sitting)   Pulse 64   Temp 97.8 F (36.6 C) (Temporal)   Ht 5' 3"  (1.6 m)   Wt 242 lb 3.2 oz (109.9 kg)   SpO2 97% Comment: ra  BMI 42.90 kg/m   Brief Summary:  Amber Reeves is a 71 y.o. female with snoring and dyspnea.      Subjective:   Chest xray from 06/14/21 showed cardiomegaly and mild basilar atelectasis.  Labs from 06/14/21 showed eosinophil 300, RAST mildly positive for cedar and dust mites, and IgE elevated.  PFT from 07/29/21 showed mild restriction and diffusion defect, likely related to obesity.  She has been getting more headache/neck pain and ear pain.  She was referred to physical therapy and was seen by Dr. Redmond Baseman with ENT.    She gets winded with minimal activity.  It is hard for her to go outside and exercise because of the weather.  She ends up sleeping a fair amount during the day.  Her breathing gets better when she rests.  She has a hard time using breztri.  She feels using a nebulizer works better.  She doesn't think she could use CPAP again.  When she tried it before she struggled with mask fit, and kept getting episodes of sinus infections and bronchitis.   Physical Exam:   Appearance  - well kempt   ENMT - no sinus tenderness, no oral exudate, no LAN, Mallampati 4 airway, no stridor, wears dentures  Respiratory - equal breath sounds bilaterally, no wheezing or rales  CV - s1s2 regular rate and rhythm, no murmurs  Ext - no clubbing, no edema  Skin - no rashes  Psych - normal mood and affect    Pulmonary testing:  RAST 06/14/21 >> Cedar, Dust mites; IgE 548 PFT 07/29/21 >> FEV1 1.83 (83%), FEV1% 86, TLC 4.07 (83%), DLCO 76%  Chest Imaging:  CT angio chest 07/16/15 >> Lt lung ATX  Sleep Tests:  PSG 06/03/16 >> AHI 13  Cardiac Tests:  Echo 07/07/20 >> EF 65 to 70%, mild LVH, grade 2 DD, RVSP 35.9 mmHg RHC/LHC 09/22/20 >> elevated LVEDP and PCWP, CI 4.08, PVR 0.6 to 1.22 wood units, stable CAD  Social History:  She  reports that she has never smoked. She has never used smokeless tobacco. She reports that she does not drink alcohol and does not use drugs.  Family History:  Her family history includes Cancer in her father; Heart disease in her mother; Hypertension in her mother; Stroke in her father and mother.     Assessment/Plan:   Snoring with excessive daytime sleepiness. - she struggled with CPAP therapy before and would not want to try CPAP again - discussed that technology has progressed and there are different options for mask fits and CPAP settings - she will let us  know if she would like to get a sleep study set up, but would like to defer this for now  Allergic asthma. - she has difficulty using inhalers due to manual dexterity - will have her switch to budesonide and arformoterol by nebulized bid - continue singulair 10 mg qhs - continue prn duoneb and albuterol  Allergic rhinitis. - continue flonase, zyrtec, singulair  Obesity with deconditioning. - explained how this is contributing to her symptoms of dyspnea - reviewed options to develop a gradual exercise regimen  Coronary artery disease, Chronic diastolic CHF. - followed by Dr.  Kerry Hough with cardiology  Headache, neck pain. 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 - she is to follow up with Dr. Melida Quitter with ENT  Time Spent Involved in Patient Care on Day of Examination:  52 minutes  Follow up:   Patient Instructions  Stop using breztri  Pulmicort and Brovana nebulized in the morning and in the evening, and rinse your mouth after each use  Duoneb nebulized every 6 hours as needed for cough, wheeze, shortness of breath or chest congestion  Albuterol inhaler 2 puffs every 6 hours as needed for cough, wheeze, shortness of breath or chest congestion  Singulair 10 mg pill nightly  Zyrtec and flonase as needed for allergies and sinus congestion  Follow up in 4 months  Medication List:   Allergies as of 08/05/2021       Reactions   Aspartame Rash   Penicillins Itching, Rash   Did it involve swelling of the face/tongue/throat, SOB, or low BP? No Did it involve sudden or severe rash/hives, skin peeling, or any reaction on the inside of your mouth or nose? No Did you need to seek medical attention at a hospital or doctor's office? No When did it last happen?~25 years ago       If all above answers are "NO", may proceed with cephalosporin use.   Wilder Glade [dapagliflozin]    UTIs   Metformin And Related    GI distress, dizziness   No Healthtouch Food Allergies    Honey-Rash Artificial Sweeteners-rash   Paxil [paroxetine Hcl] Other (See Comments)   Twitching/feels like skin crawling   Prozac [fluoxetine Hcl] Other (See Comments)   Twitching/feels like skin crawling   Zoloft [sertraline Hcl] Other (See Comments)   Twitching/feels like skin crawling   Prednisone Palpitations   Ranexa [ranolazine] Palpitations   Heart racing        Medication List        Accurate as of August 05, 2021  1:05 PM. If you have any  questions, ask your nurse or doctor.          STOP taking these medications    Breztri Aerosphere 160-9-4.8 MCG/ACT Aero Generic drug: Budeson-Glycopyrrol-Formoterol Stopped by: Chesley Mires, MD       TAKE these medications    Accu-Chek Aviva Soln 2 Bottles by Other route as needed.   albuterol 108 (90 Base) MCG/ACT inhaler Commonly known as: VENTOLIN HFA TAKE 2 PUFFS BY MOUTH EVERY 4 HOURS AS NEEDED FOR WHEEZE   amLODipine 10 MG tablet Commonly known as: NORVASC Take 1 tablet (10 mg total) by mouth daily.   arformoterol 15 MCG/2ML Nebu Commonly known as: BROVANA Take 2 mLs (15 mcg total) by nebulization 2 (two) times daily. Started by: Chesley Mires, MD   aspirin EC 81  MG tablet Take 81 mg by mouth in the morning.   atorvastatin 40 MG tablet Commonly known as: LIPITOR Take 1 tablet (40 mg total) by mouth at bedtime.   B-D SINGLE USE SWABS REGULAR Pads Apply topically.   budesonide 0.25 MG/2ML nebulizer solution Commonly known as: Pulmicort Take 2 mLs (0.25 mg total) by nebulization 2 (two) times daily. Started by: Chesley Mires, MD   CALCIUM 600+D3 PO Take 1 tablet by mouth in the morning.   cetirizine 10 MG tablet Commonly known as: ZyrTEC Allergy Take 1 tablet (10 mg total) by mouth daily.   diphenhydrAMINE 25 MG tablet Commonly known as: BENADRYL Take 50 mg by mouth at bedtime.   fluticasone 50 MCG/ACT nasal spray Commonly known as: FLONASE Place 2 sprays into both nostrils daily.   furosemide 20 MG tablet Commonly known as: LASIX Take 1 tablet (20 mg total) by mouth as needed (based on weight - may take one extra tab for weight gain of 3lb in 24 hrs or 5lb in 1 week).   glucose blood test strip TEST BLOOD SUGAR TWICE DAILY   ibuprofen 200 MG tablet Commonly known as: ADVIL Take 400 mg by mouth daily as needed (pain (leg pain)).   Insulin Syringe-Needle U-100 31G X 5/16" 0.5 ML Misc Use to inject Insulin four times daily: Use as directed; Dx:  E11.9, E66.9   ipratropium-albuterol 0.5-2.5 (3) MG/3ML Soln Commonly known as: DUONEB TAKE 3 ML (1 VIAL) BY NEBULIZATION EVERY 6 HOURS AS NEEDED FOR WHEEZING. What changed: See the new instructions.   isosorbide mononitrate 60 MG 24 hr tablet Commonly known as: IMDUR Take 1 tablet (60 mg total) by mouth 2 (two) times daily.   lansoprazole 15 MG capsule Commonly known as: PREVACID Take 30 mg by mouth at bedtime.   lisinopril 40 MG tablet Commonly known as: ZESTRIL TAKE 1 TABLET EVERY DAY   magnesium oxide 400 MG tablet Commonly known as: MAG-OX Take magnesium on days you take lasix   methocarbamol 500 MG tablet Commonly known as: ROBAXIN TAKE 0.5-1 TABLETS (250-500 MG TOTAL) 2 (TWO) TIMES DAILY AS NEEDED FOR MUSCLE SPASMS (NECK PAIN).   metoprolol tartrate 25 MG tablet Commonly known as: LOPRESSOR Take 1 tablet (25 mg total) by mouth 2 (two) times daily.   montelukast 10 MG tablet Commonly known as: SINGULAIR Take 1 tablet (10 mg total) by mouth at bedtime.   nitroGLYCERIN 0.4 MG SL tablet Commonly known as: NITROSTAT Place 1 tablet (0.4 mg total) under the tongue every 5 (five) minutes as needed for chest pain.   pioglitazone 30 MG tablet Commonly known as: ACTOS TAKE 1 TABLET BY MOUTH EVERY DAY   potassium chloride 10 MEQ tablet Commonly known as: Klor-Con M10 TAKE 1 TABLET (10 MEQ TOTAL) BY MOUTH EVERY EVENING.   Tyler Aas FlexTouch 200 UNIT/ML FlexTouch Pen Generic drug: insulin degludec Inject 40 Units into the skin daily. What changed:  how much to take when to take this        Signature:  Chesley Mires, MD [place] Pager - [phone] 08/05/2021, 1:05 PM

## 2021-08-05 NOTE — Patient Instructions (Signed)
Stop using breztri  Pulmicort and Brovana nebulized in the morning and in the evening, and rinse your mouth after each use  Duoneb nebulized every 6 hours as needed for cough, wheeze, shortness of breath or chest congestion  Albuterol inhaler 2 puffs every 6 hours as needed for cough, wheeze, shortness of breath or chest congestion  Singulair 10 mg pill nightly  Zyrtec and flonase as needed for allergies and sinus congestion  Follow up in 4 months

## 2021-08-06 ENCOUNTER — Encounter: Payer: Medicare HMO | Admitting: *Deleted

## 2021-08-12 ENCOUNTER — Other Ambulatory Visit: Payer: Self-pay | Admitting: Family Medicine

## 2021-08-13 ENCOUNTER — Telehealth: Payer: Self-pay | Admitting: Family Medicine

## 2021-08-16 DIAGNOSIS — H9121 Sudden idiopathic hearing loss, right ear: Secondary | ICD-10-CM | POA: Diagnosis not present

## 2021-08-17 DIAGNOSIS — H9121 Sudden idiopathic hearing loss, right ear: Secondary | ICD-10-CM | POA: Diagnosis not present

## 2021-08-19 NOTE — Telephone Encounter (Signed)
Please schedule appt with me Telephone is fine! Patient wants to discuss meds and I will need to cancel a patient assistance program

## 2021-08-20 ENCOUNTER — Telehealth: Payer: Self-pay

## 2021-08-20 NOTE — Chronic Care Management (AMB) (Unsigned)
  Chronic Care Management Note  08/20/2021 Name: [name] MRN: [id number] DOB: 09-05-1950  Amber Reeves is a 71 y.o. year old female who is a primary care patient of Janora Norlander, DO and is actively engaged with the care management team. I reached out to Celesta Aver by phone today to assist with re-scheduling a follow up visit with the Pharmacist  Follow up plan: Unsuccessful telephone outreach attempt made. A HIPAA compliant phone message was left for the patient providing contact information and requesting a return call.  The care management team will reach out to the patient again over the next 7 days.  If patient returns call to provider office, please advise to call [facility]  at [place], [place], [place], [place] [address] Direct Dial: [phone] [email or web address] Website: [email or web address]

## 2021-08-23 NOTE — Chronic Care Management (AMB) (Signed)
  Chronic Care Management Note  08/23/2021 Name: Kinnley Paulson MRN: [id number] DOB: [birth year]  Laverna Dossett is a 71 y.o. year old female who is a primary care patient of Janora Norlander, DO and is actively engaged with the care management team. I reached out to Celesta Aver by phone today to assist with re-scheduling an initial visit with the Pharmacist  Follow up plan: Telephone appointment with care management team member scheduled for:09/16/2021  [address], [place], [place], [place] [address] Direct Dial: [phone] [email or web address]

## 2021-08-25 ENCOUNTER — Ambulatory Visit: Payer: Medicare HMO

## 2021-08-25 DIAGNOSIS — H9121 Sudden idiopathic hearing loss, right ear: Secondary | ICD-10-CM | POA: Diagnosis not present

## 2021-08-27 DIAGNOSIS — H9121 Sudden idiopathic hearing loss, right ear: Secondary | ICD-10-CM | POA: Diagnosis not present

## 2021-08-30 ENCOUNTER — Ambulatory Visit (INDEPENDENT_AMBULATORY_CARE_PROVIDER_SITE_OTHER): Payer: Medicare HMO | Admitting: Family Medicine

## 2021-08-30 ENCOUNTER — Encounter: Payer: Self-pay | Admitting: Family Medicine

## 2021-08-30 ENCOUNTER — Inpatient Hospital Stay (INDEPENDENT_AMBULATORY_CARE_PROVIDER_SITE_OTHER): Payer: Medicare HMO

## 2021-08-30 VITALS — BP 141/88 | HR 61 | Temp 97.6°F | Ht 63.0 in | Wt 241.4 lb

## 2021-08-30 DIAGNOSIS — J4540 Moderate persistent asthma, uncomplicated: Secondary | ICD-10-CM | POA: Diagnosis not present

## 2021-08-30 DIAGNOSIS — Z794 Long term (current) use of insulin: Secondary | ICD-10-CM | POA: Diagnosis not present

## 2021-08-30 DIAGNOSIS — E1159 Type 2 diabetes mellitus with other circulatory complications: Secondary | ICD-10-CM | POA: Diagnosis not present

## 2021-08-30 DIAGNOSIS — E785 Hyperlipidemia, unspecified: Secondary | ICD-10-CM | POA: Diagnosis not present

## 2021-08-30 DIAGNOSIS — R42 Dizziness and giddiness: Secondary | ICD-10-CM | POA: Diagnosis not present

## 2021-08-30 DIAGNOSIS — I499 Cardiac arrhythmia, unspecified: Secondary | ICD-10-CM

## 2021-08-30 DIAGNOSIS — E1169 Type 2 diabetes mellitus with other specified complication: Secondary | ICD-10-CM | POA: Diagnosis not present

## 2021-08-30 DIAGNOSIS — I152 Hypertension secondary to endocrine disorders: Secondary | ICD-10-CM

## 2021-08-30 LAB — BAYER DCA HB A1C WAIVED: HB A1C (BAYER DCA - WAIVED): 6.1 % — ABNORMAL HIGH (ref 4.8–5.6)

## 2021-08-30 MED ORDER — BREZTRI AEROSPHERE 160-9-4.8 MCG/ACT IN AERO: 2.0000 | INHALATION_SPRAY | Freq: Two times a day (BID) | RESPIRATORY_TRACT | 0 refills | Status: DC

## 2021-08-30 NOTE — Patient Instructions (Signed)
Try the Breztri 2 puffs twice daily INSTEAD of the Symbicort and the Brovana (aformetarol nebulizers)  If this works well, Amber Reeves may be able to get this for you instead of the Symbicort  Let me know when you are ready for a referral to Dr Nelva Bush for neck assessment and possible injection therapy to help neck pain/ headaches  I will message Dr Harl Bowie about the patch we put on today.  Hopefully, we'll get some readouts quickly after you mail back

## 2021-08-30 NOTE — Progress Notes (Signed)
Subjective: CC:Dm PCP: Janora Norlander, DO IRW:[id number] Amber Reeves is a 71 y.o. female presenting to clinic today for:  1. Type 2 Diabetes with hypertension, hyperlipidemia:  She is compliant with all meds.  Continues to have intermittent dizziness.  She is under the care of ENT that is further evaluating this.  She has had 4 shots in her ear and will be having repeat hearing testing on 09/09/2021.  There was discussion of possible tumor formation and they may need to consider MRI to further evaluate but this is pending her repeat hearing test.  She has had some intermittent response to these injections but no resolution in symptoms.  Additionally, she was told by the EMT that she had new onset atrial fibrillation.  This was not identified on EKG by the ER physician.  She does not report any racing heart but does report intermittent dizziness as aforementioned  Last eye exam: Needs Last foot exam: Up-to-date Last A1c:  Lab Results  Component Value Date   HGBA1C 6.1 (H) 08/30/2021   Nephropathy screen indicated?:  Up-to-date Last flu, zoster and/or pneumovax:  Immunization History  Administered Date(s) Administered   Influenza, High Dose Seasonal PF 02/16/2017   Influenza,inj,Quad PF,6+ Mos 12/18/2013   Moderna Sars-Covid-2 Vaccination 05/01/2019, 05/29/2019, 02/18/2020    ROS: No chest pain reported.  ROS: Per HPI  Allergies  Allergen Reactions   Aspartame Rash   Penicillins Itching and Rash    Did it involve swelling of the face/tongue/throat, SOB, or low BP? No Did it involve sudden or severe rash/hives, skin peeling, or any reaction on the inside of your mouth or nose? No Did you need to seek medical attention at a hospital or doctor's office? No When did it last happen?~25 years ago       If all above answers are "NO", may proceed with cephalosporin use.    Farxiga [Dapagliflozin]     UTIs   Metformin And Related     GI distress, dizziness   No Healthtouch Food  Allergies     Honey-Rash Artificial Sweeteners-rash   Paxil [Paroxetine Hcl] Other (See Comments)    Twitching/feels like skin crawling   Prozac [Fluoxetine Hcl] Other (See Comments)    Twitching/feels like skin crawling   Zoloft [Sertraline Hcl] Other (See Comments)    Twitching/feels like skin crawling   Prednisone Palpitations   Ranexa [Ranolazine] Palpitations    Heart racing   Past Medical History:  Diagnosis Date   Allergy    Anxiety    Arthritis    Asthma    Depression    Diabetes mellitus without complication (HCC)    GERD (gastroesophageal reflux disease)    Hyperlipidemia    Hypertension     Current Outpatient Medications:    albuterol (VENTOLIN HFA) 108 (90 Base) MCG/ACT inhaler, TAKE 2 PUFFS BY MOUTH EVERY 4 HOURS AS NEEDED FOR WHEEZE, Disp: 18 g, Rfl: 3   Alcohol Swabs (B-D SINGLE USE SWABS REGULAR) PADS, Apply topically., Disp: , Rfl:    arformoterol (BROVANA) 15 MCG/2ML NEBU, Take 2 mLs (15 mcg total) by nebulization 2 (two) times daily., Disp: 120 mL, Rfl: 5   aspirin EC 81 MG tablet, Take 81 mg by mouth in the morning., Disp: , Rfl:    atorvastatin (LIPITOR) 40 MG tablet, Take 1 tablet (40 mg total) by mouth at bedtime., Disp: 90 tablet, Rfl: 3   Blood Glucose Calibration (ACCU-CHEK AVIVA) SOLN, 2 Bottles by Other route as needed., Disp: , Rfl:  Budeson-Glycopyrrol-Formoterol (BREZTRI AEROSPHERE) 160-9-4.8 MCG/ACT AERO, Inhale 2 puffs into the lungs 2 (two) times daily., Disp: 5.9 g, Rfl: 0   budesonide (PULMICORT) 0.25 MG/2ML nebulizer solution, Take 2 mLs (0.25 mg total) by nebulization 2 (two) times daily., Disp: 120 mL, Rfl: 5   Calcium Carb-Cholecalciferol (CALCIUM 600+D3 PO), Take 1 tablet by mouth in the morning., Disp: , Rfl:    cetirizine (ZYRTEC ALLERGY) 10 MG tablet, Take 1 tablet (10 mg total) by mouth daily., Disp: 90 tablet, Rfl: 1   diphenhydrAMINE (BENADRYL) 25 MG tablet, Take 50 mg by mouth at bedtime., Disp: , Rfl:    fluticasone (FLONASE) 50  MCG/ACT nasal spray, Place 2 sprays into both nostrils daily., Disp: 16 g, Rfl: 6   furosemide (LASIX) 20 MG tablet, Take 1 tablet (20 mg total) by mouth as needed (based on weight - may take one extra tab for weight gain of 3lb in 24 hrs or 5lb in 1 week)., Disp: , Rfl:    glucose blood test strip, TEST BLOOD SUGAR TWICE DAILY, Disp: , Rfl:    ibuprofen (ADVIL) 200 MG tablet, Take 400 mg by mouth daily as needed (pain (leg pain))., Disp: , Rfl:    insulin degludec (TRESIBA FLEXTOUCH) 200 UNIT/ML FlexTouch Pen, Inject 40 Units into the skin daily. (Patient taking differently: Inject 38 Units into the skin in the morning.), Disp: 18 mL, Rfl: 0   Insulin Syringe-Needle U-100 31G X 5/16" 0.5 ML MISC, Use to inject Insulin four times daily: Use as directed; Dx: E11.9, E66.9, Disp: , Rfl:    ipratropium-albuterol (DUONEB) 0.5-2.5 (3) MG/3ML SOLN, TAKE 3 ML (1 VIAL) BY NEBULIZATION EVERY 6 HOURS AS NEEDED FOR WHEEZING. (Patient taking differently: Take 3 mLs by nebulization every 6 (six) hours as needed (wheezing).), Disp: 360 mL, Rfl: 0   isosorbide mononitrate (IMDUR) 60 MG 24 hr tablet, Take 1 tablet (60 mg total) by mouth 2 (two) times daily., Disp: 180 tablet, Rfl: 2   lansoprazole (PREVACID) 15 MG capsule, Take 30 mg by mouth at bedtime., Disp: , Rfl:    lisinopril (ZESTRIL) 40 MG tablet, TAKE 1 TABLET EVERY DAY, Disp: 90 tablet, Rfl: 0   magnesium oxide (MAG-OX) 400 MG tablet, Take magnesium on days you take lasix, Disp: 60 tablet, Rfl: 3   methocarbamol (ROBAXIN) 500 MG tablet, TAKE 0.5-1 TABLETS (250-500 MG TOTAL) 2 (TWO) TIMES DAILY AS NEEDED FOR MUSCLE SPASMS (NECK PAIN)., Disp: 30 tablet, Rfl: 1   metoprolol tartrate (LOPRESSOR) 25 MG tablet, Take 1 tablet (25 mg total) by mouth 2 (two) times daily., Disp: 180 tablet, Rfl: 1   montelukast (SINGULAIR) 10 MG tablet, Take 1 tablet (10 mg total) by mouth at bedtime., Disp: 90 tablet, Rfl: 3   nitroGLYCERIN (NITROSTAT) 0.4 MG SL tablet, Place 1 tablet  (0.4 mg total) under the tongue every 5 (five) minutes as needed for chest pain., Disp: 25 tablet, Rfl: 3   pioglitazone (ACTOS) 30 MG tablet, TAKE 1 TABLET BY MOUTH EVERY DAY, Disp: 90 tablet, Rfl: 0   potassium chloride (KLOR-CON M10) 10 MEQ tablet, TAKE 1 TABLET (10 MEQ TOTAL) BY MOUTH EVERY EVENING., Disp: 90 tablet, Rfl: 0   amLODipine (NORVASC) 10 MG tablet, Take 1 tablet (10 mg total) by mouth daily., Disp: 90 tablet, Rfl: 3  Current Facility-Administered Medications:    sodium chloride flush (NS) 0.9 % injection 3 mL, 3 mL, Intravenous, Q12H, Branch, Alphonse Guild, MD Social History   Socioeconomic History   Marital status: Married  Spouse name: Fritz Pickerel   Number of children: 3   Years of education: Not on file   Highest education level: Not on file  Occupational History   Occupation: retired  Tobacco Use   Smoking status: Never   Smokeless tobacco: Never  Vaping Use   Vaping Use: Never used  Substance and Sexual Activity   Alcohol use: Never   Drug use: Never   Sexual activity: Not Currently  Other Topics Concern   Not on file  Social History Narrative   Resides at home with her husband.  She is originally from [place] but relocated to [place] and then to [place] in August 2020.  She has a son who lives about 2 hours away in [place] and a sister-in-law who lives in [place].   2 children in [place] Determinants of Health   Financial Resource Strain: Low Risk  (07/20/2021)   Overall Financial Resource Strain (CARDIA)    Difficulty of Paying Living Expenses: Not hard at all  Food Insecurity: No Food Insecurity (07/20/2021)   Hunger Vital Sign    Worried About Running Out of Food in the Last Year: Never true    Ran Out of Food in the Last Year: Never true  Transportation Needs: No Transportation Needs (07/20/2021)   PRAPARE - Hydrologist (Medical): No    Lack of Transportation (Non-Medical): No  Physical Activity: Inactive  (07/20/2021)   Exercise Vital Sign    Days of Exercise per Week: 0 days    Minutes of Exercise per Session: 0 min  Stress: No Stress Concern Present (07/20/2021)   [place]    Feeling of Stress : Only a little  Social Connections: Moderately Isolated (07/20/2021)   Social Connection and Isolation Panel [NHANES]    Frequency of Communication with Friends and Family: More than three times a week    Frequency of Social Gatherings with Friends and Family: Once a week    Attends Religious Services: Never    Marine scientist or Organizations: No    Attends Archivist Meetings: Never    Marital Status: Married  Human resources officer Violence: Not At Risk (07/20/2021)   Humiliation, Afraid, Rape, and Kick questionnaire    Fear of Current or Ex-Partner: No    Emotionally Abused: No    Physically Abused: No    Sexually Abused: No   Family History  Problem Relation Age of Onset   Heart disease Mother    Stroke Mother    Hypertension Mother    Cancer Father    Stroke Father     Objective: Office vital signs reviewed. BP (!) 141/88   Pulse 61   Temp 97.6 F (36.4 C)   Ht 5' 3"  (1.6 m)   Wt 241 lb 6.4 oz (109.5 kg)   SpO2 99%   BMI 42.76 kg/m   Physical Examination:  General: Awake, alert, morbidly obese, No acute distress HEENT: Sclera white.  No nystagmus Cardio: regular rate and rhythm, S1S2 heard, no murmurs appreciated Pulm: clear to auscultation bilaterally, no wheezes, rhonchi or rales; normal work of breathing on room air MSK: Ambulating independently.  Does become somewhat dizzy with standing Neuro: No tremor  Assessment/ Plan: 71 y.o. female   Controlled type 2 diabetes mellitus with other specified complication, with long-term current use of insulin ([facility]) - Plan: Bayer DCA Hb A1c Waived  Hypertension associated with diabetes ([facility])  Hyperlipidemia associated with type 2 diabetes mellitus  (HCC)  Cardiac arrhythmia, unspecified cardiac arrhythmia type - Plan: TSH, T4, free, LONG TERM MONITOR (3-14 DAYS)  Dizziness  Moderate persistent asthma without complication - Plan: Budeson-Glycopyrrol-Formoterol (BREZTRI AEROSPHERE) 160-9-4.8 MCG/ACT AERO  Blood sugar under excellent control.  No changes  Blood pressure at goal for age.  Continue current regimen.  Continue statin.  Not yet due for fasting lipid  I reviewed her ER notes and looked at the EKGs, neither which have demonstrated any arrhythmia concerning for A-fib but her reports today is that EMT told her she had new onset atrial fibrillation.  For this reason I placed a Zio patch on to her.  I will CC her primary cardiologist, whom she has a visit with in July.  Hopefully will have some information by that visit.  Her cardiopulmonary exam is unremarkable today.  Suspect dizziness is due to some type of inner ear problem on the right and this is to be further evaluated by her ENT soon on 09/09/2021.  Sample of Breztri provided because the aformoterol was not affordable and her Symbicort will no longer be available for through the patient assistance program.  We will CC [name] as FYI.  Patient will be having an appoint with her soon so we will trial the Brextri to see if perhaps this might be of use to the patient   Orders Placed This Encounter  Procedures   Bayer DCA Hb A1c Waived   TSH   T4, free   LONG TERM MONITOR (3-14 DAYS)    Standing Status:   Future    Number of Occurrences:   1    Standing Expiration Date:   08/31/2022    Order Specific Question:   Where should this test be performed?    Answer:   Internal    Order Specific Question:   Does the patient have an implanted cardiac device?    Answer:   No    Order Specific Question:   Prescribed days of wear    Answer:   32    Order Specific Question:   Type of enrollment    Answer:   Home Enrollment    Order Specific Question:   Vendor:    Answer:   [employer]    Meds ordered this encounter  Medications   Budeson-Glycopyrrol-Formoterol (BREZTRI AEROSPHERE) 160-9-4.8 MCG/ACT AERO    Sig: Inhale 2 puffs into the lungs 2 (two) times daily.    Dispense:  5.9 g    Refill:  0      Windell Moulding, DO [place] [facility] [phone]

## 2021-08-31 LAB — TSH: TSH: 4.47 u[IU]/mL (ref 0.450–4.500)

## 2021-08-31 LAB — T4, FREE: Free T4: 1.35 ng/dL (ref 0.82–1.77)

## 2021-09-03 ENCOUNTER — Other Ambulatory Visit: Payer: Self-pay

## 2021-09-03 ENCOUNTER — Ambulatory Visit: Payer: Medicare HMO | Admitting: *Deleted

## 2021-09-03 DIAGNOSIS — I499 Cardiac arrhythmia, unspecified: Secondary | ICD-10-CM

## 2021-09-03 NOTE — Progress Notes (Signed)
Pt came in today have her Zio monitor checked out It was flashing this morning Her husband taped it down and it is no longer flashing Before her appt I had called [employer] customer service about this They instructed me that a slow flasing light was that it was not conncected well and to just massage / tape down the connectors If there was a rapid orange flash that the Zio was damaged & would have to be replaced, and for [place] to call & place a 'hold' claim to the patient would not be charged for the first one.  In talking with customer service they said they could not see pt's order for the serial number [id number] which was placed on 08/30/21 and to place another order and go back through the registration steps. This was tried but the order came up with today's date. Will pass this along to [place] to contact [employer] to get this corrected so we can get the patients result when she turns this in.

## 2021-09-03 NOTE — Addendum Note (Signed)
Addended by: Everlean Cherry on: 09/03/2021 02:33 PM   Modules accepted: Orders

## 2021-09-03 NOTE — Addendum Note (Signed)
Addended by: Antonietta Barcelona D on: 09/03/2021 02:51 PM   Modules accepted: Orders

## 2021-09-07 ENCOUNTER — Other Ambulatory Visit (HOSPITAL_COMMUNITY): Payer: Self-pay

## 2021-09-07 ENCOUNTER — Telehealth: Payer: Self-pay

## 2021-09-07 NOTE — Telephone Encounter (Signed)
Patient Advocate Encounter   Received notification that prior authorization is required for Arformoterol Garlon Hatchet)  Diagnoses used: J45.50 - Severe persistent asthma, uncomplicated W43.15 - Dyspnea on exertion  Key [id number] Submitted: 09/07/21 Status is pending  Clista Bernhardt, [place] Patient Advocate Specialist [place] Patient Advocate Team Phone: [phone]   Fax: [phone]

## 2021-09-08 ENCOUNTER — Telehealth: Payer: Self-pay | Admitting: Family Medicine

## 2021-09-08 NOTE — Telephone Encounter (Signed)
She may be having relative hypoglycemia (meaning she is used to HIGH sugar and a NORMAL sugar FEELS low).  These sugar levels are NORMAL however.  Ok to back down by 1 unit of insulin to see if this helps.  Continue monitoring sugar closely.  Call for anything less than 70 or greater than 350

## 2021-09-08 NOTE — Telephone Encounter (Signed)
Yesterday sugar level 77 Today sugar leve 88 Pt is dizzy and shakey  Pt wants to know if she it is too much insulin Pt is taking 37 units and taking a atose  Is sugar level too low?  Please call back

## 2021-09-08 NOTE — Telephone Encounter (Signed)
Pt aware to go down my 1 unit

## 2021-09-09 ENCOUNTER — Other Ambulatory Visit: Payer: Self-pay | Admitting: Otolaryngology

## 2021-09-09 DIAGNOSIS — H9041 Sensorineural hearing loss, unilateral, right ear, with unrestricted hearing on the contralateral side: Secondary | ICD-10-CM | POA: Diagnosis not present

## 2021-09-09 DIAGNOSIS — H9121 Sudden idiopathic hearing loss, right ear: Secondary | ICD-10-CM

## 2021-09-13 NOTE — Telephone Encounter (Signed)
Patient Advocate Encounter  Prior Authorization for Arformoterol Garlon Hatchet) has been approved under MEDICARE PART B coverage Effective through 02/20/2022  Clista Bernhardt, CPhT Pharmacy Patient Advocate Specialist [place] Patient Advocate Team Phone: [phone]   Fax: [phone]

## 2021-09-14 ENCOUNTER — Other Ambulatory Visit: Payer: Self-pay | Admitting: *Deleted

## 2021-09-14 ENCOUNTER — Telehealth: Payer: Self-pay

## 2021-09-14 ENCOUNTER — Inpatient Hospital Stay (INDEPENDENT_AMBULATORY_CARE_PROVIDER_SITE_OTHER): Payer: Medicare HMO

## 2021-09-14 DIAGNOSIS — I499 Cardiac arrhythmia, unspecified: Secondary | ICD-10-CM

## 2021-09-14 NOTE — Progress Notes (Signed)
This has been taken care of - see other tele encounter from 09/14/21.

## 2021-09-14 NOTE — Telephone Encounter (Signed)
[employer] called back to discuss this monitor.   The original (2 ) orders were placed as Home enrolled - this had to be changed   I placed new order as clinic enrolled and they were able to see it.   The machine is not back to [employer] yet, but once they receive it, we will get a report as we normally do.

## 2021-09-14 NOTE — Telephone Encounter (Signed)
Needs to be registered. Given in clinic.   Home enrollment order was placed. Needs to be in clinic order in epic  Serial  [id number]  Kennyth Lose [phone] with [employer]  Ref # [id number]  Registered in [place] no call back needed.

## 2021-09-15 ENCOUNTER — Ambulatory Visit: Payer: Medicare HMO | Admitting: Cardiology

## 2021-09-15 ENCOUNTER — Encounter: Payer: Self-pay | Admitting: Cardiology

## 2021-09-15 VITALS — BP 106/60 | HR 60 | Ht 63.0 in | Wt 240.8 lb

## 2021-09-15 DIAGNOSIS — I25119 Atherosclerotic heart disease of native coronary artery with unspecified angina pectoris: Secondary | ICD-10-CM | POA: Diagnosis not present

## 2021-09-15 DIAGNOSIS — I152 Hypertension secondary to endocrine disorders: Secondary | ICD-10-CM | POA: Diagnosis not present

## 2021-09-15 DIAGNOSIS — E1159 Type 2 diabetes mellitus with other circulatory complications: Secondary | ICD-10-CM

## 2021-09-15 DIAGNOSIS — I5032 Chronic diastolic (congestive) heart failure: Secondary | ICD-10-CM | POA: Diagnosis not present

## 2021-09-15 DIAGNOSIS — F319 Bipolar disorder, unspecified: Secondary | ICD-10-CM | POA: Diagnosis not present

## 2021-09-15 MED ORDER — NITROGLYCERIN 0.4 MG SL SUBL: 0.4000 mg | SUBLINGUAL_TABLET | SUBLINGUAL | 3 refills | Status: DC | PRN

## 2021-09-15 MED ORDER — ISOSORBIDE MONONITRATE ER 30 MG PO TB24: 30.0000 mg | ORAL_TABLET | Freq: Two times a day (BID) | ORAL | 0 refills | Status: DC

## 2021-09-15 MED ORDER — LISINOPRIL 20 MG PO TABS: 20.0000 mg | ORAL_TABLET | Freq: Every day | ORAL | 0 refills | Status: DC

## 2021-09-15 NOTE — Progress Notes (Signed)
Clinical Summary Amber Reeves is a 71 y.o.femaleseen today for follow up of the following medical problems.    1. CAD -11/2017 cath at [facility] showed >70% mid LAD, otherwise patent vessels. She presented with chest pain, not an MI - received DES to LAD - 11/2017 echo  LVEF Complete echocardiogram shows a left ventricular EF by visual approximation 60 to 65%. Normal left ventricular systolic function. Grade 1 left ventricular diastolic function. No significant valvular disease. No pericardial effusion   01/2018 stress test: THR not achieved, exercise limiting angina , duke treadmill score neg 1   - she reports prior false negative stress tests in the past.  - 09/04/18 nuclear stress by prior cardiollogist showed anterior breast artifact, probably normal,     Jan 2021 cath: mild nonobstructive disease.    -06/2020 echo LVEF 65-70%, grade II dd, - lasix changed to 12m daily   09/2020 RHC/LHC: LM patent, patent LAD stent, ramus patent, LCX patent, RCA 20%. Several tiny diagonals are jailed by stent, to small for intervetion Mean PA 21 PCWP 16 CI 4.08 LVEDP 18     -recent symptoms started Sunday. Could be sharp or nagging, midchest. At rest and with exertion. 2-3/10 in severity. Some associated SOB. Worst with deep breathing. Lasted 5-10 minutes, went ways on its own.  "Weird feeling" during that episode, felt like she may pass out. Felt lightheaded. No specific palpitations. Sat down and started to feel better.  - had some cough, slight wheeze. Nonprodcutive. No fevers or chills. Had been using nebulzier/inhaler leading up to episode some improvement. Overall increased use nebulizer   -on Monday recurrnt pain. Tootache like pain midchest, 2/10 in severity. Some improvement in symptoms after 20 minutes. Pain lasted few minutes, off and on throught the day. Somewhat different from prior chest pains.  Tuesday felt well overall.  - we increased norvasc to 121mdaily  - chest pains  less often than prior     2.SOB - compliant inhalers and nebs, can improve with nebs - some recent cough, wheezing.  - some increased LE edema, weight is up 14 lbs since Jan      2. HTN - renal artery angio at time of 11/2017 cath showed no significant renal artery disease       3. Hyperlipidemia - she is on a statin     4. OSA on cpap - she attributes getting pneumonia when on cpap, she stopped wearing. - does not want to wear cpap       5. Chronic diastolic HF - no recent edema   6. Dizziness - ER visit 07/2021  - from EMS notes bps 80s to 90s with standing.  - improved with IVFs  - dizziness can happen with laying flat, sitting or standing.  - recent inner ear issues, followed by ENT - bps were low 104/64 - takes lasix prn, has been taking daily recently.  - 1 liter of water daily, 3-4 can pepsi daily.  Past Medical History:  Diagnosis Date   Allergy    Anxiety    Arthritis    Asthma    Depression    Diabetes mellitus without complication (HCC)    GERD (gastroesophageal reflux disease)    Hyperlipidemia    Hypertension      Allergies  Allergen Reactions   Aspartame Rash   Penicillins Itching and Rash    Did it involve swelling of the face/tongue/throat, SOB, or low BP? No Did it involve sudden  or severe rash/hives, skin peeling, or any reaction on the inside of your mouth or nose? No Did you need to seek medical attention at a hospital or doctor's office? No When did it last happen?~25 years ago       If all above answers are "NO", may proceed with cephalosporin use.    Wilder Glade [Dapagliflozin]     UTIs   Metformin And Related     GI distress, dizziness   No Healthtouch Food Allergies     Honey-Rash Artificial Sweeteners-rash   Paxil [Paroxetine Hcl] Other (See Comments)    Twitching/feels like skin crawling   Prozac [Fluoxetine Hcl] Other (See Comments)    Twitching/feels like skin crawling   Zoloft [Sertraline Hcl] Other (See Comments)     Twitching/feels like skin crawling   Prednisone Palpitations   Ranexa [Ranolazine] Palpitations    Heart racing     Current Outpatient Medications  Medication Sig Dispense Refill   albuterol (VENTOLIN HFA) 108 (90 Base) MCG/ACT inhaler TAKE 2 PUFFS BY MOUTH EVERY 4 HOURS AS NEEDED FOR WHEEZE 18 g 3   Alcohol Swabs (B-D SINGLE USE SWABS REGULAR) PADS Apply topically.     amLODipine (NORVASC) 10 MG tablet Take 1 tablet (10 mg total) by mouth daily. 90 tablet 3   arformoterol (BROVANA) 15 MCG/2ML NEBU Take 2 mLs (15 mcg total) by nebulization 2 (two) times daily. 120 mL 5   aspirin EC 81 MG tablet Take 81 mg by mouth in the morning.     atorvastatin (LIPITOR) 40 MG tablet Take 1 tablet (40 mg total) by mouth at bedtime. 90 tablet 3   Blood Glucose Calibration (ACCU-CHEK AVIVA) SOLN 2 Bottles by Other route as needed.     Budeson-Glycopyrrol-Formoterol (BREZTRI AEROSPHERE) 160-9-4.8 MCG/ACT AERO Inhale 2 puffs into the lungs 2 (two) times daily. 5.9 g 0   budesonide (PULMICORT) 0.25 MG/2ML nebulizer solution Take 2 mLs (0.25 mg total) by nebulization 2 (two) times daily. 120 mL 5   Calcium Carb-Cholecalciferol (CALCIUM 600+D3 PO) Take 1 tablet by mouth in the morning.     cetirizine (ZYRTEC ALLERGY) 10 MG tablet Take 1 tablet (10 mg total) by mouth daily. 90 tablet 1   diphenhydrAMINE (BENADRYL) 25 MG tablet Take 50 mg by mouth at bedtime.     fluticasone (FLONASE) 50 MCG/ACT nasal spray Place 2 sprays into both nostrils daily. 16 g 6   furosemide (LASIX) 20 MG tablet Take 1 tablet (20 mg total) by mouth as needed (based on weight - may take one extra tab for weight gain of 3lb in 24 hrs or 5lb in 1 week).     glucose blood test strip TEST BLOOD SUGAR TWICE DAILY     ibuprofen (ADVIL) 200 MG tablet Take 400 mg by mouth daily as needed (pain (leg pain)).     insulin degludec (TRESIBA FLEXTOUCH) 200 UNIT/ML FlexTouch Pen Inject 40 Units into the skin daily. (Patient taking differently: Inject 38  Units into the skin in the morning.) 18 mL 0   Insulin Syringe-Needle U-100 31G X 5/16" 0.5 ML MISC Use to inject Insulin four times daily: Use as directed; Dx: E11.9, E66.9     ipratropium-albuterol (DUONEB) 0.5-2.5 (3) MG/3ML SOLN TAKE 3 ML (1 VIAL) BY NEBULIZATION EVERY 6 HOURS AS NEEDED FOR WHEEZING. (Patient taking differently: Take 3 mLs by nebulization every 6 (six) hours as needed (wheezing).) 360 mL 0   isosorbide mononitrate (IMDUR) 60 MG 24 hr tablet Take 1 tablet (60 mg total)  by mouth 2 (two) times daily. 180 tablet 2   lansoprazole (PREVACID) 15 MG capsule Take 30 mg by mouth at bedtime.     lisinopril (ZESTRIL) 40 MG tablet TAKE 1 TABLET EVERY DAY 90 tablet 0   magnesium oxide (MAG-OX) 400 MG tablet Take magnesium on days you take lasix 60 tablet 3   methocarbamol (ROBAXIN) 500 MG tablet TAKE 0.5-1 TABLETS (250-500 MG TOTAL) 2 (TWO) TIMES DAILY AS NEEDED FOR MUSCLE SPASMS (NECK PAIN). 30 tablet 1   metoprolol tartrate (LOPRESSOR) 25 MG tablet Take 1 tablet (25 mg total) by mouth 2 (two) times daily. 180 tablet 1   montelukast (SINGULAIR) 10 MG tablet Take 1 tablet (10 mg total) by mouth at bedtime. 90 tablet 3   nitroGLYCERIN (NITROSTAT) 0.4 MG SL tablet Place 1 tablet (0.4 mg total) under the tongue every 5 (five) minutes as needed for chest pain. 25 tablet 3   pioglitazone (ACTOS) 30 MG tablet TAKE 1 TABLET BY MOUTH EVERY DAY 90 tablet 0   potassium chloride (KLOR-CON M10) 10 MEQ tablet TAKE 1 TABLET (10 MEQ TOTAL) BY MOUTH EVERY EVENING. 90 tablet 0   Current Facility-Administered Medications  Medication Dose Route Frequency Provider Last Rate Last Admin   sodium chloride flush (NS) 0.9 % injection 3 mL  3 mL Intravenous Q12H , Alphonse Guild, MD         Past Surgical History:  Procedure Laterality Date   ABDOMINAL HYSTERECTOMY     BREAST EXCISIONAL BIOPSY     CESAREAN SECTION     3   CHOLECYSTECTOMY  2018   CORONARY STENT PLACEMENT  10/2017   [facility]  hospital)   LEFT HEART CATH AND CORONARY ANGIOGRAPHY N/A 02/25/2019   Procedure: LEFT HEART CATH AND CORONARY ANGIOGRAPHY;  Surgeon: Nelva Bush, MD;  Location: [facility];  Service: Cardiovascular;  Laterality: N/A;   REPLACEMENT TOTAL KNEE BILATERAL Bilateral    done in [place] (2003/2005)   RIGHT/LEFT HEART CATH AND CORONARY ANGIOGRAPHY N/A 09/22/2020   Procedure: RIGHT/LEFT HEART CATH AND CORONARY ANGIOGRAPHY;  Surgeon: Leonie Man, MD;  Location: [facility];  Service: Cardiovascular;  Laterality: N/A;     Allergies  Allergen Reactions   Aspartame Rash   Penicillins Itching and Rash    Did it involve swelling of the face/tongue/throat, SOB, or low BP? No Did it involve sudden or severe rash/hives, skin peeling, or any reaction on the inside of your mouth or nose? No Did you need to seek medical attention at a hospital or doctor's office? No When did it last happen?~25 years ago       If all above answers are "NO", may proceed with cephalosporin use.    Wilder Glade [Dapagliflozin]     UTIs   Metformin And Related     GI distress, dizziness   No Healthtouch Food Allergies     Honey-Rash Artificial Sweeteners-rash   Paxil [Paroxetine Hcl] Other (See Comments)    Twitching/feels like skin crawling   Prozac [Fluoxetine Hcl] Other (See Comments)    Twitching/feels like skin crawling   Zoloft [Sertraline Hcl] Other (See Comments)    Twitching/feels like skin crawling   Prednisone Palpitations   Ranexa [Ranolazine] Palpitations    Heart racing      Family History  Problem Relation Age of Onset   Heart disease Mother    Stroke Mother    Hypertension Mother    Cancer Father    Stroke Father  Social History Ms. Dehart reports that she has never smoked. She has never used smokeless tobacco. Ms. Luallen reports no history of alcohol use.   Review of Systems CONSTITUTIONAL: No weight loss, fever, chills, weakness or fatigue.  HEENT: Eyes: No  visual loss, blurred vision, double vision or yellow sclerae.No hearing loss, sneezing, congestion, runny nose or sore throat.  SKIN: No rash or itching.  CARDIOVASCULAR: per hpi RESPIRATORY: per hpi GASTROINTESTINAL: No anorexia, nausea, vomiting or diarrhea. No abdominal pain or blood.  GENITOURINARY: No burning on urination, no polyuria NEUROLOGICAL: No headache, dizziness, syncope, paralysis, ataxia, numbness or tingling in the extremities. No change in bowel or bladder control.  MUSCULOSKELETAL: No muscle, back pain, joint pain or stiffness.  LYMPHATICS: No enlarged nodes. No history of splenectomy.  PSYCHIATRIC: No history of depression or anxiety.  ENDOCRINOLOGIC: No reports of sweating, cold or heat intolerance. No polyuria or polydipsia.  Marland Kitchen   Physical Examination Today's Vitals   09/15/21 1003  BP: 106/60  Pulse: 60  SpO2: 98%  Weight: 240 lb 12.8 oz (109.2 kg)  Height: '5\' 3"'$  (1.6 m)   Body mass index is 42.66 kg/m.  Gen: resting comfortably, no acute distress HEENT: no scleral icterus, pupils equal round and reactive, no palptable cervical adenopathy,  CV: RRR, no m/r/g ,no jvd Resp: Clear to auscultation bilaterally GI: abdomen is soft, non-tender, non-distended, normal bowel sounds, no hepatosplenomegaly MSK: extremities are warm, no edema.  Skin: warm, no rash Neuro:  no focal deficits Psych: appropriate affect   Diagnostic Studies  11/2017 cath [facility] Severe >70% long mid LAD stenosis No other significant angiographic stenosis in the other coronary arteries  DES to mid LAD   Malignant hypertension - selective renal angio without any evidence of  renal artery stenosis.   Renal angio procedure:  A Tiger 4 catheter was used to selectively engage both the left and right  renal arteries and was used to perform selective angiography.    Renal artery anatomy:  The left kidney is supplied by a single renal artery which has no  angiographic  stenosis  The right kidney is supplied by a single rena artery which has no  angiographic stenosis     Jan 2021 Mild, non-obstructive coronary artery disease. Widely patent proximal/mid LAD stent. Normal left ventricular systolic function with upper normal to mildly elevated filling pressure (LVEDP ~15 mmHg).   06/2020 echo  1. Left ventricular ejection fraction, by estimation, is 65 to 70%. The  left ventricle has normal function. Left ventricular endocardial border  not optimally defined to evaluate regional wall motion. There is mild left  ventricular hypertrophy. Left  ventricular diastolic parameters are consistent with Grade II diastolic  dysfunction (pseudonormalization). Normal global longitudinal strain of  -19.8%.   2. Right ventricular systolic function is normal. The right ventricular  size is normal. There is normal pulmonary artery systolic pressure. The  estimated right ventricular systolic pressure is [id number] mmHg.   3. Left atrial size was mildly dilated.   4. Right atrial size was mildly dilated.   5. The mitral valve is grossly normal. Trivial mitral valve  regurgitation.   6. The aortic valve has an indeterminant number of cusps. Aortic valve  regurgitation is not visualized.   7. The inferior vena cava is normal in size with greater than 50%  respiratory variability, suggesting right atrial pressure of 3 mmHg.     09/2020 RHC/LHC: LM patent, patent LAD stent, ramus patent, LCX patent, RCA 20% Mean  PA 21 PCWP 16 CI 4.08   Assessment and Plan  1. CAD with other forms of angina.  - history of prior stent to LAD - has had chronic intermittent chet pains.  - Jan 2021 and Aug 2022 caths without significant disease.  - due to dizziness lower lisinopril to '20mg'$ , isosorbide to '30mg'$  bid     2. Chronic diastolic HF - continue current meds, stable           Arnoldo Lenis, M.D.

## 2021-09-15 NOTE — Patient Instructions (Addendum)
Medication Instructions:  Your physician has recommended you make the following change in your medication:  Decrease lisinopril to 20 mg once a day Decrease isosorbide to 30 mg twice a day Continue all other medications as directed  Labwork: none  Testing/Procedures: none  Follow-Up:  Your physician recommends that you schedule a follow-up appointment in: 2 months  Any Other Special Instructions Will Be Listed Below (If Applicable).  If you need a refill on your cardiac medications before your next appointment, please call your pharmacy.

## 2021-09-16 ENCOUNTER — Ambulatory Visit (INDEPENDENT_AMBULATORY_CARE_PROVIDER_SITE_OTHER): Payer: Medicare HMO | Admitting: Pharmacist

## 2021-09-16 DIAGNOSIS — J4540 Moderate persistent asthma, uncomplicated: Secondary | ICD-10-CM

## 2021-09-16 DIAGNOSIS — I499 Cardiac arrhythmia, unspecified: Secondary | ICD-10-CM | POA: Diagnosis not present

## 2021-09-16 DIAGNOSIS — I10 Essential (primary) hypertension: Secondary | ICD-10-CM

## 2021-09-16 MED ORDER — ALBUTEROL SULFATE HFA 108 (90 BASE) MCG/ACT IN AERS: INHALATION_SPRAY | RESPIRATORY_TRACT | 3 refills | Status: DC

## 2021-09-16 NOTE — Patient Instructions (Addendum)
Visit Information  Following are the goals we discussed today:  Current Barriers:  Unable to independently afford treatment regimen Unable to maintain control of T2DM Suboptimal therapeutic regimen for T2DM  Pharmacist Clinical Goal(s):  patient will verbalize ability to afford treatment regimen maintain control of T2DM, HLD as evidenced by GOAL A1C, MORE EFFICACIOUS REGIMEN  through collaboration with PharmD and provider.   Interventions: 1:1 collaboration with Janora Norlander, DO regarding development and update of comprehensive plan of care as evidenced by provider attestation and co-signature Inter-disciplinary care team collaboration (see longitudinal plan of care) Comprehensive medication review performed; medication list updated in electronic medical record  Diabetes: Goal on Track (progressing): YES. Controlled-A1C 6.1%; current treatment: TRESIBA, PIOGLITAZONE;  Would like to optimize regimen to provide CV, kidney benefit, however patient intolerant list makes this challenging Considering GLP1 therapy to d/c pioglitazone CONTINUE TRESIBA, pioglitazone FBG 129, no hypo reported Current glucose readings: fasting glucose: n/a' post prandial glucose: n/a Patient not testing at home Concerned for hypoglycemia Patient has backed down by 1 unit on insulin -- no hypoglycemia reported; patient feels dizzy, but it is likely ENT related--multiple work ups by other specialties Denies hypoglycemic/hyperglycemic symptoms Discussed meal planning options and Plate method for healthy eating Avoid sugary drinks and desserts Incorporate balanced protein, non starchy veggies, 1 serving of carbohydrate with each meal Increase water intake Increase physical activity as able Current exercise: encouraged Educated on diet, medications Assessed patient finances. Will enroll in [employer] patient assistance for breztri inhaler  Hyperlipidemia:  Goal on Track (progressing): YES. Controlled; current  treatment:atorvastatin;  Medications previously tried: n/a  Current dietary patterns: ENCOURAGED HEART HEALTHY to decrease triglycerides/cholesterol Educated on DIET/EXERICISE  ASTHMA -symbicort-->Breztri (plus has nebs on board when unable to use inhalers)--enrolled in az[employer]me patient assistance program; RE-ENROLLMENT SENT FOR 2023 TO [employer] -Uses rescue albuterol occasionally -Uses CPAP at night -continue current management per pulm  Patient Goals/Self-Care Activities patient will:  - take medications as prescribed as evidenced by patient report and record review check glucose DAILY/FASTING, document, and provide at future appointments collaborate with provider on medication access solutions target a minimum of 150 minutes of moderate intensity exercise weekly engage in dietary modifications by FOLLOWING HEART HEALTHY DIET   Plan: Telephone follow up appointment with care management team member scheduled for:  1 month  Signature Regina Eck, PharmD, BCPS Clinical Pharmacist, [place]  II Phone [phone]   Please call the care guide team at [phone] if you need to cancel or reschedule your appointment.   The patient verbalized understanding of instructions, educational materials, and care plan provided today and DECLINED offer to receive copy of patient instructions, educational materials, and care plan. '

## 2021-09-16 NOTE — Progress Notes (Signed)
Chronic Care Management Pharmacy Note  09/16/2021 Name:  Amber Reeves MRN:  [id number] DOB:  11-24-1950  Summary: Diabetes: Goal on Track (progressing): YES. Controlled-A1C 6.1%; current treatment: TRESIBA, PIOGLITAZONE;  Would like to optimize regimen to provide CV, kidney benefit, however patient intolerant list makes this challenging Considering GLP1 therapy to d/c pioglitazone CONTINUE TRESIBA, pioglitazone FBG 129, no hypo reported Current glucose readings: fasting glucose:129 post prandial glucose: n/a Concerned for hypoglycemia Patient has backed down by 1 unit on insulin -- no hypoglycemia reported; patient feels dizzy, but it is likely ENT related--multiple work ups by other specialties Denies hypoglycemic/hyperglycemic symptoms Discussed meal planning options and Plate method for healthy eating Avoid sugary drinks and desserts Incorporate balanced protein, non starchy veggies, 1 serving of carbohydrate with each meal Increase water intake Increase physical activity as able Current exercise: encouraged Educated on diet, medications Assessed patient finances. Will enroll in az&me patient assistance for breztri inhaler  Hyperlipidemia:  Goal on Track (progressing): YES. Controlled; current treatment:atorvastatin;  Medications previously tried: n/a  Current dietary patterns: ENCOURAGED HEART HEALTHY to decrease triglycerides/cholesterol Educated on DIET/EXERICISE  ASTHMA -symbicort-->Breztri (plus has nebs on board when unable to use inhalers)--enrolled in az&me patient assistance program; RE-ENROLLMENT SENT FOR 2023 TO [facility] -Uses rescue albuterol occasionally -Uses CPAP at night -continue current management per pulm  Subjective: Amber Reeves is an 71 y.o. year old female who is a primary patient of Janora Norlander, DO.  The CCM team was consulted for assistance with disease management and care coordination needs.    Engaged with patient by telephone  for follow up visit in response to provider referral for pharmacy case management and/or care coordination services.   Consent to Services:  The patient was given information about Chronic Care Management services, agreed to services, and gave verbal consent prior to initiation of services.  Please see initial visit note for detailed documentation.   Patient Care Team: Janora Norlander, DO as PCP - General (Family Medicine) Harl Bowie Alphonse Guild, MD as PCP - Cardiology (Cardiology) Cassandria Anger, MD as Consulting Physician (Endocrinology) Lavera Guise, [facility] as Pharmacist (Family Medicine) Chesley Mires, MD as Consulting Physician (Pulmonary Disease)  Objective:  Lab Results  Component Value Date   CREATININE 1.31 (H) 07/25/2021   CREATININE 1.42 (H) 07/21/2021   CREATININE 1.16 (H) 01/28/2021    Lab Results  Component Value Date   HGBA1C 6.1 (H) 08/30/2021   Last diabetic Eye exam:  Lab Results  Component Value Date/Time   HMDIABEYEEXA No Retinopathy 08/25/2020 12:00 AM    Last diabetic Foot exam: No results found for: "HMDIABFOOTEX"      Component Value Date/Time   CHOL 151 11/12/2019 1520   TRIG 208 (H) 11/12/2019 1520   HDL 45 11/12/2019 1520   CHOLHDL 3.4 11/12/2019 1520   LDLCALC 72 11/12/2019 1520   LDLDIRECT 80 01/28/2021 1309       Latest Ref Rng & Units 07/25/2021   12:28 PM 07/21/2021    1:41 PM 11/12/2019    3:20 PM  Hepatic Function  Total Protein 6.5 - 8.1 g/dL 5.9  7.2  6.9   Albumin 3.5 - 5.0 g/dL 3.3  3.9  3.8   AST 15 - 41 U/L 18  13  22    ALT 0 - 44 U/L 16  15  23    Alk Phosphatase 38 - 126 U/L 36  45  59   Total Bilirubin 0.3 - 1.2 mg/dL 0.8  0.6  0.4   Bilirubin, Direct 0.0 - 0.2 mg/dL 0.1       Lab Results  Component Value Date/Time   TSH 4.470 08/30/2021 01:05 PM   TSH 5.130 (H) 07/21/2021 01:41 PM   FREET4 1.35 08/30/2021 01:05 PM       Latest Ref Rng & Units 07/25/2021   12:28 PM 07/21/2021    1:41 PM 06/14/2021   11:42 AM   CBC  WBC 4.0 - 10.5 K/uL 4.6  5.7  6.2   Hemoglobin 12.0 - 15.0 g/dL 12.0  11.8  11.9   Hematocrit 36.0 - 46.0 % 37.9  35.6  36.8   Platelets 150 - 400 K/uL 171  195  229     No results found for: "VD25OH"  Clinical ASCVD: No  The 10-year ASCVD risk score (Arnett DK, et al., 2019) is: 15.6%   Values used to calculate the score:     Age: 28 years     Sex: Female     Is Non-Hispanic African American: No     Diabetic: Yes     Tobacco smoker: No     Systolic Blood Pressure: 517 mmHg     Is BP treated: Yes     HDL Cholesterol: 45 mg/dL     Total Cholesterol: 151 mg/dL    Other: (CHADS2VASc if Afib, PHQ9 if depression, MMRC or CAT for COPD, ACT, DEXA)  Social History   Tobacco Use  Smoking Status Never  Smokeless Tobacco Never   BP Readings from Last 3 Encounters:  09/15/21 106/60  08/30/21 (!) 141/88  08/05/21 128/68   Pulse Readings from Last 3 Encounters:  09/15/21 60  08/30/21 61  08/05/21 64   Wt Readings from Last 3 Encounters:  09/15/21 240 lb 12.8 oz (109.2 kg)  08/30/21 241 lb 6.4 oz (109.5 kg)  08/05/21 242 lb 3.2 oz (109.9 kg)    Assessment: Review of patient past medical history, allergies, medications, health status, including review of consultants reports, laboratory and other test data, was performed as part of comprehensive evaluation and provision of chronic care management services.   SDOH:  (Social Determinants of Health) assessments and interventions performed:    CCM Care Plan  Allergies  Allergen Reactions   Aspartame Rash   Penicillins Itching and Rash    Did it involve swelling of the face/tongue/throat, SOB, or low BP? No Did it involve sudden or severe rash/hives, skin peeling, or any reaction on the inside of your mouth or nose? No Did you need to seek medical attention at a hospital or doctor's office? No When did it last happen?~25 years ago       If all above answers are "NO", may proceed with cephalosporin use.    Wilder Glade  [Dapagliflozin]     UTIs   Metformin And Related     GI distress, dizziness   No [facility] Food Allergies     Honey-Rash Artificial Sweeteners-rash   Paxil [Paroxetine Hcl] Other (See Comments)    Twitching/feels like skin crawling   Prozac [Fluoxetine Hcl] Other (See Comments)    Twitching/feels like skin crawling   Zoloft [Sertraline Hcl] Other (See Comments)    Twitching/feels like skin crawling   Prednisone Palpitations   Ranexa [Ranolazine] Palpitations    Heart racing    Medications Reviewed Today     Reviewed by Lavera Guise, [facility] (Pharmacist) on 09/16/21 at 1008  Med List Status: <None>   Medication Order Taking? Sig Documenting Provider Last Dose Status Informant  albuterol (VENTOLIN HFA) 108 (90 Base) MCG/ACT inhaler [id number] No TAKE 2 PUFFS BY MOUTH EVERY 4 HOURS AS NEEDED FOR WHEEZE Janora Norlander, DO Taking Active Self, Pharmacy Records  Alcohol Swabs (B-D SINGLE USE SWABS REGULAR) PADS [id number] No Apply topically. [provider] Taking Active Self, Pharmacy Records  amLODipine (NORVASC) 10 MG tablet [id number] No Take 1 tablet (10 mg total) by mouth daily. Arnoldo Lenis, MD Taking Active Self, Pharmacy Records  arformoterol [facility]) 15 MCG/2ML NEBU [id number] No Take 2 mLs (15 mcg total) by nebulization 2 (two) times daily. Chesley Mires, MD Taking Active   aspirin EC 81 MG tablet [id number] No Take 81 mg by mouth in the morning. [provider] Taking Active Self, Pharmacy Records  atorvastatin (LIPITOR) 40 MG tablet [id number] No Take 1 tablet (40 mg total) by mouth at bedtime. Janora Norlander, DO Taking Active Self, Pharmacy Records  Blood Glucose Calibration (ACCU-CHEK AVIVA) SOLN [id number] No 2 Bottles by Other route as needed. [provider] Taking Active Self, Pharmacy Records  Budeson-Glycopyrrol-Formoterol (BREZTRI AEROSPHERE) 160-9-4.8 MCG/ACT AERO [id number] No Inhale 2 puffs into the lungs 2 (two) times daily.  Ronnie Doss M, DO Taking Active   budesonide (PULMICORT) 0.25 MG/2ML nebulizer solution [id number] No Take 2 mLs (0.25 mg total) by nebulization 2 (two) times daily. Chesley Mires, MD Taking Active   Calcium Carb-Cholecalciferol (CALCIUM 600+D3 PO) [id number] No Take 1 tablet by mouth in the morning. [provider] Taking Active Self, Pharmacy Records  cetirizine (ZYRTEC ALLERGY) 10 MG tablet [id number] No Take 1 tablet (10 mg total) by mouth daily. Sharion Balloon, FNP Taking Active Self, Pharmacy Records  diphenhydrAMINE (BENADRYL) 25 MG tablet [id number] No Take 50 mg by mouth at bedtime. [provider] Taking Active Self, Pharmacy Records  fluticasone [facility]) 50 MCG/ACT nasal spray [id number] No Place 2 sprays into both nostrils daily. Sharion Balloon, FNP Taking Active Self, Pharmacy Records  furosemide (LASIX) 20 MG tablet [id number] No Take 1 tablet (20 mg total) by mouth as needed (based on weight - may take one extra tab for weight gain of 3lb in 24 hrs or 5lb in 1 week).  Patient taking differently: Take 20 mg by mouth daily as needed (based on weight - may take one extra tab for weight gain of 3lb in 24 hrs or 5lb in 1 week).   Verta Ellen., NP Taking Active Self, Pharmacy Records  glucose blood test strip [id number] No TEST BLOOD SUGAR TWICE DAILY [provider] Taking Active Self, Pharmacy Records  ibuprofen (ADVIL) 200 MG tablet [id number] No Take 400 mg by mouth daily as needed (pain (leg pain)). [provider] Taking Active Self, Pharmacy Records  insulin degludec (TRESIBA FLEXTOUCH) 200 UNIT/ML FlexTouch Pen [id number] No Inject 40 Units into the skin daily.  Patient taking differently: Inject 38 Units into the skin in the morning.   Janora Norlander, DO Taking Active Self, Pharmacy Records  Insulin Syringe-Needle U-100 31G X 5/16" 0.5 ML MISC [id number] No Use to inject Insulin four times daily: Use as directed; Dx: E11.9, E66.9  [provider] Taking Active Self, Pharmacy Records  ipratropium-albuterol (DUONEB) 0.5-2.5 (3) MG/3ML SOLN [id number] No TAKE 3 ML (1 VIAL) BY NEBULIZATION EVERY 6 HOURS AS NEEDED FOR WHEEZING.  Patient taking differently: Take 3 mLs by nebulization every 6 (six) hours as needed (wheezing).   Janora Norlander, DO Taking Active Self, Pharmacy Records  isosorbide mononitrate (IMDUR) 30 MG 24 hr tablet  [id number]  Take 1 tablet (30 mg total) by mouth 2 (two) times daily. Arnoldo Lenis, MD  Active   lansoprazole (PREVACID) 15 MG capsule [id number] No Take 30 mg by mouth at bedtime. [provider] Taking Active Self, Pharmacy Records  lisinopril (ZESTRIL) 20 MG tablet [id number]  Take 1 tablet (20 mg total) by mouth daily. Arnoldo Lenis, MD  Active   magnesium oxide (MAG-OX) 400 MG tablet [id number] No Take magnesium on days you take lasix Janora Norlander, DO Taking Active Self, Pharmacy Records  methocarbamol (ROBAXIN) 500 MG tablet [id number] No TAKE 0.5-1 TABLETS (250-500 MG TOTAL) 2 (TWO) TIMES DAILY AS NEEDED FOR MUSCLE SPASMS (NECK PAIN). Ronnie Doss M, DO Taking Active   metoprolol tartrate (LOPRESSOR) 25 MG tablet [id number] No Take 1 tablet (25 mg total) by mouth 2 (two) times daily. Janora Norlander, DO Taking Active Self, Pharmacy Records  montelukast (SINGULAIR) 10 MG tablet [id number] No Take 1 tablet (10 mg total) by mouth at bedtime. Janora Norlander, DO Taking Active Self, Pharmacy Records  nitroGLYCERIN (NITROSTAT) 0.4 MG SL tablet [id number]  Place 1 tablet (0.4 mg total) under the tongue every 5 (five) minutes x 3 doses as needed for chest pain (if no relief after 2nd dose, proceed to ED or call 911). Arnoldo Lenis, MD  Active   pioglitazone (ACTOS) 30 MG tablet [id number] No TAKE 1 TABLET BY MOUTH EVERY DAY Janora Norlander, DO Taking Active Self, Pharmacy Records  potassium chloride (KLOR-CON M10) 10 MEQ tablet [id number] No TAKE 1 TABLET  (10 MEQ TOTAL) BY MOUTH EVERY EVENING. Ronnie Doss M, DO Taking Active   sodium chloride flush (NS) 0.9 % injection 3 mL [id number]   Arnoldo Lenis, MD  Active             Patient Active Problem List   Diagnosis Date Noted   Angina, class III ([place])    DOE (dyspnea on exertion)    CAD S/P percutaneous coronary angioplasty    Hyperlipidemia associated with type 2 diabetes mellitus ([place]) 11/29/2019   Hypertension associated with diabetes ([place]) 11/29/2019   DM type 2 causing vascular disease ([place]) 05/20/2019   Essential hypertension, benign 05/20/2019   Mixed hyperlipidemia 05/20/2019   Coronary artery disease involving native coronary artery of native heart with angina pectoris ([place]) 01/29/2019   Stented coronary artery 02/15/2018   Dizziness 11/20/2017   Stable angina ([place]) 11/19/2017   Bipolar affective disorder ([place]) 08/31/2016   OSA on CPAP 08/31/2016   Morbid obesity due to excess calories ([place]) 01/07/2016   Left sided colitis with rectal bleeding ([place]) 07/15/2015   Environmental allergies 06/02/2014   Moderate persistent asthma without complication 00/92/3300   Depressed 05/01/2013   GERD (gastroesophageal reflux disease) 04/15/2011   History of total knee replacement 04/15/2011    Immunization History  Administered Date(s) Administered   Influenza, High Dose Seasonal PF 02/16/2017   Influenza,inj,Quad PF,6+ Mos 12/18/2013   Moderna Sars-Covid-2 Vaccination 05/01/2019, 05/29/2019, 02/18/2020    Conditions to be addressed/monitored: DMII and Asthma  Care Plan : PHARMD MEDICATION MANAGEMENT  Updates made by Lavera Guise, RPH since 09/22/2021 12:00 AM     Problem: DISEASE PROGRESSION PREVENTION      Long-Range Goal: T2DM, ASTHMA, HLD   Recent Progress: On track  Priority: High  Note:   Current Barriers:  Unable to independently afford treatment regimen Unable to maintain control of T2DM Suboptimal therapeutic regimen for T2DM  Pharmacist Clinical  Goal(s):  patient will verbalize ability to afford treatment regimen maintain control of T2DM, HLD as evidenced by GOAL A1C, MORE EFFICACIOUS REGIMEN  through collaboration with PharmD and provider.   Interventions: 1:1 collaboration with Janora Norlander, DO regarding development and update of comprehensive plan of care as evidenced by provider attestation and co-signature Inter-disciplinary care team collaboration (see longitudinal plan of care) Comprehensive medication review performed; medication list updated in electronic medical record  Diabetes: Goal on Track (progressing): YES. Controlled-A1C 6.1%; current treatment: TRESIBA, PIOGLITAZONE;  Would like to optimize regimen to provide CV, kidney benefit, however patient intolerant list makes this challenging Considering GLP1 therapy to d/c pioglitazone CONTINUE TRESIBA, pioglitazone FBG 129, no hypo reported Current glucose readings: fasting glucose: n/a' post prandial glucose: n/a Patient not testing at home Concerned for hypoglycemia Patient has backed down by 1 unit on insulin -- no hypoglycemia reported; patient feels dizzy, but it is likely ENT related--multiple work ups by other specialties Denies hypoglycemic/hyperglycemic symptoms Discussed meal planning options and Plate method for healthy eating Avoid sugary drinks and desserts Incorporate balanced protein, non starchy veggies, 1 serving of carbohydrate with each meal Increase water intake Increase physical activity as able Current exercise: encouraged Educated on diet, medications Assessed patient finances. Will enroll in az&me patient assistance for breztri inhaler  Hyperlipidemia:  Goal on Track (progressing): YES. Controlled; current treatment:atorvastatin;  Medications previously tried: n/a  Current dietary patterns: ENCOURAGED HEART HEALTHY to decrease triglycerides/cholesterol Educated on DIET/EXERICISE  ASTHMA -symbicort-->Breztri (plus has nebs on board  when unable to use inhalers)--enrolled in az&me patient assistance program; RE-ENROLLMENT SENT FOR 2023 TO AZ&ME -Uses rescue albuterol occasionally -Uses CPAP at night -continue current management per pulm  Patient Goals/Self-Care Activities patient will:  - take medications as prescribed as evidenced by patient report and record review check glucose DAILY/FASTING, document, and provide at future appointments collaborate with provider on medication access solutions target a minimum of 150 minutes of moderate intensity exercise weekly engage in dietary modifications by FOLLOWING HEART HEALTHY DIET      Follow Up:  Patient agrees to Care Plan and Follow-up.  Plan: Telephone follow up appointment with care management team member scheduled for:  4 weeks  Regina Eck, PharmD, BCPS Clinical Pharmacist, [place]  II Phone [phone]

## 2021-09-19 ENCOUNTER — Ambulatory Visit
Admission: RE | Admit: 2021-09-19 | Discharge: 2021-09-19 | Disposition: A | Discharge status: Home / Self Care | Payer: Medicare HMO | Source: Ambulatory Visit | Attending: Otolaryngology | Admitting: Otolaryngology

## 2021-09-19 DIAGNOSIS — M4602 Spinal enthesopathy, cervical region: Secondary | ICD-10-CM | POA: Diagnosis not present

## 2021-09-19 DIAGNOSIS — I6782 Cerebral ischemia: Secondary | ICD-10-CM | POA: Diagnosis not present

## 2021-09-19 DIAGNOSIS — H9121 Sudden idiopathic hearing loss, right ear: Secondary | ICD-10-CM

## 2021-09-19 DIAGNOSIS — H9190 Unspecified hearing loss, unspecified ear: Secondary | ICD-10-CM | POA: Diagnosis not present

## 2021-09-19 DIAGNOSIS — R296 Repeated falls: Secondary | ICD-10-CM | POA: Diagnosis not present

## 2021-09-19 MED ORDER — GADOBENATE DIMEGLUMINE 529 MG/ML IV SOLN
20.0000 mL | Freq: Once | INTRAVENOUS | Status: AC | PRN
  Administered 2021-09-19: 20 mL via INTRAVENOUS

## 2021-09-20 DIAGNOSIS — J4540 Moderate persistent asthma, uncomplicated: Secondary | ICD-10-CM

## 2021-09-20 DIAGNOSIS — I10 Essential (primary) hypertension: Secondary | ICD-10-CM

## 2021-09-22 MED ORDER — BREZTRI AEROSPHERE 160-9-4.8 MCG/ACT IN AERO: 2.0000 | INHALATION_SPRAY | Freq: Two times a day (BID) | RESPIRATORY_TRACT | 11 refills | Status: DC

## 2021-09-23 ENCOUNTER — Telehealth: Payer: Self-pay | Admitting: Cardiology

## 2021-09-23 ENCOUNTER — Telehealth: Payer: Self-pay | Admitting: Family Medicine

## 2021-09-23 NOTE — Telephone Encounter (Signed)
Please review and advise.

## 2021-09-23 NOTE — Telephone Encounter (Signed)
Patient is calling to get results from heart monitor and also MRI scan. Please call to discuss with patient

## 2021-09-23 NOTE — Telephone Encounter (Signed)
Informed patient that she needs to call the ordering physician (pcp) for her results as we did not order these test.  She verbalized understanding.

## 2021-09-23 NOTE — Telephone Encounter (Signed)
Daughter is calling--she says that she believes that the medications that the pt is on listed below are causing pt to be extremely dizzy.  lisinopril (ZESTRIL) 20 MG tablet insulin degludec (TRESIBA FLEXTOUCH) 200 UNIT/ML FlexTouch Pen  pioglitazone (ACTOS) 30 MG tablet and  amLODipine (NORVASC) 10 MG tablet  Daughter thinks that the insulin and heart medications are the main cause. Daughter is asking if Lajuana Ripple can look over MRI from 09/19/2021 also. Pt is aware Lajuana Ripple is off today and will call back tomorrow. wPlease call back

## 2021-09-24 NOTE — Telephone Encounter (Signed)
Pt aware to try to cut actos in half

## 2021-09-24 NOTE — Telephone Encounter (Signed)
Daughter calling back checking on message that was left yesterday. Looks like Dr. Darnell Level addressed it and nobody has called back.

## 2021-09-29 ENCOUNTER — Telehealth: Payer: Self-pay

## 2021-09-29 ENCOUNTER — Telehealth: Payer: Self-pay | Admitting: Pulmonary Disease

## 2021-09-29 ENCOUNTER — Other Ambulatory Visit: Payer: Self-pay | Admitting: Family Medicine

## 2021-09-29 DIAGNOSIS — H9121 Sudden idiopathic hearing loss, right ear: Secondary | ICD-10-CM | POA: Diagnosis not present

## 2021-09-29 DIAGNOSIS — R42 Dizziness and giddiness: Secondary | ICD-10-CM | POA: Diagnosis not present

## 2021-09-29 DIAGNOSIS — I671 Cerebral aneurysm, nonruptured: Secondary | ICD-10-CM

## 2021-09-29 DIAGNOSIS — H9311 Tinnitus, right ear: Secondary | ICD-10-CM | POA: Diagnosis not present

## 2021-09-29 NOTE — Telephone Encounter (Signed)
Pt seen ent today and they are requesting you send a referral to neurology regarding aneurysm

## 2021-09-29 NOTE — Telephone Encounter (Signed)
done

## 2021-09-29 NOTE — Telephone Encounter (Signed)
Noted  

## 2021-09-29 NOTE — Telephone Encounter (Signed)
Called and spoke with pt who states she does not want to have the sleep study performed. States she is currently dealing with other health issues and due to her other health issues is why she wants to hold off on sleep study. Routing to Dr. Halford Chessman as an Juluis Rainier.

## 2021-10-07 ENCOUNTER — Telehealth: Payer: Self-pay | Admitting: Family Medicine

## 2021-10-07 NOTE — Telephone Encounter (Signed)
Patient aware that we have received her [place] Plate from patient assistance. We received 3 boxes.

## 2021-10-08 ENCOUNTER — Other Ambulatory Visit: Payer: Self-pay | Admitting: Family Medicine

## 2021-10-08 DIAGNOSIS — E1165 Type 2 diabetes mellitus with hyperglycemia: Secondary | ICD-10-CM

## 2021-10-11 DIAGNOSIS — Z6841 Body Mass Index (BMI) 40.0 and over, adult: Secondary | ICD-10-CM | POA: Diagnosis not present

## 2021-10-11 DIAGNOSIS — I671 Cerebral aneurysm, nonruptured: Secondary | ICD-10-CM | POA: Diagnosis not present

## 2021-10-12 ENCOUNTER — Other Ambulatory Visit: Payer: Self-pay | Admitting: Family Medicine

## 2021-10-12 ENCOUNTER — Other Ambulatory Visit: Payer: Self-pay | Admitting: Neurosurgery

## 2021-10-12 DIAGNOSIS — I671 Cerebral aneurysm, nonruptured: Secondary | ICD-10-CM

## 2021-10-13 ENCOUNTER — Other Ambulatory Visit: Payer: Self-pay | Admitting: Neurosurgery

## 2021-10-20 ENCOUNTER — Ambulatory Visit (INDEPENDENT_AMBULATORY_CARE_PROVIDER_SITE_OTHER): Payer: Medicare HMO | Admitting: Pharmacist

## 2021-10-20 DIAGNOSIS — E1159 Type 2 diabetes mellitus with other circulatory complications: Secondary | ICD-10-CM

## 2021-10-20 DIAGNOSIS — I152 Hypertension secondary to endocrine disorders: Secondary | ICD-10-CM

## 2021-10-20 NOTE — Progress Notes (Signed)
Chronic Care Management Pharmacy Note  10/20/2021 Name:  Latonda Larrivee MRN:  [id number] DOB:  03/12/1950  Summary: Diabetes: Goal on Track (progressing): YES. Controlled-A1C 6.1%; current treatment: TRESIBA 32 units daily, PIOGLITAZONE;  Would like to optimize regimen to provide CV, kidney benefit, however patient intolerant list makes this challenging Considering GLP1 therapy to d/c pioglitazone CONTINUE TRESIBA, pioglitazone FBG 80-120; denied hypoglycemia Current glucose readings: fasting glucose: n/a' post prandial glucose: n/a FBG 80-120 No more concern for hypoglycemia Denies hypoglycemic/hyperglycemic symptoms Discussed meal planning options and Plate method for healthy eating Avoid sugary drinks and desserts Incorporate balanced protein, non starchy veggies, 1 serving of carbohydrate with each meal Increase water intake Increase physical activity as able Current exercise: encouraged Educated on diet, medications Assessed patient finances. Will enroll in az&me patient assistance for breztri inhaler  Hyperlipidemia:  Goal on Track (progressing): YES. Controlled; current treatment:atorvastatin;  Medications previously tried: n/a  Current dietary patterns: ENCOURAGED HEART HEALTHY to decrease triglycerides/cholesterol Educated on DIET/EXERICISE  ASTHMA -symbicort-->Breztri (plus has nebs on board when unable to use inhalers)--enrolled in az&me patient assistance program; RE-ENROLLMENT SENT FOR 2023 TO [facility] -Uses rescue albuterol occasionally -Uses CPAP at night -continue current management per pulm  Patient Goals/Self-Care Activities patient will:  - take medications as prescribed as evidenced by patient report and record review check glucose DAILY/FASTING, document, and provide at future appointments collaborate with provider on medication access solutions target a minimum of 150 minutes of moderate intensity exercise weekly engage in dietary modifications by  FOLLOWING HEART HEALTHY DIET   Subjective: Deziyah Arvin is an 71 y.o. year old female who is a primary patient of Janora Norlander, DO.  The CCM team was consulted for assistance with disease management and care coordination needs.    Engaged with patient by telephone for follow up visit in response to provider referral for pharmacy case management and/or care coordination services.   Consent to Services:  The patient was given information about Chronic Care Management services, agreed to services, and gave verbal consent prior to initiation of services.  Please see initial visit note for detailed documentation.   Patient Care Team: Janora Norlander, DO as PCP - General (Family Medicine) Harl Bowie, Alphonse Guild, MD as PCP - Cardiology (Cardiology) Cassandria Anger, MD as Consulting Physician (Endocrinology) Lavera Guise, [facility] as Pharmacist (Family Medicine) Chesley Mires, MD as Consulting Physician (Pulmonary Disease)   Objective:  Lab Results  Component Value Date   CREATININE 1.31 (H) 07/25/2021   CREATININE 1.42 (H) 07/21/2021   CREATININE 1.16 (H) 01/28/2021    Lab Results  Component Value Date   HGBA1C 6.1 (H) 08/30/2021   Last diabetic Eye exam:  Lab Results  Component Value Date/Time   HMDIABEYEEXA No Retinopathy 08/25/2020 12:00 AM    Last diabetic Foot exam: No results found for: "HMDIABFOOTEX"      Component Value Date/Time   CHOL 151 11/12/2019 1520   TRIG 208 (H) 11/12/2019 1520   HDL 45 11/12/2019 1520   CHOLHDL 3.4 11/12/2019 1520   LDLCALC 72 11/12/2019 1520   LDLDIRECT 80 01/28/2021 1309       Latest Ref Rng & Units 07/25/2021   12:28 PM 07/21/2021    1:41 PM 11/12/2019    3:20 PM  Hepatic Function  Total Protein 6.5 - 8.1 g/dL 5.9  7.2  6.9   Albumin 3.5 - 5.0 g/dL 3.3  3.9  3.8   AST 15 - 41 U/L 18  13  22   ALT 0 - 44 U/L _0 Alk Phosphatase 38 - 126 U/L 36  45  59   Total Bilirubin 0.3 - 1.2 mg/dL 0.8  0.6  0.4    Bilirubin, Direct 0.0 - 0.2 mg/dL 0.1       Lab Results  Component Value Date/Time   TSH 4.470 08/30/2021 01:05 PM   TSH 5.130 (H) 07/21/2021 01:41 PM   FREET4 1.35 08/30/2021 01:05 PM       Latest Ref Rng & Units 07/25/2021   12:28 PM 07/21/2021    1:41 PM 06/14/2021   11:42 AM  CBC  WBC 4.0 - 10.5 K/uL 4.6  5.7  6.2   Hemoglobin 12.0 - 15.0 g/dL 12.0  11.8  11.9   Hematocrit 36.0 - 46.0 % 37.9  35.6  36.8   Platelets 150 - 400 K/uL 171  195  229     No results found for: "VD25OH"  Clinical ASCVD: No  The 10-year ASCVD risk score (Arnett DK, et al., 2019) is: 15.6%   Values used to calculate the score:     Age: 3 years     Sex: Female     Is Non-Hispanic African American: No     Diabetic: Yes     Tobacco smoker: No     Systolic Blood Pressure: 161 mmHg     Is BP treated: Yes     HDL Cholesterol: 45 mg/dL     Total Cholesterol: 151 mg/dL    Other: (CHADS2VASc if Afib, PHQ9 if depression, MMRC or CAT for COPD, ACT, DEXA)  Social History   Tobacco Use  Smoking Status Never  Smokeless Tobacco Never   BP Readings from Last 3 Encounters:  09/15/21 106/60  08/30/21 (!) 141/88  08/05/21 128/68   Pulse Readings from Last 3 Encounters:  09/15/21 60  08/30/21 61  08/05/21 64   Wt Readings from Last 3 Encounters:  09/15/21 240 lb 12.8 oz (109.2 kg)  08/30/21 241 lb 6.4 oz (109.5 kg)  08/05/21 242 lb 3.2 oz (109.9 kg)    Assessment: Review of patient past medical history, allergies, medications, health status, including review of consultants reports, laboratory and other test data, was performed as part of comprehensive evaluation and provision of chronic care management services.   SDOH:  (Social Determinants of Health) assessments and interventions performed:    CCM Care Plan  Allergies  Allergen Reactions   Aspartame Rash   Penicillins Itching and Rash    Did it involve swelling of the face/tongue/throat, SOB, or low BP? No Did it involve sudden or  severe rash/hives, skin peeling, or any reaction on the inside of your mouth or nose? No Did you need to seek medical attention at a hospital or doctor's office? No When did it last happen?~25 years ago       If all above answers are "NO", may proceed with cephalosporin use.    Wilder Glade [Dapagliflozin]     UTIs   Metformin And Related     GI distress, dizziness   No [facility] Food Allergies     Honey-Rash Artificial Sweeteners-rash   Paxil [Paroxetine Hcl] Other (See Comments)    Twitching/feels like skin crawling   Prozac [Fluoxetine Hcl] Other (See Comments)    Twitching/feels like skin crawling   Zoloft [Sertraline Hcl] Other (See Comments)    Twitching/feels like skin crawling   Prednisone Palpitations   Ranexa [Ranolazine] Palpitations    Heart racing  Medications Reviewed Today     Reviewed by Lavera Guise, [facility] (Pharmacist) on 10/20/21 at 1521  Med List Status: <None>   Medication Order Taking? Sig Documenting Provider Last Dose Status Informant  albuterol (VENTOLIN HFA) 108 (90 Base) MCG/ACT inhaler [id number]  TAKE 2 PUFFS BY MOUTH EVERY 4 HOURS AS NEEDED FOR WHEEZE Ronnie Doss M, DO  Active   Alcohol Swabs (B-D SINGLE USE SWABS REGULAR) PADS [id number] No Apply topically. [provider] Taking Active Self, Pharmacy Records  amLODipine (NORVASC) 10 MG tablet [id number] No Take 1 tablet (10 mg total) by mouth daily. Arnoldo Lenis, MD Taking Active Self, Pharmacy Records  arformoterol [facility])) 15 MCG/2ML NEBU [id number] No Take 2 mLs (15 mcg total) by nebulization 2 (two) times daily. Chesley Mires, MD Taking Active            Med Note Blanca Friend, Sherian Maroon Sep 16, 2021 10:25 AM) Not taking; can't afford via part B  aspirin EC 81 MG tablet [id number] No Take 81 mg by mouth in the morning. [provider] Taking Active Self, Pharmacy Records  atorvastatin (LIPITOR) 40 MG tablet [id number] No Take 1 tablet (40 mg total) by mouth at bedtime.  Janora Norlander, DO Taking Active Self, Pharmacy Records  Blood Glucose Calibration (ACCU-CHEK AVIVA) SOLN [id number] No 2 Bottles by Other route as needed. [provider] Taking Active Self, Pharmacy Records  Budeson-Glycopyrrol-Formoterol (BREZTRI AEROSPHERE) 160-9-4.8 MCG/ACT Hollie Salk [id number]  Inhale 2 puffs into the lungs 2 (two) times daily. Ronnie Doss M, DO  Active   budesonide (PULMICORT) 0.25 MG/2ML nebulizer solution [id number] No Take 2 mLs (0.25 mg total) by nebulization 2 (two) times daily. Chesley Mires, MD Taking Active   Calcium Carb-Cholecalciferol (CALCIUM 600+D3 PO) [id number] No Take 1 tablet by mouth in the morning. [provider] Taking Active Self, Pharmacy Records  cetirizine (ZYRTEC ALLERGY) 10 MG tablet [id number] No Take 1 tablet (10 mg total) by mouth daily. Sharion Balloon, FNP Taking Active Self, Pharmacy Records  diphenhydrAMINE (BENADRYL) 25 MG tablet [id number] No Take 50 mg by mouth at bedtime. [provider] Taking Active Self, Pharmacy Records  fluticasone [facility]) 50 MCG/ACT nasal spray [id number] No Place 2 sprays into both nostrils daily. Sharion Balloon, FNP Taking Active Self, Pharmacy Records  furosemide (LASIX) 20 MG tablet [id number] No Take 1 tablet (20 mg total) by mouth as needed (based on weight - may take one extra tab for weight gain of 3lb in 24 hrs or 5lb in 1 week).  Patient taking differently: Take 20 mg by mouth daily as needed (based on weight - may take one extra tab for weight gain of 3lb in 24 hrs or 5lb in 1 week).   Verta Ellen., NP Taking Active Self, Pharmacy Records  glucose blood test strip [id number] No TEST BLOOD SUGAR TWICE DAILY [provider] Taking Active Self, Pharmacy Records  ibuprofen (ADVIL) 200 MG tablet [id number] No Take 400 mg by mouth daily as needed (pain (leg pain)). [provider] Taking Active Self, Pharmacy Records  insulin degludec (TRESIBA FLEXTOUCH) 200  UNIT/ML FlexTouch Pen [id number] No Inject 40 Units into the skin daily.  Patient taking differently: Inject 38 Units into the skin in the morning.   Janora Norlander, DO Taking Active Self, Pharmacy Records  Insulin Syringe-Needle U-100 31G X 5/16" 0.5 ML MISC [id number] No Use to inject Insulin four times daily: Use as directed; Dx: E11.9, E66.9 [provider]  Taking Active Self, Pharmacy Records  ipratropium-albuterol (DUONEB) 0.5-2.5 (3) MG/3ML SOLN [id number] No TAKE 3 ML (1 VIAL) BY NEBULIZATION EVERY 6 HOURS AS NEEDED FOR WHEEZING.  Patient taking differently: Take 3 mLs by nebulization every 6 (six) hours as needed (wheezing).   Janora Norlander, DO Taking Active Self, Pharmacy Records  isosorbide mononitrate (IMDUR) 30 MG 24 hr tablet [id number]  Take 1 tablet (30 mg total) by mouth 2 (two) times daily. Arnoldo Lenis, MD  Active   lansoprazole (PREVACID) 15 MG capsule [id number] No Take 30 mg by mouth at bedtime. [provider] Taking Active Self, Pharmacy Records  lisinopril (ZESTRIL) 20 MG tablet [id number]  Take 1 tablet (20 mg total) by mouth daily. Arnoldo Lenis, MD  Active   magnesium oxide (MAG-OX) 400 MG tablet [id number] No Take magnesium on days you take lasix Janora Norlander, DO Taking Active Self, Pharmacy Records  methocarbamol (ROBAXIN) 500 MG tablet [id number] No TAKE 0.5-1 TABLETS (250-500 MG TOTAL) 2 (TWO) TIMES DAILY AS NEEDED FOR MUSCLE SPASMS (NECK PAIN). Ronnie Doss M, DO Taking Active   metoprolol tartrate (LOPRESSOR) 25 MG tablet [id number] No Take 1 tablet (25 mg total) by mouth 2 (two) times daily. Janora Norlander, DO Taking Active Self, Pharmacy Records  montelukast (SINGULAIR) 10 MG tablet [id number] No Take 1 tablet (10 mg total) by mouth at bedtime. Janora Norlander, DO Taking Active Self, Pharmacy Records  nitroGLYCERIN (NITROSTAT) 0.4 MG SL tablet [id number]  Place 1 tablet (0.4 mg total) under the tongue every 5 (five)  minutes x 3 doses as needed for chest pain (if no relief after 2nd dose, proceed to ED or call 911). Arnoldo Lenis, MD  Active   pioglitazone (ACTOS) 30 MG tablet [id number]  TAKE 1 TABLET BY MOUTH EVERY DAY Ronnie Doss M, DO  Active   potassium chloride (KLOR-CON M10) 10 MEQ tablet [id number]  TAKE 1 TABLET (10 MEQ TOTAL) BY MOUTH EVERY EVENING. Ronnie Doss M, DO  Active   sodium chloride flush (NS) 0.9 % injection 3 mL [id number]   Arnoldo Lenis, MD  Active             Patient Active Problem List   Diagnosis Date Noted   Angina, class III ([place])    DOE (dyspnea on exertion)    CAD S/P percutaneous coronary angioplasty    Hyperlipidemia associated with type 2 diabetes mellitus ([place]) 11/29/2019   Hypertension associated with diabetes ([place]) 11/29/2019   DM type 2 causing vascular disease ([place]) 05/20/2019   Essential hypertension, benign 05/20/2019   Mixed hyperlipidemia 05/20/2019   Coronary artery disease involving native coronary artery of native heart with angina pectoris ([place]) 01/29/2019   Stented coronary artery 02/15/2018   Dizziness 11/20/2017   Stable angina ([place]) 11/19/2017   Bipolar affective disorder ([place]) 08/31/2016   OSA on CPAP 08/31/2016   Morbid obesity due to excess calories ([place]) 01/07/2016   Left sided colitis with rectal bleeding ([place]) 07/15/2015   Environmental allergies 06/02/2014   Moderate persistent asthma without complication 02/09/7587   Depressed 05/01/2013   GERD (gastroesophageal reflux disease) 04/15/2011   History of total knee replacement 04/15/2011    Immunization History  Administered Date(s) Administered   Influenza, High Dose Seasonal PF 02/16/2017   Influenza,inj,Quad PF,6+ Mos 12/18/2013   Moderna Sars-Covid-2 Vaccination 05/01/2019, 05/29/2019, 02/18/2020    Conditions to be addressed/monitored: COPD and DMII  Care Plan : PHARMD MEDICATION MANAGEMENT  Updates made by Lavera Guise,  [place] since 10/20/2021 12:00 AM      Problem: DISEASE PROGRESSION PREVENTION      Long-Range Goal: T2DM, ASTHMA, HLD   Recent Progress: On track  Priority: High  Note:   Current Barriers:  Unable to independently afford treatment regimen Unable to maintain control of T2DM Suboptimal therapeutic regimen for T2DM  Pharmacist Clinical Goal(s):  patient will verbalize ability to afford treatment regimen maintain control of T2DM, HLD as evidenced by GOAL A1C, MORE EFFICACIOUS REGIMEN  through collaboration with PharmD and provider.   Interventions: 1:1 collaboration with Janora Norlander, DO regarding development and update of comprehensive plan of care as evidenced by provider attestation and co-signature Inter-disciplinary care team collaboration (see longitudinal plan of care) Comprehensive medication review performed; medication list updated in electronic medical record  Diabetes: Goal on Track (progressing): YES. Controlled-A1C 6.1%; current treatment: TRESIBA 32 units daily, PIOGLITAZONE;  Would like to optimize regimen to provide CV, kidney benefit, however patient intolerant list makes this challenging Considering GLP1 therapy to d/c pioglitazone CONTINUE TRESIBA, pioglitazone FBG 70-120; denied hypoglycemia Current glucose readings: fasting glucose: n/a' post prandial glucose: n/a FBG 70-120 No more concern for hypoglycemia Denies hypoglycemic/hyperglycemic symptoms Discussed meal planning options and Plate method for healthy eating Avoid sugary drinks and desserts Incorporate balanced protein, non starchy veggies, 1 serving of carbohydrate with each meal Increase water intake Increase physical activity as able Current exercise: encouraged Educated on diet, medications Assessed patient finances. Will enroll in az&me patient assistance for breztri inhaler  Hyperlipidemia:  Goal on Track (progressing): YES. Controlled; current treatment:atorvastatin;  Medications previously tried: n/a  Current  dietary patterns: ENCOURAGED HEART HEALTHY to decrease triglycerides/cholesterol Educated on DIET/EXERICISE  ASTHMA -symbicort-->Breztri (plus has nebs on board when unable to use inhalers)--enrolled in az&me patient assistance program; RE-ENROLLMENT SENT FOR 2023 TO [facility] -Uses rescue albuterol occasionally -Uses CPAP at night -continue current management per pulm  Patient Goals/Self-Care Activities patient will:  - take medications as prescribed as evidenced by patient report and record review check glucose DAILY/FASTING, document, and provide at future appointments collaborate with provider on medication access solutions target a minimum of 150 minutes of moderate intensity exercise weekly engage in dietary modifications by FOLLOWING HEART HEALTHY DIET      Plan: Next PCP appointment scheduled for:  12/29/2021   Regina Eck, PharmD, BCPS Clinical Pharmacist, [place]  II Phone [phone]

## 2021-10-20 NOTE — Patient Instructions (Addendum)
Visit Information  Following are the goals we discussed today:  Current Barriers:  Unable to independently afford treatment regimen Unable to maintain control of T2DM Suboptimal therapeutic regimen for T2DM  Pharmacist Clinical Goal(s):  patient will verbalize ability to afford treatment regimen maintain control of T2DM, HLD as evidenced by GOAL A1C, MORE EFFICACIOUS REGIMEN  through collaboration with PharmD and provider.   Interventions: 1:1 collaboration with Janora Norlander, DO regarding development and update of comprehensive plan of care as evidenced by provider attestation and co-signature Inter-disciplinary care team collaboration (see longitudinal plan of care) Comprehensive medication review performed; medication list updated in electronic medical record  Diabetes: Goal on Track (progressing): YES. Controlled-A1C 6.1%; current treatment: TRESIBA 32 units daily, PIOGLITAZONE;  Would like to optimize regimen to provide CV, kidney benefit, however patient intolerant list makes this challenging Considering GLP1 therapy to d/c pioglitazone CONTINUE TRESIBA, pioglitazone FBG 70-120; denied hypoglycemia Current glucose readings: fasting glucose: n/a' post prandial glucose: n/a FBG 70-120 No more concern for hypoglycemia Denies hypoglycemic/hyperglycemic symptoms Discussed meal planning options and Plate method for healthy eating Avoid sugary drinks and desserts Incorporate balanced protein, non starchy veggies, 1 serving of carbohydrate with each meal Increase water intake Increase physical activity as able Current exercise: encouraged Educated on diet, medications Assessed patient finances. Will enroll in az&me patient assistance for breztri inhaler  Hyperlipidemia:  Goal on Track (progressing): YES. Controlled; current treatment:atorvastatin;  Medications previously tried: n/a  Current dietary patterns: ENCOURAGED HEART HEALTHY to decrease  triglycerides/cholesterol Educated on DIET/EXERICISE  ASTHMA -symbicort-->Breztri (plus has nebs on board when unable to use inhalers)--enrolled in az&me patient assistance program; RE-ENROLLMENT SENT FOR 2023 TO [facility]ME -Uses rescue albuterol occasionally -Uses CPAP at night -continue current management per pulm  Patient Goals/Self-Care Activities patient will:  - take medications as prescribed as evidenced by patient report and record review check glucose DAILY/FASTING, document, and provide at future appointments collaborate with provider on medication access solutions target a minimum of 150 minutes of moderate intensity exercise weekly engage in dietary modifications by FOLLOWING HEART HEALTHY DIET   Plan: Telephone follow up appointment with care management team member scheduled for:  3 months  Signature Regina Eck, PharmD, BCPS Clinical Pharmacist, [place]  II Phone [phone]   Please call the care guide team at [phone] if you need to cancel or reschedule your appointment.   The patient verbalized understanding of instructions, educational materials, and care plan provided today and DECLINED offer to receive copy of patient instructions, educational materials, and care plan.

## 2021-10-21 ENCOUNTER — Ambulatory Visit (HOSPITAL_COMMUNITY)
Admission: RE | Admit: 2021-10-21 | Discharge: 2021-10-21 | Disposition: A | Discharge status: Home / Self Care | Payer: Medicare HMO | Source: Ambulatory Visit | Attending: Neurosurgery | Admitting: Neurosurgery

## 2021-10-21 ENCOUNTER — Other Ambulatory Visit: Payer: Self-pay | Admitting: Neurosurgery

## 2021-10-21 ENCOUNTER — Other Ambulatory Visit: Payer: Self-pay

## 2021-10-21 DIAGNOSIS — I671 Cerebral aneurysm, nonruptured: Secondary | ICD-10-CM | POA: Diagnosis not present

## 2021-10-21 DIAGNOSIS — J45909 Unspecified asthma, uncomplicated: Secondary | ICD-10-CM

## 2021-10-21 DIAGNOSIS — I10 Essential (primary) hypertension: Secondary | ICD-10-CM | POA: Insufficient documentation

## 2021-10-21 DIAGNOSIS — E119 Type 2 diabetes mellitus without complications: Secondary | ICD-10-CM | POA: Insufficient documentation

## 2021-10-21 DIAGNOSIS — E1159 Type 2 diabetes mellitus with other circulatory complications: Secondary | ICD-10-CM

## 2021-10-21 DIAGNOSIS — E785 Hyperlipidemia, unspecified: Secondary | ICD-10-CM

## 2021-10-21 HISTORY — PX: IR ANGIO INTRA EXTRACRAN SEL COM CAROTID INNOMINATE UNI R MOD SED: IMG5359

## 2021-10-21 HISTORY — PX: IR ANGIO VERTEBRAL SEL VERTEBRAL UNI L MOD SED: IMG5367

## 2021-10-21 HISTORY — PX: IR ANGIO INTRA EXTRACRAN SEL INTERNAL CAROTID UNI L MOD SED: IMG5361

## 2021-10-21 LAB — BASIC METABOLIC PANEL
Anion gap: 7 (ref 5–15)
BUN: 24 mg/dL — ABNORMAL HIGH (ref 8–23)
CO2: 22 mmol/L (ref 22–32)
Calcium: 9.1 mg/dL (ref 8.9–10.3)
Chloride: 112 mmol/L — ABNORMAL HIGH (ref 98–111)
Creatinine, Ser: 1.33 mg/dL — ABNORMAL HIGH (ref 0.44–1.00)
GFR, Estimated: 43 mL/min — ABNORMAL LOW (ref >60)
Glucose, Bld: 96 mg/dL (ref 70–99)
Potassium: 4.6 mmol/L (ref 3.5–5.1)
Sodium: 141 mmol/L (ref 135–145)

## 2021-10-21 LAB — CBC WITH DIFFERENTIAL/PLATELET
Abs Immature Granulocytes: 0.01 10*3/uL (ref 0.00–0.07)
Basophils Absolute: 0.1 10*3/uL (ref 0.0–0.1)
Basophils Relative: 1 %
Eosinophils Absolute: 0.3 10*3/uL (ref 0.0–0.5)
Eosinophils Relative: 5 %
HCT: 37 % (ref 36.0–46.0)
Hemoglobin: 11.9 g/dL — ABNORMAL LOW (ref 12.0–15.0)
Immature Granulocytes: 0 %
Lymphocytes Relative: 30 %
Lymphs Abs: 1.9 10*3/uL (ref 0.7–4.0)
MCH: 31.9 pg (ref 26.0–34.0)
MCHC: 32.2 g/dL (ref 30.0–36.0)
MCV: 99.2 fL (ref 80.0–100.0)
Monocytes Absolute: 0.6 10*3/uL (ref 0.1–1.0)
Monocytes Relative: 9 %
Neutro Abs: 3.6 10*3/uL (ref 1.7–7.7)
Neutrophils Relative %: 55 %
Platelets: 252 10*3/uL (ref 150–400)
RBC: 3.73 MIL/uL — ABNORMAL LOW (ref 3.87–5.11)
RDW: 13.7 % (ref 11.5–15.5)
WBC: 6.5 10*3/uL (ref 4.0–10.5)
nRBC: 0 % (ref 0.0–0.2)

## 2021-10-21 LAB — GLUCOSE, CAPILLARY: Glucose-Capillary: 103 mg/dL — ABNORMAL HIGH (ref 70–99)

## 2021-10-21 LAB — PROTIME-INR
INR: 1 (ref 0.8–1.2)
Prothrombin Time: 13 seconds (ref 11.4–15.2)

## 2021-10-21 MED ORDER — MIDAZOLAM HCL 2 MG/2ML IJ SOLN
INTRAMUSCULAR | Status: AC
  Filled 2021-10-21: qty 2

## 2021-10-21 MED ORDER — IOHEXOL 300 MG/ML  SOLN
100.0000 mL | Freq: Once | INTRAMUSCULAR | Status: AC | PRN
  Administered 2021-10-21: 20 mL via INTRA_ARTERIAL

## 2021-10-21 MED ORDER — HEPARIN SODIUM (PORCINE) 1000 UNIT/ML IJ SOLN
INTRAMUSCULAR | Status: AC | PRN
  Administered 2021-10-21: 2000 [IU] via INTRAVENOUS

## 2021-10-21 MED ORDER — FENTANYL CITRATE (PF) 100 MCG/2ML IJ SOLN
INTRAMUSCULAR | Status: AC | PRN
  Administered 2021-10-21: 25 ug via INTRAVENOUS

## 2021-10-21 MED ORDER — HEPARIN SODIUM (PORCINE) 1000 UNIT/ML IJ SOLN
INTRAMUSCULAR | Status: AC
  Filled 2021-10-21: qty 10

## 2021-10-21 MED ORDER — FENTANYL CITRATE (PF) 100 MCG/2ML IJ SOLN
INTRAMUSCULAR | Status: AC
  Filled 2021-10-21: qty 2

## 2021-10-21 MED ORDER — LIDOCAINE HCL 1 % IJ SOLN
INTRAMUSCULAR | Status: AC
  Filled 2021-10-21: qty 20

## 2021-10-21 MED ORDER — MIDAZOLAM HCL 2 MG/2ML IJ SOLN
INTRAMUSCULAR | Status: AC | PRN
  Administered 2021-10-21: 1 mg via INTRAVENOUS

## 2021-10-21 MED ORDER — IOHEXOL 300 MG/ML  SOLN
100.0000 mL | Freq: Once | INTRAMUSCULAR | Status: AC | PRN
  Administered 2021-10-21: 50 mL via INTRA_ARTERIAL

## 2021-10-21 NOTE — Brief Op Note (Signed)
  NEUROSURGERY BRIEF OPERATIVE  NOTE   PREOP DX: Aneurysm  POSTOP DX: Same  PROCEDURE: Diagnostic cerebral angiogram  SURGEON: Dr. Consuella Lose, MD  ANESTHESIA: IV Sedation with Local  APPROACH: Right trans-femoral  EBL: Minimal  SPECIMENS: None  COMPLICATIONS: None  CONDITION: Stable to recovery  FINDINGS (Full report in [facility]): 1. Approx 79m right A3-A4 junction aneurysm   NConsuella Lose MD [facility]

## 2021-10-21 NOTE — Discharge Instructions (Signed)
Femoral Site Care This sheet gives you information about how to care for yourself after your procedure. Your health care provider may also give you more specific instructions. If you have problems or questions, contact your health care provider. What can I expect after the procedure?  After the procedure, it is common to have: Bruising that usually fades within 1-2 weeks. Tenderness at the site. Follow these instructions at home: Wound care Follow instructions from your health care provider about how to take care of your insertion site. Make sure you: Wash your hands with soap and water before you change your bandage (dressing). If soap and water are not available, use hand sanitizer. Remove your dressing as told by your health care provider. 24 hours Do not take baths, swim, or use a hot tub until your health care provider approves. You may shower 24-48 hours after the procedure or as told by your health care provider. Gently wash the site with plain soap and water. Pat the area dry with a clean towel. Do not rub the site. This may cause bleeding. Do not apply powder or lotion to the site. Keep the site clean and dry. Check your femoral site every day for signs of infection. Check for: Redness, swelling, or pain. Fluid or blood. Warmth. Pus or a bad smell. Activity For the first 2-3 days after your procedure, or as long as directed: Avoid climbing stairs as much as possible. Do not squat. Do not lift anything that is heavier than 10 lb (4.5 kg), or the limit that you are told, until your health care provider says that it is safe. For 5 days Rest as directed. Avoid sitting for a long time without moving. Get up to take short walks every 1-2 hours. Do not drive for 24 hours if you were given a medicine to help you relax (sedative). General instructions Take over-the-counter and prescription medicines only as told by your health care provider. Keep all follow-up visits as told by your  health care provider. This is important. Contact a health care provider if you have: A fever or chills. You have redness, swelling, or pain around your insertion site. Get help right away if: The catheter insertion area swells very fast. You pass out. You suddenly start to sweat or your skin gets clammy. The catheter insertion area is bleeding, and the bleeding does not stop when you hold steady pressure on the area. The area near or just beyond the catheter insertion site becomes pale, cool, tingly, or numb. These symptoms may represent a serious problem that is an emergency. Do not wait to see if the symptoms will go away. Get medical help right away. Call your local emergency services (911 in the [place] Do not drive yourself to the hospital. Summary After the procedure, it is common to have bruising that usually fades within 1-2 weeks. Check your femoral site every day for signs of infection. Do not lift anything that is heavier than 10 lb (4.5 kg), or the limit that you are told, until your health care provider says that it is safe. This information is not intended to replace advice given to you by your health care provider. Make sure you discuss any questions you have with your health care provider. Document Revised: 02/20/2017 Document Reviewed: 02/20/2017 Elsevier Patient Education  [place].'

## 2021-10-21 NOTE — H&P (Signed)
Chief Complaint   Aneurysm  History of Present Illness  Amber Reeves is a 71 y.o. female 71 year old woman seen for incidental discovery of intracranial aneurysm.  Briefly, the patient has been complaining of dizziness with associated ringing in the ears and hearing loss on the right side.  She was seen by ENT where MRI was done to evaluate the possibility of tumor.  While now tumor was identified, there was suggestion of possible intracranial aneurysm.  She was therefore referred for neurosurgical evaluation.  Of note, the patient does have a history of medically controlled hypertension and diabetes.  She does not have any known kidney disease.  There is no family history of intracranial aneurysms.  She is a non-smoker.  Past Medical History   Past Medical History:  Diagnosis Date   Allergy    Anxiety    Arthritis    Asthma    Depression    Diabetes mellitus without complication ([place])    GERD (gastroesophageal reflux disease)    Hyperlipidemia    Hypertension     Past Surgical History   Past Surgical History:  Procedure Laterality Date   ABDOMINAL HYSTERECTOMY     BREAST EXCISIONAL BIOPSY     CESAREAN SECTION     3   CHOLECYSTECTOMY  2018   CORONARY STENT PLACEMENT  10/2017   [facility] hospital)   LEFT HEART CATH AND CORONARY ANGIOGRAPHY N/A 02/25/2019   Procedure: LEFT HEART CATH AND CORONARY ANGIOGRAPHY;  Surgeon: Nelva Bush, MD;  Location: [facility];  Service: Cardiovascular;  Laterality: N/A;   REPLACEMENT TOTAL KNEE BILATERAL Bilateral    done in [place] (2003/2005)   RIGHT/LEFT HEART CATH AND CORONARY ANGIOGRAPHY N/A 09/22/2020   Procedure: RIGHT/LEFT HEART CATH AND CORONARY ANGIOGRAPHY;  Surgeon: Leonie Man, MD;  Location: [facility];  Service: Cardiovascular;  Laterality: N/A;    Social History   Social History   Tobacco Use   Smoking status: Never   Smokeless tobacco: Never  Vaping Use   Vaping Use: Never used   Substance Use Topics   Alcohol use: Never   Drug use: Never    Medications   Prior to Admission medications   Medication Sig Start Date End Date Taking? Authorizing Provider  albuterol (VENTOLIN HFA) 108 (90 Base) MCG/ACT inhaler TAKE 2 PUFFS BY MOUTH EVERY 4 HOURS AS NEEDED FOR WHEEZE 09/16/21  Yes Gottschalk, Ashly M, DO  Alcohol Swabs (B-D SINGLE USE SWABS REGULAR) PADS Apply topically. 07/29/15  Yes [provider]  amLODipine (NORVASC) 10 MG tablet Take 1 tablet (10 mg total) by mouth daily. 03/15/21 09/16/78 Yes BranchAlphonse Guild, MD  aspirin EC 81 MG tablet Take 81 mg by mouth in the morning.   Yes [provider]  atorvastatin (LIPITOR) 40 MG tablet Take 1 tablet (40 mg total) by mouth at bedtime. 09/30/20  Yes Gottschalk, Leatrice Jewels M, DO  Blood Glucose Calibration (ACCU-CHEK AVIVA) SOLN 2 Bottles by Other route as needed. 07/29/15  Yes [provider]  Budeson-Glycopyrrol-Formoterol (BREZTRI AEROSPHERE) 160-9-4.8 MCG/ACT AERO Inhale 2 puffs into the lungs 2 (two) times daily. 09/22/21  Yes Gottschalk, Ashly M, DO  budesonide (PULMICORT) 0.25 MG/2ML nebulizer solution Take 2 mLs (0.25 mg total) by nebulization 2 (two) times daily. 08/05/21  Yes Chesley Mires, MD  cetirizine (ZYRTEC ALLERGY) 10 MG tablet Take 1 tablet (10 mg total) by mouth daily. 06/24/21  Yes Hawks, Christy A, FNP  diphenhydrAMINE (BENADRYL) 25 MG tablet Take 50  mg by mouth at bedtime.   Yes [provider]  fluticasone (FLONASE) 50 MCG/ACT nasal spray Place 2 sprays into both nostrils daily. 06/24/21  Yes Hawks, Christy A, FNP  furosemide (LASIX) 20 MG tablet Take 1 tablet (20 mg total) by mouth as needed (based on weight - may take one extra tab for weight gain of 3lb in 24 hrs or 5lb in 1 week). Patient taking differently: Take 20 mg by mouth daily as needed (based on weight - may take one extra tab for weight gain of 3lb in 24 hrs or 5lb in 1 week). 10/06/20  Yes Verta Ellen., NP  glucose  blood test strip TEST BLOOD SUGAR TWICE DAILY 10/03/18  Yes [provider]  ibuprofen (ADVIL) 200 MG tablet Take 400 mg by mouth daily as needed (pain (leg pain)).   Yes [provider]  insulin degludec (TRESIBA FLEXTOUCH) 200 UNIT/ML FlexTouch Pen Inject 40 Units into the skin daily. Patient taking differently: Inject 38 Units into the skin in the morning. 04/24/20  Yes Ronnie Doss M, DO  Insulin Syringe-Needle U-100 31G X 5/16" 0.5 ML MISC Use to inject Insulin four times daily: Use as directed; Dx: E11.9, E66.9 10/03/18  Yes [provider]  ipratropium-albuterol (DUONEB) 0.5-2.5 (3) MG/3ML SOLN TAKE 3 ML (1 VIAL) BY NEBULIZATION EVERY 6 HOURS AS NEEDED FOR WHEEZING. Patient taking differently: Take 3 mLs by nebulization every 6 (six) hours as needed (wheezing). 02/16/21  Yes Ronnie Doss M, DO  isosorbide mononitrate (IMDUR) 30 MG 24 hr tablet Take 1 tablet (30 mg total) by mouth 2 (two) times daily. 09/15/21  Yes BranchAlphonse Guild, MD  lansoprazole (PREVACID) 15 MG capsule Take 30 mg by mouth at bedtime.   Yes [provider]  lisinopril (ZESTRIL) 20 MG tablet Take 1 tablet (20 mg total) by mouth daily. 09/15/21  Yes BranchAlphonse Guild, MD  magnesium oxide (MAG-OX) 400 MG tablet Take magnesium on days you take lasix 09/30/20  Yes Gottschalk, Ashly M, DO  methocarbamol (ROBAXIN) 500 MG tablet TAKE 0.5-1 TABLETS (250-500 MG TOTAL) 2 (TWO) TIMES DAILY AS NEEDED FOR MUSCLE SPASMS (NECK PAIN). 08/02/21  Yes Ronnie Doss M, DO  metoprolol tartrate (LOPRESSOR) 25 MG tablet Take 1 tablet (25 mg total) by mouth 2 (two) times daily. 06/03/21  Yes Gottschalk, Ashly M, DO  montelukast (SINGULAIR) 10 MG tablet Take 1 tablet (10 mg total) by mouth at bedtime. 01/08/21  Yes Gottschalk, Ashly M, DO  pioglitazone (ACTOS) 30 MG tablet TAKE 1 TABLET BY MOUTH EVERY DAY 10/08/21  Yes Gottschalk, Ashly M, DO  potassium chloride (KLOR-CON M10) 10 MEQ tablet TAKE 1 TABLET (10  MEQ TOTAL) BY MOUTH EVERY EVENING. 10/12/21  Yes Gottschalk, Ashly M, DO  arformoterol (BROVANA) 15 MCG/2ML NEBU Take 2 mLs (15 mcg total) by nebulization 2 (two) times daily. 08/05/21   Chesley Mires, MD  Calcium Carb-Cholecalciferol (CALCIUM 600+D3 PO) Take 1 tablet by mouth in the morning.    [provider]  nitroGLYCERIN (NITROSTAT) 0.4 MG SL tablet Place 1 tablet (0.4 mg total) under the tongue every 5 (five) minutes x 3 doses as needed for chest pain (if no relief after 2nd dose, proceed to ED or call 911). 09/15/21   Arnoldo Lenis, MD    Allergies   Allergies  Allergen Reactions   Aspartame Rash   Penicillins Itching and Rash    Did it involve swelling of the face/tongue/throat, SOB, or low BP? No Did  it involve sudden or severe rash/hives, skin peeling, or any reaction on the inside of your mouth or nose? No Did you need to seek medical attention at a hospital or doctor's office? No When did it last happen?~25 years ago       If all above answers are "NO", may proceed with cephalosporin use.    Wilder Glade [Dapagliflozin]     UTIs   Metformin And Related     GI distress, dizziness   No [employer] Food Allergies     Honey-Rash Artificial Sweeteners-rash   Paxil [Paroxetine Hcl] Other (See Comments)    Twitching/feels like skin crawling   Prozac [Fluoxetine Hcl] Other (See Comments)    Twitching/feels like skin crawling   Zoloft [Sertraline Hcl] Other (See Comments)    Twitching/feels like skin crawling   Prednisone Palpitations   Ranexa [Ranolazine] Palpitations    Heart racing    Review of Systems  ROS  Neurologic Exam  Awake, alert, oriented Memory and concentration grossly intact Speech fluent, appropriate CN grossly intact Motor exam: Upper Extremities Deltoid Bicep Tricep Grip  Right 5/5 5/5 5/5 5/5  Left 5/5 5/5 5/5 5/5   Lower Extremities IP Quad PF DF EHL  Right 5/5 5/5 5/5 5/5 5/5  Left 5/5 5/5 5/5 5/5 5/5   Sensation grossly intact to  LT  Impression  - 71 y.o. 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  We have discussed the associated risks, benefits, and alternatives to the procedure.  All her questions today were answered and she provided informed consent to proceed.   Consuella Lose, MD [facility]

## 2021-10-21 NOTE — Sedation Documentation (Signed)
97f exoseal deployed to right femoral artery by IR Tech. Manual pressure being held at site.

## 2021-10-29 ENCOUNTER — Telehealth: Payer: Self-pay

## 2021-10-29 NOTE — Chronic Care Management (AMB) (Signed)
  Chronic Care Management   Note  10/29/2021 Name: Amber Reeves MRN: [id number] DOB: 03/13/1950  Amber Reeves is a 71 y.o. year old female who is a primary care patient of Janora Norlander, DO. I reached out to Celesta Aver by phone today in response to a referral sent by Amber Reeves's PCP.  Amber Reeves was given information about Chronic Care Management services today including:  CCM service includes personalized support from designated clinical staff supervised by her physician, including individualized plan of care and coordination with other care providers 24/7 contact phone numbers for assistance for urgent and routine care needs. Service will only be billed when office clinical staff spend 20 minutes or more in a month to coordinate care. Only one practitioner may furnish and bill the service in a calendar month. The patient may stop CCM services at any time (effective at the end of the month) by phone call to the office staff. The patient is responsible for co-pay (up to 20% after annual deductible is met) if co-pay is required by the individual health plan.   Patient agreed to services and verbal consent obtained.   Follow up plan: Telephone appointment with care management team member scheduled for:01/28/2022  Noreene Larsson, [place], [place] [address] Direct Dial: [phone] [email or web address]

## 2021-11-01 DIAGNOSIS — I671 Cerebral aneurysm, nonruptured: Secondary | ICD-10-CM | POA: Diagnosis not present

## 2021-11-04 ENCOUNTER — Other Ambulatory Visit (HOSPITAL_COMMUNITY): Payer: Self-pay | Admitting: Neurosurgery

## 2021-11-04 DIAGNOSIS — I671 Cerebral aneurysm, nonruptured: Secondary | ICD-10-CM

## 2021-11-09 ENCOUNTER — Other Ambulatory Visit: Payer: Self-pay | Admitting: Neurosurgery

## 2021-11-11 ENCOUNTER — Encounter: Payer: Self-pay | Admitting: Cardiology

## 2021-11-11 ENCOUNTER — Ambulatory Visit: Payer: Medicare HMO | Attending: Cardiology | Admitting: Cardiology

## 2021-11-11 VITALS — BP 122/70 | HR 54 | Ht 63.0 in | Wt 238.6 lb

## 2021-11-11 DIAGNOSIS — I5032 Chronic diastolic (congestive) heart failure: Secondary | ICD-10-CM | POA: Diagnosis not present

## 2021-11-11 DIAGNOSIS — I10 Essential (primary) hypertension: Secondary | ICD-10-CM

## 2021-11-11 DIAGNOSIS — I25119 Atherosclerotic heart disease of native coronary artery with unspecified angina pectoris: Secondary | ICD-10-CM

## 2021-11-11 NOTE — Patient Instructions (Addendum)
Medication Instructions:  Your physician recommends that you continue on your current medications as directed. Please refer to the Current Medication list given to you today.  -HOLD IMDUR UNTIL MONDAY: CALL us WITH UPDATE ON HEADACHES.   Labwork: None  Testing/Procedures: None  Follow-Up: Follow up with Dr. Harl Bowie in 6 months.   Any Other Special Instructions Will Be Listed Below (If Applicable).     If you need a refill on your cardiac medications before your next appointment, please call your pharmacy.

## 2021-11-11 NOTE — Progress Notes (Signed)
Clinical Summary Ms. Taher is a 71 y.o.female seen today for follow up of the following medical problems.    1. CAD -11/2017 cath at [facility] showed >70% mid LAD, otherwise patent vessels. She presented with chest pain, not an MI - received DES to LAD - 11/2017 echo  LVEF Complete echocardiogram shows a left ventricular EF by visual approximation 60 to 65%. Normal left ventricular systolic function. Grade 1 left ventricular diastolic function. No significant valvular disease. No pericardial effusion   01/2018 stress test: THR not achieved, exercise limiting angina , duke treadmill score neg 1   - she reports prior false negative stress tests in the past.  - 09/04/18 nuclear stress by prior cardiollogist showed anterior breast artifact, probably normal,     Jan 2021 cath: mild nonobstructive disease.    -06/2020 echo LVEF 65-70%, grade II dd, - lasix changed to 15m daily   09/2020 RHC/LHC: LM patent, patent LAD stent, ramus patent, LCX patent, RCA 20%. Several tiny diagonals are jailed by stent, to small for intervetion Mean PA 21 PCWP 16 CI 4.08 LVEDP 18     -recent symptoms started Sunday. Could be sharp or nagging, midchest. At rest and with exertion. 2-3/10 in severity. Some associated SOB. Worst with deep breathing. Lasted 5-10 minutes, went ways on its own.  "Weird feeling" during that episode, felt like she may pass out. Felt lightheaded. No specific palpitations. Sat down and started to feel better.  - had some cough, slight wheeze. Nonprodcutive. No fevers or chills. Had been using nebulzier/inhaler leading up to episode some improvement. Overall increased use nebulizer   -on Monday recurrnt pain. Tootache like pain midchest, 2/10 in severity. Some improvement in symptoms after 20 minutes. Pain lasted few minutes, off and on throught the day. Somewhat different from prior chest pains.  Tuesday felt well overall.  - we increased norvasc to 145mdaily   - chest pains  chronic, overall stable. Often assocaited stress - compliant with meds     2.SOB - compliant inhalers and nebs, can improve with nebs - some recent cough, wheezing.  - some increased LE edema, weight is up 14 lbs since Jan      2. HTN - renal artery angio at time of 11/2017 cath showed no significant renal artery disease   - we lowered lisinopril to 2078maily and isosorbide to 51m70md due to dizziness last visit     3. Hyperlipidemia - she is on a statin -01/2021 LDL 80   4. OSA on cpap - she attributes getting pneumonia when on cpap, she stopped wearing. - does not want to wear cpap       5. Chronic diastolic HF -some LE edema at times.  - ran out of her lasix.      6. Dizziness - ER visit 07/2021  - from EMS notes bps 80s to 90s with standing.  - improved with IVFs   - dizziness can happen with laying flat, sitting or standing.  - recent inner ear issues, followed by ENT - bps were low 104/64 - takes lasix prn, has been taking daily recently.  - 1 liter of water daily, 3-4 can pepsi daily.   - dizzines improved with med changes  8.Cerebral aneurysm - followed by neurosurg - upcoming coiling procedure early Oct   9. Arrhythmia - 08/2021 monitor one 4 beat run NSVT, 31 runs SVT longest 15 seconds.  - no recent symptoms -had been on prednisone  around this time.  Past Medical History:  Diagnosis Date   Allergy    Anxiety    Arthritis    Asthma    Depression    Diabetes mellitus without complication (HCC)    GERD (gastroesophageal reflux disease)    Hyperlipidemia    Hypertension      Allergies  Allergen Reactions   Aspartame Rash   Penicillins Itching and Rash    Did it involve swelling of the face/tongue/throat, SOB, or low BP? No Did it involve sudden or severe rash/hives, skin peeling, or any reaction on the inside of your mouth or nose? No Did you need to seek medical attention at a hospital or doctor's office? No When did it last happen?~25  years ago       If all above answers are "NO", may proceed with cephalosporin use.    Wilder Glade [Dapagliflozin]     UTIs   Metformin And Related     GI distress, dizziness   No [facility]alt[facility] Food Allergies     Honey-Rash Artificial Sweeteners-rash   Paxil [Paroxetine Hcl] Other (See Comments)    Twitching/feels like skin crawling   Prozac [Fluoxetine Hcl] Other (See Comments)    Twitching/feels like skin crawling   Zoloft [Sertraline Hcl] Other (See Comments)    Twitching/feels like skin crawling   Prednisone Palpitations   Ranexa [Ranolazine] Palpitations    Heart racing     Current Outpatient Medications  Medication Sig Dispense Refill   albuterol (VENTOLIN HFA) 108 (90 Base) MCG/ACT inhaler TAKE 2 PUFFS BY MOUTH EVERY 4 HOURS AS NEEDED FOR WHEEZE 18 g 3   Alcohol Swabs (B-D SINGLE USE SWABS REGULAR) PADS Apply topically.     amLODipine (NORVASC) 10 MG tablet Take 1 tablet (10 mg total) by mouth daily. 90 tablet 3   arformoterol (BROVANA) 15 MCG/2ML NEBU Take 2 mLs (15 mcg total) by nebulization 2 (two) times daily. 120 mL 5   aspirin EC 81 MG tablet Take 81 mg by mouth in the morning.     atorvastatin (LIPITOR) 40 MG tablet Take 1 tablet (40 mg total) by mouth at bedtime. 90 tablet 3   Blood Glucose Calibration (ACCU-CHEK AVIVA) SOLN 2 Bottles by Other route as needed.     Budeson-Glycopyrrol-Formoterol (BREZTRI AEROSPHERE) 160-9-4.8 MCG/ACT AERO Inhale 2 puffs into the lungs 2 (two) times daily. 32.1 g 11   budesonide (PULMICORT) 0.25 MG/2ML nebulizer solution Take 2 mLs (0.25 mg total) by nebulization 2 (two) times daily. 120 mL 5   Calcium Carb-Cholecalciferol (CALCIUM 600+D3 PO) Take 1 tablet by mouth in the morning.     cetirizine (ZYRTEC ALLERGY) 10 MG tablet Take 1 tablet (10 mg total) by mouth daily. 90 tablet 1   diphenhydrAMINE (BENADRYL) 25 MG tablet Take 50 mg by mouth at bedtime.     fluticasone (FLONASE) 50 MCG/ACT nasal spray Place 2 sprays into both nostrils daily.  16 g 6   furosemide (LASIX) 20 MG tablet Take 1 tablet (20 mg total) by mouth as needed (based on weight - may take one extra tab for weight gain of 3lb in 24 hrs or 5lb in 1 week). (Patient taking differently: Take 20 mg by mouth daily as needed (based on weight - may take one extra tab for weight gain of 3lb in 24 hrs or 5lb in 1 week).)     glucose blood test strip TEST BLOOD SUGAR TWICE DAILY     ibuprofen (ADVIL) 200 MG tablet Take 400 mg by  mouth daily as needed (pain (leg pain)).     insulin degludec (TRESIBA FLEXTOUCH) 200 UNIT/ML FlexTouch Pen Inject 40 Units into the skin daily. (Patient taking differently: Inject 38 Units into the skin in the morning.) 18 mL 0   Insulin Syringe-Needle U-100 31G X 5/16" 0.5 ML MISC Use to inject Insulin four times daily: Use as directed; Dx: E11.9, E66.9     ipratropium-albuterol (DUONEB) 0.5-2.5 (3) MG/3ML SOLN TAKE 3 ML (1 VIAL) BY NEBULIZATION EVERY 6 HOURS AS NEEDED FOR WHEEZING. (Patient taking differently: Take 3 mLs by nebulization every 6 (six) hours as needed (wheezing).) 360 mL 0   isosorbide mononitrate (IMDUR) 30 MG 24 hr tablet Take 1 tablet (30 mg total) by mouth 2 (two) times daily. 180 tablet 0   lansoprazole (PREVACID) 15 MG capsule Take 30 mg by mouth at bedtime.     lisinopril (ZESTRIL) 20 MG tablet Take 1 tablet (20 mg total) by mouth daily. 90 tablet 0   magnesium oxide (MAG-OX) 400 MG tablet Take magnesium on days you take lasix 60 tablet 3   methocarbamol (ROBAXIN) 500 MG tablet TAKE 0.5-1 TABLETS (250-500 MG TOTAL) 2 (TWO) TIMES DAILY AS NEEDED FOR MUSCLE SPASMS (NECK PAIN). 30 tablet 1   metoprolol tartrate (LOPRESSOR) 25 MG tablet Take 1 tablet (25 mg total) by mouth 2 (two) times daily. 180 tablet 1   montelukast (SINGULAIR) 10 MG tablet Take 1 tablet (10 mg total) by mouth at bedtime. 90 tablet 3   nitroGLYCERIN (NITROSTAT) 0.4 MG SL tablet Place 1 tablet (0.4 mg total) under the tongue every 5 (five) minutes x 3 doses as needed  for chest pain (if no relief after 2nd dose, proceed to ED or call 911). 25 tablet 3   pioglitazone (ACTOS) 30 MG tablet TAKE 1 TABLET BY MOUTH EVERY DAY 90 tablet 0   potassium chloride (KLOR-CON M10) 10 MEQ tablet TAKE 1 TABLET (10 MEQ TOTAL) BY MOUTH EVERY EVENING. 90 tablet 1   Current Facility-Administered Medications  Medication Dose Route Frequency Provider Last Rate Last Admin   sodium chloride flush (NS) 0.9 % injection 3 mL  3 mL Intravenous Q12H , Alphonse Guild, MD         Past Surgical History:  Procedure Laterality Date   ABDOMINAL HYSTERECTOMY     BREAST EXCISIONAL BIOPSY     CESAREAN SECTION     3   CHOLECYSTECTOMY  2018   CORONARY STENT PLACEMENT  [facility]   IR ANGIO INTRA EXTRACRAN SEL COM CAROTID INNOMINATE UNI R MOD SED  10/21/2021   IR ANGIO INTRA EXTRACRAN SEL INTERNAL CAROTID UNI L MOD SED  10/21/2021   IR ANGIO VERTEBRAL SEL VERTEBRAL UNI L MOD SED  10/21/2021   LEFT HEART CATH AND CORONARY ANGIOGRAPHY N/A 02/25/2019   Procedure: LEFT HEART CATH AND CORONARY ANGIOGRAPHY;  Surgeon: Nelva Bush, MD;  Location: [facility];  Service: Cardiovascular;  Laterality: N/A;   REPLACEMENT TOTAL KNEE BILATERAL Bilateral    done in [place] (2003/2005)   RIGHT/LEFT HEART CATH AND CORONARY ANGIOGRAPHY N/A 09/22/2020   Procedure: RIGHT/LEFT HEART CATH AND CORONARY ANGIOGRAPHY;  Surgeon: Leonie Man, MD;  Location: [facility];  Service: Cardiovascular;  Laterality: N/A;     Allergies  Allergen Reactions   Aspartame Rash   Penicillins Itching and Rash    Did it involve swelling of the face/tongue/throat, SOB, or low BP? No Did it involve sudden or severe rash/hives,  skin peeling, or any reaction on the inside of your mouth or nose? No Did you need to seek medical attention at a hospital or doctor's office? No When did it last happen?~25 years ago       If all above answers are "NO", may proceed with cephalosporin  use.    Wilder Glade [Dapagliflozin]     UTIs   Metformin And Related     GI distress, dizziness   No [facility]alt[facility] Food Allergies     Honey-Rash Artificial Sweeteners-rash   Paxil [Paroxetine Hcl] Other (See Comments)    Twitching/feels like skin crawling   Prozac [Fluoxetine Hcl] Other (See Comments)    Twitching/feels like skin crawling   Zoloft [Sertraline Hcl] Other (See Comments)    Twitching/feels like skin crawling   Prednisone Palpitations   Ranexa [Ranolazine] Palpitations    Heart racing      Family History  Problem Relation Age of Onset   Heart disease Mother    Stroke Mother    Hypertension Mother    Cancer Father    Stroke Father      Social History Ms. Zynda reports that she has never smoked. She has never used smokeless tobacco. Ms. Duck reports no history of alcohol use.   Review of Systems CONSTITUTIONAL: No weight loss, fever, chills, weakness or fatigue.  HEENT: Eyes: No visual loss, blurred vision, double vision or yellow sclerae.No hearing loss, sneezing, congestion, runny nose or sore throat.  SKIN: No rash or itching.  CARDIOVASCULAR: per hpi RESPIRATORY: No shortness of breath, cough or sputum.  GASTROINTESTINAL: No anorexia, nausea, vomiting or diarrhea. No abdominal pain or blood.  GENITOURINARY: No burning on urination, no polyuria NEUROLOGICAL: No headache, dizziness, syncope, paralysis, ataxia, numbness or tingling in the extremities. No change in bowel or bladder control.  MUSCULOSKELETAL: No muscle, back pain, joint pain or stiffness.  LYMPHATICS: No enlarged nodes. No history of splenectomy.  PSYCHIATRIC: No history of depression or anxiety.  ENDOCRINOLOGIC: No reports of sweating, cold or heat intolerance. No polyuria or polydipsia.  Marland Kitchen   Physical Examination Today's Vitals   11/11/21 1032  BP: 122/70  Pulse: (!) 54  SpO2: 97%  Weight: 238 lb 9.6 oz (108.2 kg)  Height: 5' 3"  (1.6 m)   Body mass index is 42.27 kg/m.  Gen:  resting comfortably, no acute distress HEENT: no scleral icterus, pupils equal round and reactive, no palptable cervical adenopathy,  CV: RRR, no m/r/ gno jvd Resp: Clear to auscultation bilaterally GI: abdomen is soft, non-tender, non-distended, normal bowel sounds, no hepatosplenomegaly MSK: extremities are warm, no edema.  Skin: warm, no rash Neuro:  no focal deficits Psych: appropriate affect   Diagnostic Studies  11/2017 cath [facility] Severe >70% long mid LAD stenosis No other significant angiographic stenosis in the other coronary arteries  DES to mid LAD   Malignant hypertension - selective renal angio without any evidence of  renal artery stenosis.   Renal angio procedure:  A Tiger 4 catheter was used to selectively engage both the left and right  renal arteries and was used to perform selective angiography.    Renal artery anatomy:  The left kidney is supplied by a single renal artery which has no  angiographic stenosis  The right kidney is supplied by a single rena artery which has no  angiographic stenosis     Jan 2021 Mild, non-obstructive coronary artery disease. Widely patent proximal/mid LAD stent. Normal left ventricular systolic function with upper normal to mildly elevated  filling pressure (LVEDP ~15 mmHg).   06/2020 echo  1. Left ventricular ejection fraction, by estimation, is 65 to 70%. The  left ventricle has normal function. Left ventricular endocardial border  not optimally defined to evaluate regional wall motion. There is mild left  ventricular hypertrophy. Left  ventricular diastolic parameters are consistent with Grade II diastolic  dysfunction (pseudonormalization). Normal global longitudinal strain of  -19.8%.   2. Right ventricular systolic function is normal. The right ventricular  size is normal. There is normal pulmonary artery systolic pressure. The  estimated right ventricular systolic pressure is 27.6 mmHg.   3. Left atrial  size was mildly dilated.   4. Right atrial size was mildly dilated.   5. The mitral valve is grossly normal. Trivial mitral valve  regurgitation.   6. The aortic valve has an indeterminant number of cusps. Aortic valve  regurgitation is not visualized.   7. The inferior vena cava is normal in size with greater than 50%  respiratory variability, suggesting right atrial pressure of 3 mmHg.     09/2020 RHC/LHC: LM patent, patent LAD stent, ramus patent, LCX patent, RCA 20% Mean PA 21 PCWP 16 CI 4.08    08/2021 heart monitor Patch Wear Time:  9 days and 17 hours (2023-07-10T13:32:00-0400 to 2023-07-20T06:44:39-0400)   Patient had a min HR of 46 bpm, max HR of 179 bpm, and avg HR of 58 bpm. Predominant underlying rhythm was Sinus Rhythm. 1 run of Ventricular Tachycardia occurred lasting 4 beats with a max rate of 169 bpm (avg 156 bpm). 31 Supraventricular Tachycardia  runs occurred, the run with the fastest interval lasting 11 beats with a max rate of 179 bpm, the longest lasting 15.1 secs with an avg rate of 107 bpm. Isolated SVEs were occasional (2.5%, 16848), SVE Couplets were rare (<1.0%, 1094), and SVE Triplets  were rare (<1.0%, 59). Isolated VEs were rare (<1.0%, 685), VE Couplets were rare (<1.0%, 1), and VE Triplets were rare (<1.0%, 1). Ventricular Bigeminy was present.   Assessment and Plan   1. CAD with other forms of angina.  - history of prior stent to LAD - has had chronic intermittent chet pains.  - Jan 2021 and Aug 2022 caths without significant disease.  - continue current meds. Issues with intermittent headaches, will try coming off imdur and update [place] Monday. Could consider ranexa as alternative     2. Chronic diastolic HF - euvolemic, continue prn lasix     3.HTN - at goal, continue current meds - dizziness improved with lowering of meds last visit     Arnoldo Lenis, M.D.

## 2021-11-16 NOTE — Progress Notes (Signed)
Surgical Instructions    Your procedure is scheduled on 11/26/21.  Report to [facility] Main Entrance "A" at 6:30 A.M., then check in with the Admitting office.  Call this number if you have problems the morning of surgery:  [phone]   If you have any questions prior to your surgery date call [phone]: Open Monday-Friday 8am-4pm    Remember:  Do not eat after midnight the night before your surgery  You may drink clear liquids until 5:30am the morning of your surgery.   Clear liquids allowed are: Water, Non-Citrus Juices (without pulp), Carbonated Beverages, Clear Tea, Black Coffee ONLY (NO MILK, CREAM OR POWDERED CREAMER of any kind), and Gatorade    Take these medicines the morning of surgery with A SIP OF WATER:  amLODipine (NORVASC)  budesonide-formoterol (SYMBICORT)  fluticasone (FLONASE)  isosorbide mononitrate (IMDUR)  metoprolol tartrate (LOPRESSOR)  IF NEEDED: albuterol (VENTOLIN HFA) inhaler- bring with you the day of surgery budesonide (PULMICORT) nebulizer diphenhydrAMINE (BENADRYL)  ipratropium-albuterol (DUONEB) nitroGLYCERIN (NITROSTAT)  As of today, STOP taking any Aspirin (unless otherwise instructed by your surgeon) Aleve, Naproxen, Ibuprofen, Motrin, Advil, Goody's, BC's, all herbal medications, fish oil, and all vitamins.  **Please follow your surgeons instructions in regards to ticagrelor [facility]). If no instructions were given, please reach out to your surgeons office. **  WHAT DO I DO ABOUT MY DIABETES MEDICATION?   Do not take oral diabetes medicines (pills) the morning of surgery.      THE MORNING OF SURGERY, take 16 units of insulin degludec (TRESIBA FLEXTOUCH). Do not take pioglitazone (ACTOS).  The day of surgery, do not take other diabetes injectables, including Byetta (exenatide), Bydureon (exenatide ER), Victoza (liraglutide), or Trulicity (dulaglutide).  If your CBG is greater than 220 mg/dL, you may take  of your sliding scale  (correction) dose of insulin.   HOW TO MANAGE YOUR DIABETES BEFORE AND AFTER SURGERY  Why is it important to control my blood sugar before and after surgery? Improving blood sugar levels before and after surgery helps healing and can limit problems. A way of improving blood sugar control is eating a healthy diet by:  Eating less sugar and carbohydrates  Increasing activity/exercise  Talking with your doctor about reaching your blood sugar goals High blood sugars (greater than 180 mg/dL) can raise your risk of infections and slow your recovery, so you will need to focus on controlling your diabetes during the weeks before surgery. Make sure that the doctor who takes care of your diabetes knows about your planned surgery including the date and location.  How do I manage my blood sugar before surgery? Check your blood sugar at least 4 times a day, starting 2 days before surgery, to make sure that the level is not too high or low.  Check your blood sugar the morning of your surgery when you wake up and every 2 hours until you get to the Short Stay unit.  If your blood sugar is less than 70 mg/dL, you will need to treat for low blood sugar: Do not take insulin. Treat a low blood sugar (less than 70 mg/dL) with  cup of clear juice (cranberry or apple), 4 glucose tablets, OR glucose gel. Recheck blood sugar in 15 minutes after treatment (to make sure it is greater than 70 mg/dL). If your blood sugar is not greater than 70 mg/dL on recheck, call [phone] for further instructions. Report your blood sugar to the short stay nurse when you get to Short Stay.  If  you are admitted to the hospital after surgery: Your blood sugar will be checked by the staff and you will probably be given insulin after surgery (instead of oral diabetes medicines) to make sure you have good blood sugar levels. The goal for blood sugar control after surgery is 80-180 mg/dL.           Do not wear jewelry or  makeup. Do not wear lotions, powders, perfumes/cologne or deodorant. Do not shave 48 hours prior to surgery.   Do not bring valuables to the hospital. Do not wear nail polish, gel polish, artificial nails, or any other type of covering on natural nails (fingers and toes) If you have artificial nails or gel coating that need to be removed by a nail salon, please have this removed prior to surgery. Artificial nails or gel coating may interfere with anesthesia's ability to adequately monitor your vital signs.  [facility] is not responsible for any belongings or valuables.    Do NOT Smoke (Tobacco/Vaping)  24 hours prior to your procedure  If you use a CPAP at night, you may bring your mask for your overnight stay.   Contacts, glasses, hearing aids, dentures or partials may not be worn into surgery, please bring cases for these belongings   For patients admitted to the hospital, discharge time will be determined by your treatment team.   Patients discharged the day of surgery will not be allowed to drive home, and someone needs to stay with them for 24 hours.   SURGICAL WAITING ROOM VISITATION Patients having surgery or a procedure may have no more than 2 support people in the waiting area - these visitors may rotate.   Children under the age of 28 must have an adult with them who is not the patient. If the patient needs to stay at the hospital during part of their recovery, the visitor guidelines for inpatient rooms apply. Pre-op nurse will coordinate an appropriate time for 1 support person to accompany patient in pre-op.  This support person may not rotate.   Please refer to the [facility] website for the visitor guidelines for Inpatients (after your surgery is over and you are in a regular room).    Special instructions:    Oral Hygiene is also important to reduce your risk of infection.  Remember - BRUSH YOUR TEETH THE MORNING OF SURGERY WITH YOUR REGULAR TOOTHPASTE   [place]-  Preparing For Surgery  Before surgery, you can play an important role. Because skin is not sterile, your skin needs to be as free of germs as possible. You can reduce the number of germs on your skin by washing with CHG (chlorahexidine gluconate) Soap before surgery.  CHG is an antiseptic cleaner which kills germs and bonds with the skin to continue killing germs even after washing.     Please do not use if you have an allergy to CHG or antibacterial soaps. If your skin becomes reddened/irritated stop using the CHG.  Do not shave (including legs and underarms) for at least 48 hours prior to first CHG shower. It is OK to shave your face.  Please follow these instructions carefully.     Shower the NIGHT BEFORE SURGERY and the MORNING OF SURGERY with CHG Soap.   If you chose to wash your hair, wash your hair first as usual with your normal shampoo. After you shampoo, rinse your hair and body thoroughly to remove the shampoo.  Then [employer] and genitals (private parts) with your  normal soap and rinse thoroughly to remove soap.  After that Use CHG Soap as you would any other liquid soap. You can apply CHG directly to the skin and wash gently with a scrungie or a clean washcloth.   Apply the CHG Soap to your body ONLY FROM THE NECK DOWN.  Do not use on open wounds or open sores. Avoid contact with your eyes, ears, mouth and genitals (private parts). Wash Face and genitals (private parts)  with your normal soap.   Wash thoroughly, paying special attention to the area where your surgery will be performed.  Thoroughly rinse your body with warm water from the neck down.  DO NOT shower/wash with your normal soap after using and rinsing off the CHG Soap.  Pat yourself dry with a CLEAN TOWEL.  Wear CLEAN PAJAMAS to bed the night before surgery  Place CLEAN SHEETS on your bed the night before your surgery  DO NOT SLEEP WITH PETS.   Day of Surgery: Take a shower with CHG soap. Wear  Clean/Comfortable clothing the morning of surgery Do not apply any deodorants/lotions.   Remember to brush your teeth WITH YOUR REGULAR TOOTHPASTE.    If you received a COVID test during your pre-op visit, it is requested that you wear a mask when out in public, stay away from anyone that may not be feeling well, and notify your surgeon if you develop symptoms. If you have been in contact with anyone that has tested positive in the last 10 days, please notify your surgeon.    Please read over the following fact sheets that you were given.

## 2021-11-17 ENCOUNTER — Encounter (HOSPITAL_COMMUNITY): Payer: Self-pay

## 2021-11-17 ENCOUNTER — Other Ambulatory Visit: Payer: Self-pay

## 2021-11-17 ENCOUNTER — Encounter (HOSPITAL_COMMUNITY)
Admission: RE | Admit: 2021-11-17 | Discharge: 2021-11-17 | Disposition: A | Discharge status: Home / Self Care | Payer: Medicare HMO | Source: Ambulatory Visit | Attending: Neurosurgery | Admitting: Neurosurgery

## 2021-11-17 VITALS — BP 123/59 | HR 51 | Temp 97.7°F | Resp 18 | Ht 63.0 in | Wt 238.2 lb

## 2021-11-17 DIAGNOSIS — J4540 Moderate persistent asthma, uncomplicated: Secondary | ICD-10-CM | POA: Diagnosis not present

## 2021-11-17 DIAGNOSIS — I110 Hypertensive heart disease with heart failure: Secondary | ICD-10-CM | POA: Diagnosis not present

## 2021-11-17 DIAGNOSIS — R079 Chest pain, unspecified: Secondary | ICD-10-CM | POA: Insufficient documentation

## 2021-11-17 DIAGNOSIS — Z01812 Encounter for preprocedural laboratory examination: Secondary | ICD-10-CM | POA: Diagnosis not present

## 2021-11-17 DIAGNOSIS — I25119 Atherosclerotic heart disease of native coronary artery with unspecified angina pectoris: Secondary | ICD-10-CM | POA: Diagnosis not present

## 2021-11-17 DIAGNOSIS — I472 Ventricular tachycardia, unspecified: Secondary | ICD-10-CM | POA: Insufficient documentation

## 2021-11-17 DIAGNOSIS — E1159 Type 2 diabetes mellitus with other circulatory complications: Secondary | ICD-10-CM | POA: Diagnosis not present

## 2021-11-17 DIAGNOSIS — G4733 Obstructive sleep apnea (adult) (pediatric): Secondary | ICD-10-CM | POA: Insufficient documentation

## 2021-11-17 DIAGNOSIS — I5032 Chronic diastolic (congestive) heart failure: Secondary | ICD-10-CM | POA: Insufficient documentation

## 2021-11-17 DIAGNOSIS — Z01818 Encounter for other preprocedural examination: Secondary | ICD-10-CM

## 2021-11-17 HISTORY — DX: Pneumonia, unspecified organism: J18.9

## 2021-11-17 HISTORY — DX: Dyspnea, unspecified: R06.00

## 2021-11-17 HISTORY — DX: Atherosclerotic heart disease of native coronary artery without angina pectoris: I25.10

## 2021-11-17 LAB — SURGICAL PCR SCREEN
MRSA, PCR: NEGATIVE
Staphylococcus aureus: NEGATIVE

## 2021-11-17 LAB — HEMOGLOBIN A1C
Hgb A1c MFr Bld: 6.1 % — ABNORMAL HIGH (ref 4.8–5.6)
Mean Plasma Glucose: 128.37 mg/dL

## 2021-11-17 LAB — CBC
HCT: 38.4 % (ref 36.0–46.0)
Hemoglobin: 12.4 g/dL (ref 12.0–15.0)
MCH: 31.9 pg (ref 26.0–34.0)
MCHC: 32.3 g/dL (ref 30.0–36.0)
MCV: 98.7 fL (ref 80.0–100.0)
Platelets: 226 10*3/uL (ref 150–400)
RBC: 3.89 MIL/uL (ref 3.87–5.11)
RDW: 13.4 % (ref 11.5–15.5)
WBC: 7.8 10*3/uL (ref 4.0–10.5)
nRBC: 0 % (ref 0.0–0.2)

## 2021-11-17 LAB — BASIC METABOLIC PANEL
Anion gap: 8 (ref 5–15)
BUN: 21 mg/dL (ref 8–23)
CO2: 22 mmol/L (ref 22–32)
Calcium: 9.1 mg/dL (ref 8.9–10.3)
Chloride: 108 mmol/L (ref 98–111)
Creatinine, Ser: 1.28 mg/dL — ABNORMAL HIGH (ref 0.44–1.00)
GFR, Estimated: 45 mL/min — ABNORMAL LOW (ref >60)
Glucose, Bld: 156 mg/dL — ABNORMAL HIGH (ref 70–99)
Potassium: 4.2 mmol/L (ref 3.5–5.1)
Sodium: 138 mmol/L (ref 135–145)

## 2021-11-17 LAB — GLUCOSE, CAPILLARY: Glucose-Capillary: 150 mg/dL — ABNORMAL HIGH (ref 70–99)

## 2021-11-17 NOTE — Progress Notes (Signed)
PCP - Ronnie Doss Cardiologist - jonathan branch  PPM/ICD - denies   Chest x-ray - n/a EKG - 07/26/21 Stress Test - 02/16/18 ECHO - 07/07/20 Cardiac Cath - 09/22/20  Sleep Study - +OSA, does not wear cpap due to it "causing pneumonia" CPAP -   Fasting Blood Sugar - 70-100 Checks Blood Sugar once a day  As of today, STOP taking any Aspirin (unless otherwise instructed by your surgeon) Aleve, Naproxen, Ibuprofen, Motrin, Advil, Goody's, BC's, all herbal medications, fish oil, and all vitamins.   **Please follow your surgeons instructions in regards to t[facility]relor [facility]). If no instructions were given, please reach out to your surgeons office. **Blood Thinner Instructions: Aspirin Instructions:  ERAS Protcol -yes PRE-SURGERY Ensure or G2- no  COVID TEST- not needed   Anesthesia review: yes, significant history   Patient denies shortness of breath, fever, cough and chest pain at PAT appointment   All instructions explained to the patient, with a verbal understanding of the material. Patient agrees to go over the instructions while at home for a better understanding. Patient also instructed to self quarantine after being tested for COVID-19. The opportunity to ask questions was provided.

## 2021-11-18 ENCOUNTER — Telehealth: Payer: Self-pay | Admitting: Cardiology

## 2021-11-18 NOTE — Anesthesia Preprocedure Evaluation (Addendum)
Anesthesia Evaluation  Patient identified by MRN, date of birth, ID band Patient awake    Reviewed: Allergy & Precautions, NPO status , Patient's Chart, lab work & pertinent test results, reviewed documented beta blocker date and time   History of Anesthesia Complications Negative for: history of anesthetic complications  Airway Mallampati: II  TM Distance: >3 FB Neck ROM: Full    Dental  (+) Edentulous Upper, Edentulous Lower, Upper Dentures   Pulmonary shortness of breath, asthma , sleep apnea (does not use CPAP) , COPD,  COPD inhaler   breath sounds clear to auscultation       Cardiovascular hypertension, Pt. on medications and Pt. on home beta blockers + CAD, + Cardiac Stents, + Peripheral Vascular Disease and + DOE   Rhythm:Regular Rate:Normal  '22 ECHO: EF 65-70%. The LV has normal function. Left ventricular endocardial border  not optimally defined to evaluate regional wall motion. There is mild LVH. 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    GI/Hepatic Neg liver ROS,GERD  Controlled,,  Endo/Other  diabetes (glu 117), Insulin Dependent, Oral Hypoglycemic Agents  BMI 41.5  Renal/GU negative Renal ROS     Musculoskeletal  (+) Arthritis ,    Abdominal  (+) + obese  Peds  Hematology Brilinta   Anesthesia Other Findings   Reproductive/Obstetrics                              Anesthesia Physical Anesthesia Plan  ASA: 3  Anesthesia Plan: General   Post-op Pain Management: Tylenol PO (pre-op)*   Induction: Intravenous  PONV Risk Score and Plan: 3 and Ondansetron, Dexamethasone and Treatment may vary due to age or medical condition  Airway Management Planned: Oral ETT  Additional Equipment: Arterial line  Intra-op Plan:   Post-operative Plan: Extubation in OR  Informed Consent: I have reviewed the patients  History and Physical, chart, labs and discussed the procedure including the risks, benefits and alternatives for the proposed anesthesia with the patient or authorized representative who has indicated his/her understanding and acceptance.       Plan Discussed with: CRNA and Surgeon  Anesthesia Plan Comments: (PAT note by Karoline Caldwell, PA-C: Follows with cardiology for history of CAD s/p DES to mid LAD (presented with chest pain at that time, not an MI), diastolic heart failure, HTN.  She is a chronic chest pain since that time.  She was last seen by Dr. Harl Bowie 11/11/2021.  Per note, "Assessment and Plan : 1. CAD with other forms of angina.  - history of prior stent to LAD - has had chronic intermittent chet pains.  - Jan 2021 and Aug 2022 caths without significant disease. - continue current meds. Issues with intermittent headaches, will try coming off imdur and update [place] Monday. Could consider ranexa as alternative. 2. Chronic diastolic HF - euvolemic, continue prn lasix. 3.HTN - at goal, continue current meds - dizziness improved with lowering of meds last visit."  Per follow-up telephone encounter 11/10/2021, patient's headaches did not improve with discontinuation of Imdur, so she restarted the medication.  Moderate persistent asthma, maintained on Breztri and as needed albuterol.  OSA, intolerant to CPAP.  IDDM2, well controlled, A1c 6.1 on preop labs.   Labs reviewed, creatinine mildly elevated 1.28, otherwise unremarkable.  EKG 07/25/21: Sinus rhythm. Rate 57. Left axis deviation. Abnormal R-wave progression, late transition  Event monitor 09/17/2021: Addendum: Over read. Agree with autoread.  Avoid caffeine. Hydrate well.  Given frequency of SVT, will cc to her cardiologist for any additional recommendations, route to Dr Harl Bowie.   Patch Wear Time:  9 days and 17 hours (2023-07-10T13:32:00-0400 to 2023-07-20T06:44:39-0400)   Patient had a min HR of 46 bpm, max HR of 179 bpm, and avg HR of  58 bpm. Predominant underlying rhythm was Sinus Rhythm. 1 run of Ventricular Tachycardia occurred lasting 4 beats with a max rate of 169 bpm (avg 156 bpm). 31 Supraventricular Tachycardia  runs occurred, the run with the fastest interval lasting 11 beats with a max rate of 179 bpm, the longest lasting 15.1 secs with an avg rate of 107 bpm. Isolated SVEs were occasional (2.5%, 16848), SVE Couplets were rare (<1.0%, 1094), and SVE Triplets  were rare (<1.0%, 59). Isolated VEs were rare (<1.0%, 685), VE Couplets were rare (<1.0%, 1), and VE Triplets were rare (<1.0%, 1). Ventricular Bigeminy was present.   Right/left heart cath 09/22/2020: SUMMARY  Stable single-vessel CAD with widely patent LAD stent.  LAD tapers distally to a very small caliber vessel with a small diagonal branch.  Several tiny diagonal branches are jailed by the stent, but no options for PCI.    o Stable from there is catheterization in January 2021.  Otherwise normal large-caliber LCx and RCA.  Mild to moderately elevated LVEDP and PCWP with normal Right Heart Cath Pressures-no evidence of Pulmonary Hypertension.  Systemic Hypertension  Cardiac Output and Index (Fick) 8.37-4.08.    PVR 0.6-1.22 Sherral Hammers   Consider microvascular disease, hypertensive heart disease as other potential etiologies from a cardiac standpoint for chest pain versus noncardiac chest pain.   Consider calcium channel blocker and beta-blocker titration as opposed to long-acting nitrate.  TTE 07/07/2020:  1. Left ventricular ejection fraction, by estimation, is 65 to 70%. The  left ventricle has normal function. Left ventricular endocardial border  not optimally defined to evaluate regional wall motion. There is mild left  ventricular hypertrophy. Left  ventricular diastolic parameters are consistent with Grade II diastolic  dysfunction (pseudonormalization). Normal global longitudinal strain of  -19.8%.   2. Right ventricular systolic function is normal. The  right ventricular  size is normal. There is normal pulmonary artery systolic pressure. The  estimated right ventricular systolic pressure is 81.1 mmHg.   3. Left atrial size was mildly dilated.   4. Right atrial size was mildly dilated.   5. The mitral valve is grossly normal. Trivial mitral valve  regurgitation.   6. The aortic valve has an indeterminant number of cusps. Aortic valve  regurgitation is not visualized.   7. The inferior vena cava is normal in size with greater than 50%  respiratory variability, suggesting right atrial pressure of 3 mmHg.   Comparison(s): Echocardiogram done 11/20/17 showed an EF of 60-65%.   )         Anesthesia Quick Evaluation

## 2021-11-18 NOTE — Progress Notes (Signed)
Anesthesia Chart Review:  Follows with cardiology for history of CAD s/p DES to mid LAD (presented with chest pain at that time, not an MI), diastolic heart failure, HTN.  She is a chronic chest pain since that time.  She was last seen by Dr. Harl Bowie 11/11/2021.  Per note, "Assessment and Plan : 1. CAD with other forms of angina.  - history of prior stent to LAD - has had chronic intermittent chet pains.  - Jan 2021 and Aug 2022 caths without significant disease. - continue current meds. Issues with intermittent headaches, will try coming off imdur and update [place] Monday. Could consider ranexa as alternative. 2. Chronic diastolic HF - euvolemic, continue prn lasix. 3.HTN - at goal, continue current meds - dizziness improved with lowering of meds last visit."  Per follow-up telephone encounter 11/10/2021, patient's headaches did not improve with discontinuation of Imdur, so she restarted the medication.  Moderate persistent asthma, maintained on Breztri and as needed albuterol.  OSA, intolerant to CPAP.  IDDM2, well controlled, A1c 6.1 on preop labs.   Labs reviewed, creatinine mildly elevated 1.28, otherwise unremarkable.  EKG 07/25/21: Sinus rhythm. Rate 57. Left axis deviation. Abnormal R-wave progression, late transition  Event monitor 09/17/2021: Addendum: Over read. Agree with autoread.  Avoid caffeine. Hydrate well.  Given frequency of SVT, will cc to her cardiologist for any additional recommendations, route to Dr Harl Bowie.   Patch Wear Time:  9 days and 17 hours (2023-07-10T13:32:00-0400 to 2023-07-20T06:44:39-0400)   Patient had a min HR of 46 bpm, max HR of 179 bpm, and avg HR of 58 bpm. Predominant underlying rhythm was Sinus Rhythm. 1 run of Ventricular Tachycardia occurred lasting 4 beats with a max rate of 169 bpm (avg 156 bpm). 31 Supraventricular Tachycardia  runs occurred, the run with the fastest interval lasting 11 beats with a max rate of 179 bpm, the longest lasting 15.1 secs with an  avg rate of 107 bpm. Isolated SVEs were occasional (2.5%, 16848), SVE Couplets were rare (<1.0%, 1094), and SVE Triplets  were rare (<1.0%, 59). Isolated VEs were rare (<1.0%, 685), VE Couplets were rare (<1.0%, 1), and VE Triplets were rare (<1.0%, 1). Ventricular Bigeminy was present.   Right/left heart cath 09/22/2020: SUMMARY Stable single-vessel CAD with widely patent LAD stent.  LAD tapers distally to a very small caliber vessel with a small diagonal branch.  Several tiny diagonal branches are jailed by the stent, but no options for PCI.   Stable from there is catheterization in January 2021. Otherwise normal large-caliber LCx and RCA. Mild to moderately elevated LVEDP and PCWP with normal Right Heart Cath Pressures-no evidence of Pulmonary Hypertension. Systemic Hypertension Cardiac Output and Index (Fick) 8.37-4.08.   PVR 0.6-1.22 Sherral Hammers   Consider microvascular disease, hypertensive heart disease as other potential etiologies from a cardiac standpoint for chest pain versus noncardiac chest pain.   Consider calcium channel blocker and beta-blocker titration as opposed to long-acting nitrate.  TTE 07/07/2020:  1. Left ventricular ejection fraction, by estimation, is 65 to 70%. The  left ventricle has normal function. Left ventricular endocardial border  not optimally defined to evaluate regional wall motion. There is mild left  ventricular hypertrophy. Left  ventricular diastolic parameters are consistent with Grade II diastolic  dysfunction (pseudonormalization). Normal global longitudinal strain of  -19.8%.   2. Right ventricular systolic function is normal. The right ventricular  size is normal. There is normal pulmonary artery systolic pressure. The  estimated right ventricular systolic pressure is 35.9  mmHg.   3. Left atrial size was mildly dilated.   4. Right atrial size was mildly dilated.   5. The mitral valve is grossly normal. Trivial mitral valve  regurgitation.   6. The  aortic valve has an indeterminant number of cusps. Aortic valve  regurgitation is not visualized.   7. The inferior vena cava is normal in size with greater than 50%  respiratory variability, suggesting right atrial pressure of 3 mmHg.   Comparison(s): Echocardiogram done 11/20/17 showed an EF of 60-65%.      Wynonia Musty [facility] Short Stay Center/Anesthesiology Phone [phone] 11/18/2021 3:05 PM

## 2021-11-18 NOTE — Telephone Encounter (Signed)
Will fwd to provider.

## 2021-11-18 NOTE — Telephone Encounter (Signed)
Pt c/o medication issue:  1. Name of Medication: isosorbide mononitrate (IMDUR) 30 MG 24 hr tablet  2. How are you currently taking this medication (dosage and times per day)? Started med back Monday 09/25  3. Are you having a reaction (difficulty breathing--STAT)?   4. What is your medication issue? Pt is requesting call back to let Dr. Harl Bowie know that she started this medication back up because she continued to have headaches. She was told to call and give update.

## 2021-11-19 NOTE — Telephone Encounter (Signed)
I spoke with patient and she had an MRI and they found cerebral aneurysm and she is having surgery.    I will FYI Dr.Branch

## 2021-11-19 NOTE — Telephone Encounter (Signed)
Thx for update, would touch base with pcp about headaches. Appears not related to imdur  Zandra Abts MD

## 2021-11-22 ENCOUNTER — Other Ambulatory Visit: Payer: Self-pay | Admitting: Family Medicine

## 2021-11-22 DIAGNOSIS — E1159 Type 2 diabetes mellitus with other circulatory complications: Secondary | ICD-10-CM

## 2021-11-22 DIAGNOSIS — I25119 Atherosclerotic heart disease of native coronary artery with unspecified angina pectoris: Secondary | ICD-10-CM

## 2021-11-26 ENCOUNTER — Other Ambulatory Visit: Payer: Self-pay

## 2021-11-26 ENCOUNTER — Encounter (HOSPITAL_COMMUNITY)
Admission: RE | Disposition: A | Discharge status: Home / Self Care | Payer: Self-pay | Source: Home / Self Care | Attending: Neurosurgery

## 2021-11-26 ENCOUNTER — Inpatient Hospital Stay (HOSPITAL_COMMUNITY)
Admission: RE | Admit: 2021-11-26 | Discharge: 2021-11-26 | Disposition: A | Discharge status: Home / Self Care | Payer: Medicare HMO | Source: Ambulatory Visit | Attending: Neurosurgery | Admitting: Neurosurgery

## 2021-11-26 ENCOUNTER — Encounter (HOSPITAL_COMMUNITY): Payer: Self-pay | Admitting: Neurosurgery

## 2021-11-26 ENCOUNTER — Ambulatory Visit: Payer: Medicare HMO | Admitting: Cardiology

## 2021-11-26 ENCOUNTER — Inpatient Hospital Stay (HOSPITAL_COMMUNITY): Payer: Medicare HMO

## 2021-11-26 ENCOUNTER — Inpatient Hospital Stay (HOSPITAL_COMMUNITY): Payer: Medicare HMO | Admitting: Certified Registered Nurse Anesthetist

## 2021-11-26 ENCOUNTER — Inpatient Hospital Stay (HOSPITAL_COMMUNITY): Payer: Medicare HMO | Admitting: Physician Assistant

## 2021-11-26 ENCOUNTER — Inpatient Hospital Stay (HOSPITAL_COMMUNITY)
Admission: RE | Admit: 2021-11-26 | Discharge: 2021-11-27 | DRG: 027 | Disposition: A | Discharge status: Home / Self Care | Payer: Medicare HMO | Attending: Neurosurgery | Admitting: Neurosurgery

## 2021-11-26 DIAGNOSIS — Z880 Allergy status to penicillin: Secondary | ICD-10-CM | POA: Diagnosis not present

## 2021-11-26 DIAGNOSIS — I10 Essential (primary) hypertension: Secondary | ICD-10-CM | POA: Diagnosis not present

## 2021-11-26 DIAGNOSIS — G4730 Sleep apnea, unspecified: Secondary | ICD-10-CM

## 2021-11-26 DIAGNOSIS — Z8249 Family history of ischemic heart disease and other diseases of the circulatory system: Secondary | ICD-10-CM | POA: Diagnosis not present

## 2021-11-26 DIAGNOSIS — H9191 Unspecified hearing loss, right ear: Secondary | ICD-10-CM | POA: Diagnosis not present

## 2021-11-26 DIAGNOSIS — Z794 Long term (current) use of insulin: Secondary | ICD-10-CM

## 2021-11-26 DIAGNOSIS — F418 Other specified anxiety disorders: Secondary | ICD-10-CM | POA: Diagnosis not present

## 2021-11-26 DIAGNOSIS — Z7982 Long term (current) use of aspirin: Secondary | ICD-10-CM

## 2021-11-26 DIAGNOSIS — K219 Gastro-esophageal reflux disease without esophagitis: Secondary | ICD-10-CM | POA: Diagnosis present

## 2021-11-26 DIAGNOSIS — F32A Depression, unspecified: Secondary | ICD-10-CM | POA: Diagnosis present

## 2021-11-26 DIAGNOSIS — E785 Hyperlipidemia, unspecified: Secondary | ICD-10-CM | POA: Diagnosis present

## 2021-11-26 DIAGNOSIS — H9313 Tinnitus, bilateral: Secondary | ICD-10-CM | POA: Diagnosis present

## 2021-11-26 DIAGNOSIS — E119 Type 2 diabetes mellitus without complications: Secondary | ICD-10-CM | POA: Diagnosis present

## 2021-11-26 DIAGNOSIS — E1159 Type 2 diabetes mellitus with other circulatory complications: Secondary | ICD-10-CM

## 2021-11-26 DIAGNOSIS — Z91018 Allergy to other foods: Secondary | ICD-10-CM

## 2021-11-26 DIAGNOSIS — Z955 Presence of coronary angioplasty implant and graft: Secondary | ICD-10-CM

## 2021-11-26 DIAGNOSIS — Z90710 Acquired absence of both cervix and uterus: Secondary | ICD-10-CM

## 2021-11-26 DIAGNOSIS — M7989 Other specified soft tissue disorders: Secondary | ICD-10-CM | POA: Diagnosis not present

## 2021-11-26 DIAGNOSIS — Z79899 Other long term (current) drug therapy: Secondary | ICD-10-CM

## 2021-11-26 DIAGNOSIS — I2510 Atherosclerotic heart disease of native coronary artery without angina pectoris: Secondary | ICD-10-CM | POA: Diagnosis present

## 2021-11-26 DIAGNOSIS — M1990 Unspecified osteoarthritis, unspecified site: Secondary | ICD-10-CM | POA: Diagnosis not present

## 2021-11-26 DIAGNOSIS — Z96653 Presence of artificial knee joint, bilateral: Secondary | ICD-10-CM | POA: Diagnosis present

## 2021-11-26 DIAGNOSIS — Z9049 Acquired absence of other specified parts of digestive tract: Secondary | ICD-10-CM

## 2021-11-26 DIAGNOSIS — Z888 Allergy status to other drugs, medicaments and biological substances status: Secondary | ICD-10-CM | POA: Diagnosis not present

## 2021-11-26 DIAGNOSIS — I671 Cerebral aneurysm, nonruptured: Secondary | ICD-10-CM | POA: Diagnosis not present

## 2021-11-26 DIAGNOSIS — J449 Chronic obstructive pulmonary disease, unspecified: Secondary | ICD-10-CM

## 2021-11-26 DIAGNOSIS — M19012 Primary osteoarthritis, left shoulder: Secondary | ICD-10-CM | POA: Diagnosis not present

## 2021-11-26 DIAGNOSIS — F419 Anxiety disorder, unspecified: Secondary | ICD-10-CM | POA: Diagnosis present

## 2021-11-26 DIAGNOSIS — Z7951 Long term (current) use of inhaled steroids: Secondary | ICD-10-CM | POA: Diagnosis not present

## 2021-11-26 HISTORY — PX: IR ANGIOGRAM FOLLOW UP STUDY: IMG697

## 2021-11-26 HISTORY — PX: IR TRANSCATH/EMBOLIZ: IMG695

## 2021-11-26 HISTORY — PX: RADIOLOGY WITH ANESTHESIA: SHX6223

## 2021-11-26 HISTORY — PX: IR ANGIO INTRA EXTRACRAN SEL INTERNAL CAROTID UNI R MOD SED: IMG5362

## 2021-11-26 HISTORY — PX: IR 3D INDEPENDENT WKST: IMG2385

## 2021-11-26 LAB — TYPE AND SCREEN
ABO/RH(D): A NEG
Antibody Screen: POSITIVE

## 2021-11-26 LAB — GLUCOSE, CAPILLARY
Glucose-Capillary: 117 mg/dL — ABNORMAL HIGH (ref 70–99)
Glucose-Capillary: 124 mg/dL — ABNORMAL HIGH (ref 70–99)
Glucose-Capillary: 146 mg/dL — ABNORMAL HIGH (ref 70–99)
Glucose-Capillary: 160 mg/dL — ABNORMAL HIGH (ref 70–99)
Glucose-Capillary: 207 mg/dL — ABNORMAL HIGH (ref 70–99)

## 2021-11-26 SURGERY — IR WITH ANESTHESIA
Anesthesia: General

## 2021-11-26 MED ORDER — AMLODIPINE BESYLATE 10 MG PO TABS
10.0000 mg | ORAL_TABLET | Freq: Every day | ORAL | Status: DC
  Administered 2021-11-27: 10 mg via ORAL
  Filled 2021-11-26: qty 1

## 2021-11-26 MED ORDER — OXYCODONE HCL 5 MG PO TABS: 5.0000 mg | ORAL_TABLET | Freq: Once | ORAL | Status: DC | PRN

## 2021-11-26 MED ORDER — INSULIN ASPART 100 UNIT/ML IJ SOLN
0.0000 [IU] | Freq: Three times a day (TID) | INTRAMUSCULAR | Status: DC
  Administered 2021-11-26 – 2021-11-27 (×2): 3 [IU] via SUBCUTANEOUS
  Administered 2021-11-27: 2 [IU] via SUBCUTANEOUS

## 2021-11-26 MED ORDER — POTASSIUM CHLORIDE CRYS ER 10 MEQ PO TBCR
10.0000 meq | EXTENDED_RELEASE_TABLET | Freq: Every day | ORAL | Status: DC
  Administered 2021-11-27: 10 meq via ORAL
  Filled 2021-11-26: qty 1

## 2021-11-26 MED ORDER — LACTATED RINGERS IV SOLN: INTRAVENOUS | Status: DC | PRN

## 2021-11-26 MED ORDER — MORPHINE SULFATE (PF) 2 MG/ML IV SOLN
1.0000 mg | INTRAVENOUS | Status: DC | PRN
  Administered 2021-11-26 – 2021-11-27 (×4): 2 mg via INTRAVENOUS
  Filled 2021-11-26 (×3): qty 1

## 2021-11-26 MED ORDER — LISINOPRIL 20 MG PO TABS
20.0000 mg | ORAL_TABLET | Freq: Every day | ORAL | Status: DC
  Administered 2021-11-26 – 2021-11-27 (×2): 20 mg via ORAL
  Filled 2021-11-26 (×2): qty 1

## 2021-11-26 MED ORDER — LACTATED RINGERS IV SOLN: INTRAVENOUS | Status: DC

## 2021-11-26 MED ORDER — ASPIRIN 81 MG PO TBEC
81.0000 mg | DELAYED_RELEASE_TABLET | Freq: Every morning | ORAL | Status: DC
  Administered 2021-11-27: 81 mg via ORAL
  Filled 2021-11-26: qty 1

## 2021-11-26 MED ORDER — CHLORHEXIDINE GLUCONATE 0.12 % MT SOLN
15.0000 mL | Freq: Once | OROMUCOSAL | Status: AC
  Administered 2021-11-26: 15 mL via OROMUCOSAL
  Filled 2021-11-26: qty 15

## 2021-11-26 MED ORDER — ASPIRIN 81 MG PO CHEW
81.0000 mg | CHEWABLE_TABLET | Freq: Once | ORAL | Status: AC
  Administered 2021-11-26: 81 mg via ORAL

## 2021-11-26 MED ORDER — BUDESONIDE 0.25 MG/2ML IN SUSP: 0.2500 mg | Freq: Two times a day (BID) | RESPIRATORY_TRACT | Status: DC | PRN

## 2021-11-26 MED ORDER — CLEVIDIPINE BUTYRATE 0.5 MG/ML IV EMUL
INTRAVENOUS | Status: AC
  Filled 2021-11-26: qty 50

## 2021-11-26 MED ORDER — INSULIN GLARGINE-YFGN 100 UNIT/ML ~~LOC~~ SOLN
32.0000 [IU] | Freq: Every morning | SUBCUTANEOUS | Status: DC
  Administered 2021-11-27: 32 [IU] via SUBCUTANEOUS
  Filled 2021-11-26 (×2): qty 0.32

## 2021-11-26 MED ORDER — SODIUM CHLORIDE 0.9 % IV SOLN: INTRAVENOUS | Status: DC

## 2021-11-26 MED ORDER — METOPROLOL TARTRATE 25 MG PO TABS
25.0000 mg | ORAL_TABLET | Freq: Two times a day (BID) | ORAL | Status: DC
  Administered 2021-11-26 – 2021-11-27 (×2): 25 mg via ORAL
  Filled 2021-11-26 (×2): qty 1

## 2021-11-26 MED ORDER — LABETALOL HCL 5 MG/ML IV SOLN
10.0000 mg | INTRAVENOUS | Status: DC | PRN
  Administered 2021-11-26: 10 mg via INTRAVENOUS
  Filled 2021-11-26: qty 4

## 2021-11-26 MED ORDER — PHENYLEPHRINE HCL-NACL 20-0.9 MG/250ML-% IV SOLN
INTRAVENOUS | Status: DC | PRN
  Administered 2021-11-26: 20 ug/min via INTRAVENOUS

## 2021-11-26 MED ORDER — CLEVIDIPINE BUTYRATE 0.5 MG/ML IV EMUL
INTRAVENOUS | Status: DC | PRN
  Administered 2021-11-26: 2 mg/h via INTRAVENOUS

## 2021-11-26 MED ORDER — IOHEXOL 300 MG/ML  SOLN: 150.0000 mL | Freq: Once | INTRAMUSCULAR | Status: DC | PRN

## 2021-11-26 MED ORDER — OXYCODONE HCL 5 MG/5ML PO SOLN: 5.0000 mg | Freq: Once | ORAL | Status: DC | PRN

## 2021-11-26 MED ORDER — IPRATROPIUM-ALBUTEROL 0.5-2.5 (3) MG/3ML IN SOLN: 3.0000 mL | Freq: Four times a day (QID) | RESPIRATORY_TRACT | Status: DC | PRN

## 2021-11-26 MED ORDER — NITROGLYCERIN 0.4 MG SL SUBL: 0.4000 mg | SUBLINGUAL_TABLET | SUBLINGUAL | Status: DC | PRN

## 2021-11-26 MED ORDER — FENTANYL CITRATE (PF) 100 MCG/2ML IJ SOLN
INTRAMUSCULAR | Status: AC
  Filled 2021-11-26: qty 2

## 2021-11-26 MED ORDER — ALBUTEROL SULFATE HFA 108 (90 BASE) MCG/ACT IN AERS
2.0000 | INHALATION_SPRAY | RESPIRATORY_TRACT | Status: DC | PRN
  Administered 2021-11-27: 2 via RESPIRATORY_TRACT

## 2021-11-26 MED ORDER — SUGAMMADEX SODIUM 200 MG/2ML IV SOLN
INTRAVENOUS | Status: DC | PRN
  Administered 2021-11-26: 200 mg via INTRAVENOUS

## 2021-11-26 MED ORDER — ORAL CARE MOUTH RINSE: 15.0000 mL | Freq: Once | OROMUCOSAL | Status: AC

## 2021-11-26 MED ORDER — INSULIN ASPART 100 UNIT/ML IJ SOLN: 0.0000 [IU] | INTRAMUSCULAR | Status: DC | PRN

## 2021-11-26 MED ORDER — FENTANYL CITRATE (PF) 100 MCG/2ML IJ SOLN
25.0000 ug | INTRAMUSCULAR | Status: DC | PRN
  Administered 2021-11-26: 50 ug via INTRAVENOUS
  Administered 2021-11-26: 25 ug via INTRAVENOUS

## 2021-11-26 MED ORDER — MIDAZOLAM HCL 2 MG/2ML IJ SOLN: 0.5000 mg | Freq: Once | INTRAMUSCULAR | Status: DC | PRN

## 2021-11-26 MED ORDER — MAGNESIUM OXIDE -MG SUPPLEMENT 400 (240 MG) MG PO TABS
400.0000 mg | ORAL_TABLET | Freq: Every day | ORAL | Status: DC
  Administered 2021-11-27: 400 mg via ORAL
  Filled 2021-11-26 (×2): qty 1

## 2021-11-26 MED ORDER — FENTANYL CITRATE (PF) 100 MCG/2ML IJ SOLN
INTRAMUSCULAR | Status: DC | PRN
  Administered 2021-11-26: 100 ug via INTRAVENOUS

## 2021-11-26 MED ORDER — MOMETASONE FURO-FORMOTEROL FUM 200-5 MCG/ACT IN AERO
2.0000 | INHALATION_SPRAY | Freq: Two times a day (BID) | RESPIRATORY_TRACT | Status: DC
  Administered 2021-11-26 – 2021-11-27 (×2): 2 via RESPIRATORY_TRACT
  Filled 2021-11-26: qty 8.8

## 2021-11-26 MED ORDER — ONDANSETRON HCL 4 MG PO TABS: 4.0000 mg | ORAL_TABLET | ORAL | Status: DC | PRN

## 2021-11-26 MED ORDER — ISOSORBIDE MONONITRATE ER 30 MG PO TB24
15.0000 mg | ORAL_TABLET | Freq: Two times a day (BID) | ORAL | Status: DC
  Administered 2021-11-26 – 2021-11-27 (×2): 15 mg via ORAL
  Filled 2021-11-26 (×2): qty 1

## 2021-11-26 MED ORDER — DEXAMETHASONE SODIUM PHOSPHATE 10 MG/ML IJ SOLN
INTRAMUSCULAR | Status: DC | PRN
  Administered 2021-11-26: 5 mg via INTRAVENOUS

## 2021-11-26 MED ORDER — ASPIRIN 81 MG PO CHEW
CHEWABLE_TABLET | ORAL | Status: AC
  Filled 2021-11-26: qty 1

## 2021-11-26 MED ORDER — LIDOCAINE 2% (20 MG/ML) 5 ML SYRINGE
INTRAMUSCULAR | Status: DC | PRN
  Administered 2021-11-26: 20 mg via INTRAVENOUS

## 2021-11-26 MED ORDER — EPHEDRINE SULFATE-NACL 50-0.9 MG/10ML-% IV SOSY
PREFILLED_SYRINGE | INTRAVENOUS | Status: DC | PRN
  Administered 2021-11-26: 10 mg via INTRAVENOUS
  Administered 2021-11-26: 5 mg via INTRAVENOUS

## 2021-11-26 MED ORDER — PIOGLITAZONE HCL 15 MG PO TABS
15.0000 mg | ORAL_TABLET | Freq: Every day | ORAL | Status: DC
  Administered 2021-11-27: 15 mg via ORAL
  Filled 2021-11-26: qty 1

## 2021-11-26 MED ORDER — SODIUM CHLORIDE 0.9 % IV SOLN: INTRAVENOUS | Status: DC | PRN

## 2021-11-26 MED ORDER — ROCURONIUM BROMIDE 10 MG/ML (PF) SYRINGE
PREFILLED_SYRINGE | INTRAVENOUS | Status: DC | PRN
  Administered 2021-11-26: 70 mg via INTRAVENOUS
  Administered 2021-11-26: 30 mg via INTRAVENOUS
  Administered 2021-11-26: 10 mg via INTRAVENOUS

## 2021-11-26 MED ORDER — MONTELUKAST SODIUM 10 MG PO TABS
10.0000 mg | ORAL_TABLET | Freq: Every day | ORAL | Status: DC
  Administered 2021-11-26: 10 mg via ORAL
  Filled 2021-11-26 (×2): qty 1

## 2021-11-26 MED ORDER — PANTOPRAZOLE SODIUM 40 MG PO TBEC: 40.0000 mg | DELAYED_RELEASE_TABLET | Freq: Every day | ORAL | Status: DC

## 2021-11-26 MED ORDER — CHLORHEXIDINE GLUCONATE CLOTH 2 % EX PADS: 6.0000 | MEDICATED_PAD | Freq: Every day | CUTANEOUS | Status: DC

## 2021-11-26 MED ORDER — PANTOPRAZOLE SODIUM 40 MG PO TBEC
40.0000 mg | DELAYED_RELEASE_TABLET | Freq: Every day | ORAL | Status: DC
  Administered 2021-11-26: 40 mg via ORAL
  Filled 2021-11-26: qty 1

## 2021-11-26 MED ORDER — PROMETHAZINE HCL 25 MG/ML IJ SOLN: 6.2500 mg | INTRAMUSCULAR | Status: DC | PRN

## 2021-11-26 MED ORDER — PROPOFOL 10 MG/ML IV BOLUS
INTRAVENOUS | Status: DC | PRN
  Administered 2021-11-26: 30 mg via INTRAVENOUS
  Administered 2021-11-26: 20 mg via INTRAVENOUS
  Administered 2021-11-26: 80 mg via INTRAVENOUS

## 2021-11-26 MED ORDER — MORPHINE SULFATE (PF) 2 MG/ML IV SOLN
INTRAVENOUS | Status: AC
  Filled 2021-11-26: qty 1

## 2021-11-26 MED ORDER — HYDROCODONE-ACETAMINOPHEN 5-325 MG PO TABS
1.0000 | ORAL_TABLET | ORAL | Status: DC | PRN
  Administered 2021-11-26 – 2021-11-27 (×3): 1 via ORAL
  Filled 2021-11-26 (×3): qty 1

## 2021-11-26 MED ORDER — ONDANSETRON HCL 4 MG/2ML IJ SOLN: 4.0000 mg | INTRAMUSCULAR | Status: DC | PRN

## 2021-11-26 MED ORDER — ALBUTEROL SULFATE HFA 108 (90 BASE) MCG/ACT IN AERS
INHALATION_SPRAY | RESPIRATORY_TRACT | Status: DC | PRN
  Administered 2021-11-26: 4 via RESPIRATORY_TRACT

## 2021-11-26 MED ORDER — TICAGRELOR 90 MG PO TABS
90.0000 mg | ORAL_TABLET | Freq: Two times a day (BID) | ORAL | Status: DC
  Administered 2021-11-27: 90 mg via ORAL
  Filled 2021-11-26: qty 1

## 2021-11-26 MED ORDER — HEPARIN SODIUM (PORCINE) 1000 UNIT/ML IJ SOLN
INTRAMUSCULAR | Status: DC | PRN
  Administered 2021-11-26: 5000 [IU] via INTRAVENOUS
  Administered 2021-11-26: 1000 [IU] via INTRAVENOUS

## 2021-11-26 MED ORDER — ONDANSETRON HCL 4 MG/2ML IJ SOLN
INTRAMUSCULAR | Status: DC | PRN
  Administered 2021-11-26: 4 mg via INTRAVENOUS

## 2021-11-26 MED ORDER — ATORVASTATIN CALCIUM 40 MG PO TABS
40.0000 mg | ORAL_TABLET | Freq: Every day | ORAL | Status: DC
  Administered 2021-11-26: 40 mg via ORAL
  Filled 2021-11-26: qty 1

## 2021-11-26 NOTE — Progress Notes (Signed)
Pt requesting antacid home medication.  Lansoprazole 30 mg at bedtime listed in PTA meds.  RN reached out to neurosurgery NP, Weston Brass, to ask if an order can be placed for Lansoprazole.  VO given to order Lansoprazole 30 mg.  Order being placed, prompted RN to change Lansoprazole to Pantoprazole (Protonix) 40 mg.  RN called pharmacy to clarify.  Medications are the same.  Pt already had Protonix 40 mg scheduled.  Pt did not receive dose today.  RN rescheduled Protonix to include now and 2200 for the days to follow to corollate with pts bedtime.

## 2021-11-26 NOTE — Brief Op Note (Signed)
  NEUROSURGERY BRIEF OPERATIVE  NOTE   PREOP DX: Cerebral aneurysm  POSTOP DX: Same  PROCEDURE: Stent-supported coil embolization of right ACA aneurysm  SURGEON: Dr. Consuella Lose, MD  ANESTHESIA: GETA  APPROACH: Right trans-femoral  EBL: Minimal  SPECIMENS: None  COMPLICATIONS: None  CONDITION: Stable to recovery  FINDINGS (Full report in [facility]): 1. Successful stent-supported coil embolization of distal right ACA aneurysm   Consuella Lose, MD [facility]

## 2021-11-26 NOTE — Consult Note (Signed)
Reason for Consult:Left shoulder pain Referring Physician: Consuella Lose Time called: [id number] Time at bedside: [place] is an 71 y.o. female.  HPI: Amber Reeves underwent cerebral aneurysm clipping today. When she woke up from surgery she c/o significant left shoulder pain. She denies any pain prior to surgery. She relates frequent dislocations as a child, an elbow fx about 6y ago, and some occasional pain with arthritis since but nothing like this. She denies any antecedent event.  Past Medical History:  Diagnosis Date   Allergy    Anxiety    Arthritis    Asthma    Coronary artery disease    Depression    Diabetes mellitus without complication ([place])    Dyspnea    GERD (gastroesophageal reflux disease)    Hyperlipidemia    Hypertension    Pneumonia     Past Surgical History:  Procedure Laterality Date   ABDOMINAL HYSTERECTOMY     BREAST EXCISIONAL BIOPSY     CESAREAN SECTION     3   CHOLECYSTECTOMY  2018   CORONARY STENT PLACEMENT  10/2017   [facility] hospital)   IR 3D INDEPENDENT WKST  11/26/2021   IR ANGIO INTRA EXTRACRAN SEL COM CAROTID INNOMINATE UNI R MOD SED  10/21/2021   IR ANGIO INTRA EXTRACRAN SEL INTERNAL CAROTID UNI L MOD SED  10/21/2021   IR ANGIO INTRA EXTRACRAN SEL INTERNAL CAROTID UNI R MOD SED  11/26/2021   IR ANGIO VERTEBRAL SEL VERTEBRAL UNI L MOD SED  10/21/2021   IR ANGIOGRAM FOLLOW UP STUDY  11/26/2021   IR ANGIOGRAM FOLLOW UP STUDY  11/26/2021   IR ANGIOGRAM FOLLOW UP STUDY  11/26/2021   IR TRANSCATH/EMBOLIZ  11/26/2021   LEFT HEART CATH AND CORONARY ANGIOGRAPHY N/A 02/25/2019   Procedure: LEFT HEART CATH AND CORONARY ANGIOGRAPHY;  Surgeon: Nelva Bush, MD;  [facility];  Service: Cardiovascular;  Laterality: N/A;   REPLACEMENT TOTAL KNEE BILATERAL Bilateral    done in [place] (2003/2005)   RIGHT/LEFT HEART CATH AND CORONARY ANGIOGRAPHY N/A 09/22/2020   Procedure: RIGHT/LEFT HEART CATH AND CORONARY ANGIOGRAPHY;   Surgeon: Leonie Man, MD;  Location: [place] [facility];  Service: Cardiovascular;  Laterality: N/A;   TUBAL LIGATION      Family History  Problem Relation Age of Onset   Heart disease Mother    Stroke Mother    Hypertension Mother    Cancer Father    Stroke Father     Social History:  reports that she has never smoked. She has never used smokeless tobacco. She reports that she does not drink alcohol and does not use drugs.  Allergies:  Allergies  Allergen Reactions   Aspartame Rash   Penicillins Itching and Rash    Did it involve swelling of the face/tongue/throat, SOB, or low BP? No Did it involve sudden or severe rash/hives, skin peeling, or any reaction on the inside of your mouth or nose? No Did you need to seek medical attention at a hospital or doctor's office? No When did it last happen?~25 years ago       If all above answers are "NO", may proceed with cephalosporin use.    Wilder Glade [Dapagliflozin]     UTIs   Metformin And Related     GI distress, dizziness   No [facility] Food Allergies     Honey-Rash Artificial Sweeteners-rash   Paxil [Paroxetine Hcl] Other (See Comments)    Twitching/feels like skin crawling  Prozac [Fluoxetine Hcl] Other (See Comments)    Twitching/feels like skin crawling   Zoloft [Sertraline Hcl] Other (See Comments)    Twitching/feels like skin crawling   Prednisone Palpitations   Ranexa [Ranolazine] Palpitations    Heart racing    Medications: I have reviewed the patient's current medications.  Results for orders placed or performed during the hospital encounter of 11/26/21 (from the past 48 hour(s))  Glucose, capillary     Status: Abnormal   Collection Time: 11/26/21  6:37 AM  Result Value Ref Range   Glucose-Capillary 117 (H) 70 - 99 mg/dL    Comment: Glucose reference range applies only to samples taken after fasting for at least 8 hours.  Type and screen [place]     Status: None   Collection Time:  11/26/21  9:14 AM  Result Value Ref Range   ABO/RH(D) A NEG    Antibody Screen POS    Sample Expiration      11/29/2021,2359 Performed at [facility], [place] [address], [place], [place] [address]     DG Shoulder 1V Left  Result Date: 11/26/2021 CLINICAL DATA:  Pain and swelling of left shoulder EXAM: LEFT SHOULDER COMPARISON:  None Available. FINDINGS: There is no evidence of fracture or dislocation, although patient positioning somewhat limits evaluation. Mild degenerative changes of the acromioclavicular joint. Soft tissues are unremarkable. IMPRESSION: No acute osseous abnormality. Electronically Signed   By: Yetta Glassman M.D.   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 She previously underwent diagnostic cerebral angiogram further delineating the aneurysms. After a lengthy discussion of treatment options in the office, the patient elected to proceed with attempt at endovascular treatment with possible stent placement. Patient has been pretreated with dual antiplatelet therapy to include 81 mg aspirin daily and Brilinta 90 mg twice a day for the last 7 days. ACCESS: The technical aspects of the procedure as well as its potential risks and benefits were reviewed with the patient and her husband. These risks included but were not limited to stroke, intracranial hemorrhage, bleeding, infection, allergic reaction, damage to organs or vital structures, stroke, non-diagnostic procedure, and the catastrophic outcomes of heart attack, coma, and death. With an understanding of these risks, informed consent was obtained and witnessed. The patient was placed in the supine position on the angiography table and the skin of right groin prepped in the usual sterile fashion. The procedure  was performed under general anesthesia. A 8-French sheath was introduced in the right common femoral artery using Seldinger technique. MEDICATIONS: HEPARIN: 6000 Units total. CONTRAST:  cc, Omnipaque 300 FLUOROSCOPY TIME:  FLUOROSCOPY TIME: See IR records TECHNIQUE: CATHETERS AND WIRES 180 cm 0.035" glidewire 6-French NeuronMax guide sheath 6-French Berenstein Select JB-1 catheter 5-French Simmons-2 Select catheter 0.058" CatV guidecatheter Excelsior XT-27 microcatheter Synchro 2 select exchange microwire Synchro 2 select standard microwire Excelsior XT-17 microcatheter COILS/STENTS USED Neuroform Atlas 3.0 x 49m Target XL 360 soft 4 mm x 8 cm Target XL 360 soft 2 mm x 6 cm Target Nano 1.5 cm x 3 cm VESSELS CATHETERIZED Right internal carotid Right anterior cerebral Right common femoral VESSELS STUDIED Right internal carotid artery, head (pre embolization) Right internal carotid artery, 3D rotational angiogram Right internal carotid artery, head (during embolization) Right internal carotid artery, head (immediate post-embolization) Right internal carotid artery,  head (final control) Right common femoral PROCEDURAL NARRATIVE Under real-time fluoroscopy, the guide sheath was advanced over the select catheter and glidewire into the descending aorta. Attempt was made to select the innominate artery unsuccessfully. I therefore removed the Berenstein catheter and introduce the [facility] 2 catheter. Secondary curve was reformed in the left subclavian. The proximal right common carotid artery was then selected. The guidewire was advanced into the right internal carotid artery under roadmap guidance. This allowed advancement of the guide sheath into the proximal cervical right internal carotid artery. The [facility] 2 catheter was then removed. The 058 guide catheter was coaxially introduced over the microcatheter and microwire. The 27 microcatheter was then advanced under roadmap guidance into the A3 segment of the right  anterior cerebral artery. Attempt was made to advance the guide catheter into the right A1, unfortunately length of the catheter precluded this distal advancement. I therefore left the guide catheter in the supraclinoid right ICA. Three-dimensional rotational angiogram was taken. The 27 catheter was removed and the 17 catheter was introduced over the microwire. The distal A3 A4 segment was then selected and the 17 catheter was advanced past the aneurysm into the right-sided A4 branch. The appropriate stent was then sized, and deployed from the A4 branch into the aneurysms parent vessel in order to protect the bifurcation. The 17 catheter was advanced over the pushing wire into the lumen of the stent. Angiogram was then taken after stent deployment. Pusher wire was then removed, and the microwire was introduced. The aneurysm was then accessed through the stent. The above coils were then sequentially deployed with intermittent angiograms taken to confirm coil location and patency of relevant vessels. After deployment of the last coil, the 17 catheter was removed. Final angiogram was taken. The guide catheter was then withdrawn down into the cervical internal carotid artery and final control angiogram was taken. The guide catheter and guide sheath were then synchronously removed without incident. FINDINGS: Right internal carotid, head: Injection reveals the presence of a widely patent ICA, M1, and A1 segments and their branches. Again demonstrated is the proximally 5-6 mm aneurysm arising at the A3 A4 junction. Morphology of the aneurysm as well as its association with the A3 bifurcation is better delineated on the three-dimensional angiogram. The parenchymal and venous phases are normal. The venous sinuses are widely patent. Right internal carotid, 3D rotation 3-dimensional rotational angiographic images were reconstructed on an independent workstation for review. These further delineate the above-described A3  bifurcation aneurysms. Of note, the aneurysm does appear to incorporate both ACA branches. Right internal carotid artery, head (during embolization): Injection reveals the presence of a widely patent ICA that leads to a patent ACA and MCA. Angiogram taken after stent deployment appears to demonstrate good position of the stent extending from the A3 into the more distal right A4 segment. The distal ACA segments remain widely patent without any filling defects. Angiograms taken during subsequent coiling also revealed good placement of the coils, without herniation into the parent vessels. No filling defects are noted. Right internal carotid artery, head (immediate post-embolization): Injection reveals the presence of a widely patent ICA that leads to a patent ACA and MCA. Coil mass within the aneurysm is stable, without coil prolapse or filling defect to suggest thrombus. Stent remains in stable position. Dome of the aneurysm appears to be occluded, with some filling of the dysplastic neck which incorporates the origin of the A4 segments. Right internal carotid artery, head (final control): Injection reveals the presence of  a widely patent ICA that leads to a patent ACA and MCA. No thrombus is visualized. Coil mass is seen within the aneurysm and is in stable position. There is some contrast stasis within the aneurysm. The parenchymal and venous phases are unremarkable. Right femoral: Normal vessel. No significant atherosclerotic disease. Arterial sheath in adequate position. DISPOSITION: Upon completion of the study, the femoral sheath was removed and hemostasis obtained using a 7-Fr ExoSeal closure device. Good proximal and distal lower extremity pulses were documented upon achievement of hemostasis. The procedure was well tolerated and no early complications were observed. The patient was transferred to the postanesthesia care unit in stable hemodynamic condition. IMPRESSION: 1. Successful stent supported coil  embolization of a distal right anterior cerebral artery aneurysms. The preliminary results of this procedure were shared with the patient's family. 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 She previously underwent diagnostic cerebral angiogram further delineating the aneurysms. After a lengthy discussion of treatment options in the office, the patient elected to proceed with attempt at endovascular treatment with possible stent placement. Patient has been pretreated with dual antiplatelet therapy to include 81 mg aspirin daily and Brilinta 90 mg twice a day for the last 7 days. ACCESS: The technical aspects of the procedure as well as its potential risks and benefits were reviewed with the patient and her husband. These risks included but were not limited to stroke, intracranial hemorrhage, bleeding, infection, allergic reaction, damage to organs or vital structures, stroke, non-diagnostic procedure, and the catastrophic outcomes of heart attack, coma, and death. With an understanding of these risks, informed consent was obtained and witnessed. The patient was placed in the supine position on the angiography table and the skin of right groin prepped in the usual sterile fashion. The procedure was performed under general anesthesia. A 8-French sheath was introduced in the right common femoral artery using Seldinger technique. MEDICATIONS: HEPARIN: 6000 Units total. CONTRAST:  cc, Omnipaque 300 FLUOROSCOPY TIME:  FLUOROSCOPY TIME: See IR records TECHNIQUE: CATHETERS AND WIRES 180 cm 0.035" glidewire 6-French NeuronMax guide sheath 6-French Berenstein Select JB-1 catheter 5-French Simmons-2 Select catheter  0.058" CatV guidecatheter Excelsior XT-27 microcatheter Synchro 2 select exchange microwire Synchro 2 select standard microwire Excelsior XT-17 microcatheter COILS/STENTS USED Neuroform Atlas 3.0 x 40m Target XL 360 soft 4 mm x 8 cm Target XL 360 soft 2 mm x 6 cm Target Nano 1.5 cm x 3 cm VESSELS CATHETERIZED Right internal carotid Right anterior cerebral Right common femoral VESSELS STUDIED Right internal carotid artery, head (pre embolization) Right internal carotid artery, 3D rotational angiogram Right internal carotid artery, head (during embolization) Right internal carotid artery, head (immediate post-embolization) Right internal carotid artery, head (final control) Right common femoral PROCEDURAL NARRATIVE Under real-time fluoroscopy, the guide sheath was advanced over the select catheter and glidewire into the descending aorta. Attempt was made to select the innominate artery unsuccessfully. I therefore removed the Berenstein catheter and introduce the [facility]. Secondary curve was reformed in the left subclavian. The proximal right common carotid artery was then selected. The guidewire was advanced into the right internal carotid artery under roadmap guidance. This allowed advancement of the guide sheath into the proximal cervical right internal carotid artery. The [employer]  2 catheter was then removed. The 058 guide catheter was coaxially introduced over the microcatheter and microwire. The 27 microcatheter was then advanced under roadmap guidance into the A3 segment of the right anterior cerebral artery. Attempt was made to advance the guide catheter into the right A1, unfortunately length of the catheter precluded this distal advancement. I therefore left the guide catheter in the supraclinoid right ICA. Three-dimensional rotational angiogram was taken. The 27 catheter was removed and the 17 catheter was introduced over the microwire. The distal A3 A4 segment was then selected and the 17  catheter was advanced past the aneurysm into the right-sided A4 branch. The appropriate stent was then sized, and deployed from the A4 branch into the aneurysms parent vessel in order to protect the bifurcation. The 17 catheter was advanced over the pushing wire into the lumen of the stent. Angiogram was then taken after stent deployment. Pusher wire was then removed, and the microwire was introduced. The aneurysm was then accessed through the stent. The above coils were then sequentially deployed with intermittent angiograms taken to confirm coil location and patency of relevant vessels. After deployment of the last coil, the 17 catheter was removed. Final angiogram was taken. The guide catheter was then withdrawn down into the cervical internal carotid artery and final control angiogram was taken. The guide catheter and guide sheath were then synchronously removed without incident. FINDINGS: Right internal carotid, head: Injection reveals the presence of a widely patent ICA, M1, and A1 segments and their branches. Again demonstrated is the proximally 5-6 mm aneurysm arising at the A3 A4 junction. Morphology of the aneurysm as well as its association with the A3 bifurcation is better delineated on the three-dimensional angiogram. The parenchymal and venous phases are normal. The venous sinuses are widely patent. Right internal carotid, 3D rotation 3-dimensional rotational angiographic images were reconstructed on an independent workstation for review. 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 Of note, the aneurysm does appear to incorporate both ACA branches. Right internal carotid artery, head (during embolization): Injection reveals the presence of a widely patent ICA that leads to a patent ACA and MCA. Angiogram taken after stent deployment appears to demonstrate good position of the stent extending from the A3 into the more distal right A4 segment. The distal ACA segments remain  widely patent without any filling defects. Angiograms taken during subsequent coiling also revealed good placement of the coils, without herniation into the parent vessels. No filling defects are noted. Right internal carotid artery, head (immediate post-embolization): Injection reveals the presence of a widely patent ICA that leads to a patent ACA and MCA. Coil mass within the aneurysm is stable, without coil prolapse or filling defect to suggest thrombus. Stent remains in stable position. Dome of the aneurysm appears to be occluded, with some filling of the dysplastic neck which incorporates the origin of the A4 segments. Right internal carotid artery, head (final control): Injection reveals the presence of a widely patent ICA that leads to a patent ACA and MCA. No thrombus is visualized. Coil mass is seen within the aneurysm and is in stable position. There is some contrast stasis within the aneurysm. The parenchymal and venous phases are unremarkable. Right femoral: Normal vessel. No significant atherosclerotic disease. Arterial sheath in adequate position. DISPOSITION: Upon completion of the study, the femoral sheath was removed and hemostasis obtained using a 7-Fr ExoSeal closure device. Good proximal and distal lower extremity pulses were documented upon achievement of hemostasis. The procedure was well tolerated  and no early complications were observed. The patient was transferred to the postanesthesia care unit in stable hemodynamic condition. IMPRESSION: 1. 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 The preliminary results of this procedure were shared with the patient's family. Electronically Signed   By: Consuella Lose   On: 11/26/2021 11:15   IR Angiogram Follow Up Study  Result Date: 11/26/2021 PROCEDURE: COIL EMBOLIZATION OF RIGHT ANTERIOR CEREBRAL ARTERY ANEURYSM HISTORY: The patient is a 71 year old woman with incidentally discovered  distal right anterior cerebral artery aneurysm. She previously underwent diagnostic cerebral angiogram further delineating the aneurysms. After a lengthy discussion of treatment options in the office, the patient elected to proceed with attempt at endovascular treatment with possible stent placement. Patient has been pretreated with dual antiplatelet therapy to include 81 mg aspirin daily and Brilinta 90 mg twice a day for the last 7 days. ACCESS: The technical aspects of the procedure as well as its potential risks and benefits were reviewed with the patient and her husband. These risks included but were not limited to stroke, intracranial hemorrhage, bleeding, infection, allergic reaction, damage to organs or vital structures, stroke, non-diagnostic procedure, and the catastrophic outcomes of heart attack, coma, and death. With an understanding of these risks, informed consent was obtained and witnessed. The patient was placed in the supine position on the angiography table and the skin of right groin prepped in the usual sterile fashion. The procedure was performed under general anesthesia. A 8-French sheath was introduced in the right common femoral artery using Seldinger technique. MEDICATIONS: HEPARIN: 6000 Units total. CONTRAST:  cc, Omnipaque 300 FLUOROSCOPY TIME:  FLUOROSCOPY TIME: See IR records TECHNIQUE: CATHETERS AND WIRES 180 cm 0.035" glidewire 6-French NeuronMax guide sheath 6-French Berenstein Select JB-1 catheter 5-French Simmons-2 Select catheter 0.058" CatV guidecatheter Excelsior XT-27 microcatheter Synchro 2 select exchange microwire Synchro 2 select standard microwire Excelsior XT-17 microcatheter COILS/STENTS USED Neuroform Atlas 3.0 x 4m Target XL 360 soft 4 mm x 8 cm Target XL 360 soft 2 mm x 6 cm Target Nano 1.5 cm x 3 cm VESSELS CATHETERIZED Right internal carotid Right anterior cerebral Right common femoral VESSELS STUDIED Right internal carotid artery, head (pre embolization) Right  internal carotid artery, 3D rotational angiogram Right internal carotid artery, head (during embolization) Right internal carotid artery, head (immediate post-embolization) Right internal carotid artery, head (final control) Right common femoral PROCEDURAL NARRATIVE Under real-time fluoroscopy, the guide sheath was advanced over the select catheter and glidewire into the descending aorta. Attempt was made to select the innominate artery unsuccessfully. I therefore removed the Berenstein catheter and introduce the [facility] catheter. Secondary curve was reformed in the left subclavian. The proximal right common carotid artery was then selected. The guidewire was advanced into the right internal carotid artery under roadmap guidance. This allowed advancement of the guide sheath into the proximal cervical right internal carotid artery. The [facility] catheter was then removed. The 058 guide catheter was coaxially introduced over the microcatheter and microwire. The 27 microcatheter was then advanced under roadmap guidance into the A3 segment of the right anterior cerebral artery. Attempt was made to advance the guide catheter into the right A1, unfortunately length of the catheter precluded this distal advancement. I therefore left the guide catheter in the supraclinoid right ICA. Three-dimensional rotational angiogram was taken. The 27 catheter was removed and the 17 catheter was introduced over the microwire. The distal A3 A4 segment was then selected and the 17 catheter was  advanced past the aneurysm into the right-sided A4 branch. The appropriate stent was then sized, and deployed from the A4 branch into the aneurysms parent vessel in order to protect the bifurcation. The 17 catheter was advanced over the pushing wire into the lumen of the stent. Angiogram was then taken after stent deployment. Pusher wire was then removed, and the microwire was introduced. The aneurysm was then accessed through the stent. The above  coils were then sequentially deployed with intermittent angiograms taken to confirm coil location and patency of relevant vessels. After deployment of the last coil, the 17 catheter was removed. Final angiogram was taken. The guide catheter was then withdrawn down into the cervical internal carotid artery and final control angiogram was taken. The guide catheter and guide sheath were then synchronously removed without incident. FINDINGS: Right internal carotid, head: Injection reveals the presence of a widely patent ICA, M1, and A1 segments and their branches. Again demonstrated is the proximally 5-6 mm aneurysm arising at the A3 A4 junction. Morphology of the aneurysm as well as its association with the A3 bifurcation is better delineated on the three-dimensional angiogram. The parenchymal and venous phases are normal. The venous sinuses are widely patent. Right internal carotid, 3D rotation 3-dimensional rotational angiographic images were reconstructed on an independent workstation for review. 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 Of note, the aneurysm does appear to incorporate both ACA branches. Right internal carotid artery, head (during embolization): Injection reveals the presence of a widely patent ICA that leads to a patent ACA and MCA. Angiogram taken after stent deployment appears to demonstrate good position of the stent extending from the A3 into the more distal right A4 segment. The distal ACA segments remain widely patent without any filling defects. Angiograms taken during subsequent coiling also revealed good placement of the coils, without herniation into the parent vessels. No filling defects are noted. Right internal carotid artery, head (immediate post-embolization): Injection reveals the presence of a widely patent ICA that leads to a patent ACA and MCA. Coil mass within the aneurysm is stable, without coil prolapse or filling defect to suggest thrombus. Stent  remains in stable position. Dome of the aneurysm appears to be occluded, with some filling of the dysplastic neck which incorporates the origin of the A4 segments. Right internal carotid artery, head (final control): Injection reveals the presence of a widely patent ICA that leads to a patent ACA and MCA. No thrombus is visualized. Coil mass is seen within the aneurysm and is in stable position. There is some contrast stasis within the aneurysm. The parenchymal and venous phases are unremarkable. Right femoral: Normal vessel. No significant atherosclerotic disease. Arterial sheath in adequate position. DISPOSITION: Upon completion of the study, the femoral sheath was removed and hemostasis obtained using a 7-Fr ExoSeal closure device. Good proximal and distal lower extremity pulses were documented upon achievement of hemostasis. The procedure was well tolerated and no early complications were observed. The patient was transferred to the postanesthesia care unit in stable hemodynamic condition. IMPRESSION: 1. 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 The preliminary results of this procedure were shared with the patient's family. 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 She previously underwent diagnostic cerebral  angiogram further delineating the aneurysms. After a lengthy discussion of treatment options in the office, the patient elected to proceed with attempt at endovascular treatment with possible stent placement. Patient has been pretreated with dual antiplatelet therapy to include 81 mg aspirin daily and Brilinta 90 mg twice a day for the last 7 days. ACCESS: The technical  aspects of the procedure as well as its potential risks and benefits were reviewed with the patient and her husband. These risks included but were not limited to stroke, intracranial hemorrhage, bleeding, infection, allergic reaction, damage to organs or vital structures, stroke, non-diagnostic procedure, and the catastrophic outcomes of heart attack, coma, and death. With an understanding of these risks, informed consent was obtained and witnessed. The patient was placed in the supine position on the angiography table and the skin of right groin prepped in the usual sterile fashion. The procedure was performed under general anesthesia. A 8-French sheath was introduced in the right common femoral artery using Seldinger technique. MEDICATIONS: HEPARIN: 6000 Units total. CONTRAST:  cc, Omnipaque 300 FLUOROSCOPY TIME:  FLUOROSCOPY TIME: See IR records TECHNIQUE: CATHETERS AND WIRES 180 cm 0.035" glidewire 6-French NeuronMax guide sheath 6-French Berenstein Select JB-1 catheter 5-French Simmons-2 Select catheter 0.058" CatV guidecatheter Excelsior XT-27 microcatheter Synchro 2 select exchange microwire Synchro 2 select standard microwire Excelsior XT-17 microcatheter COILS/STENTS USED Neuroform Atlas 3.0 x 64m Target XL 360 soft 4 mm x 8 cm Target XL 360 soft 2 mm x 6 cm Target Nano 1.5 cm x 3 cm VESSELS CATHETERIZED Right internal carotid Right anterior cerebral Right common femoral VESSELS STUDIED Right internal carotid artery, head (pre embolization) Right internal carotid artery, 3D rotational angiogram Right internal carotid artery, head (during embolization) Right internal carotid artery, head (immediate post-embolization) Right internal carotid artery, head (final control) Right common femoral PROCEDURAL NARRATIVE Under real-time fluoroscopy, the guide sheath was advanced over the select catheter and glidewire into the descending aorta. Attempt was made to select the innominate artery unsuccessfully. I therefore  removed the Berenstein catheter and [facility] the [facility]. Secondary curve was reformed in the left subclavian. The proximal right common carotid artery was then selected. The guidewire was advanced into the right internal carotid artery under roadmap guidance. This allowed advancement of the guide sheath into the proximal cervical right internal carotid artery. The [facility] catheter was then removed. The 058 guide catheter was coaxially introduced over the microcatheter and microwire. The 27 microcatheter was then advanced under roadmap guidance into the A3 segment of the right anterior cerebral artery. Attempt was made to advance the guide catheter into the right A1, unfortunately length of the catheter precluded this distal advancement. I therefore left the guide catheter in the supraclinoid right ICA. Three-dimensional rotational angiogram was taken. The 27 catheter was removed and the 17 catheter was introduced over the microwire. The distal A3 A4 segment was then selected and the 17 catheter was advanced past the aneurysm into the right-sided A4 branch. The appropriate stent was then sized, and deployed from the A4 branch into the aneurysms parent vessel in order to protect the bifurcation. The 17 catheter was advanced over the pushing wire into the lumen of the stent. Angiogram was then taken after stent deployment. Pusher wire was then removed, and the microwire was introduced. The aneurysm was then accessed through the stent. The above coils were then sequentially deployed with intermittent angiograms taken to confirm coil location and patency of relevant vessels. After deployment of the last coil, the 17  catheter was removed. Final angiogram was taken. The guide catheter was then withdrawn down into the cervical internal carotid artery and final control angiogram was taken. The guide catheter and guide sheath were then synchronously removed without incident. FINDINGS: Right internal carotid,  head: Injection reveals the presence of a widely patent ICA, M1, and A1 segments and their branches. Again demonstrated is the proximally 5-6 mm aneurysm arising at the A3 A4 junction. Morphology of the aneurysm as well as its association with the A3 bifurcation is better delineated on the three-dimensional angiogram. The parenchymal and venous phases are normal. The venous sinuses are widely patent. Right internal carotid, 3D rotation 3-dimensional rotational angiographic images were reconstructed on an independent workstation for review. 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 Of note, the aneurysm does appear to incorporate both ACA branches. Right internal carotid artery, head (during embolization): Injection reveals the presence of a widely patent ICA that leads to a patent ACA and MCA. Angiogram taken after stent deployment appears to demonstrate good position of the stent extending from the A3 into the more distal right A4 segment. The distal ACA segments remain widely patent without any filling defects. Angiograms taken during subsequent coiling also revealed good placement of the coils, without herniation into the parent vessels. No filling defects are noted. Right internal carotid artery, head (immediate post-embolization): Injection reveals the presence of a widely patent ICA that leads to a patent ACA and MCA. Coil mass within the aneurysm is stable, without coil prolapse or filling defect to suggest thrombus. Stent remains in stable position. Dome of the aneurysm appears to be occluded, with some filling of the dysplastic neck which incorporates the origin of the A4 segments. Right internal carotid artery, head (final control): Injection reveals the presence of a widely patent ICA that leads to a patent ACA and MCA. No thrombus is visualized. Coil mass is seen within the aneurysm and is in stable position. There is some contrast stasis within the aneurysm. The parenchymal  and venous phases are unremarkable. Right femoral: Normal vessel. No significant atherosclerotic disease. Arterial sheath in adequate position. DISPOSITION: Upon completion of the study, the femoral sheath was removed and hemostasis obtained using a 7-Fr ExoSeal closure device. Good proximal and distal lower extremity pulses were documented upon achievement of hemostasis. The procedure was well tolerated and no early complications were observed. The patient was transferred to the postanesthesia care unit in stable hemodynamic condition. IMPRESSION: 1. 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 The preliminary results of this procedure were shared with the patient's family. 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 She previously underwent diagnostic cerebral angiogram further delineating the aneurysms. After a lengthy discussion of treatment options in the office, the patient elected to proceed with attempt at endovascular treatment with possible stent placement. Patient has been pretreated with dual antiplatelet therapy to include 81 mg aspirin daily and Brilinta 90 mg twice a day for the last 7 days. ACCESS: The technical aspects of the procedure as well as its potential risks and benefits were reviewed with the patient and her husband. These risks included but were not limited to stroke, intracranial hemorrhage, bleeding, infection, allergic reaction, damage to organs or vital structures, stroke,  non-diagnostic procedure, and the catastrophic outcomes of heart attack, coma, and death. With an understanding of these risks, informed consent was obtained and witnessed. The patient was  placed in the supine position on the angiography table and the skin of right groin prepped in the usual sterile fashion. The procedure was performed under general anesthesia. A 8-French sheath was introduced in the right common femoral artery using Seldinger technique. MEDICATIONS: HEPARIN: 6000 Units total. CONTRAST:  cc, Omnipaque 300 FLUOROSCOPY TIME:  FLUOROSCOPY TIME: See IR records TECHNIQUE: CATHETERS AND WIRES 180 cm 0.035" glidewire 6-French NeuronMax guide sheath 6-French Berenstein Select JB-1 catheter 5-French Simmons-2 Select catheter 0.058" CatV guidecatheter Excelsior XT-27 microcatheter Synchro 2 select exchange microwire Synchro 2 select standard microwire Excelsior XT-17 microcatheter COILS/STENTS USED Neuroform Atlas 3.0 x 37m Target XL 360 soft 4 mm x 8 cm Target XL 360 soft 2 mm x 6 cm Target Nano 1.5 cm x 3 cm VESSELS CATHETERIZED Right internal carotid Right anterior cerebral Right common femoral VESSELS STUDIED Right internal carotid artery, head (pre embolization) Right internal carotid artery, 3D rotational angiogram Right internal carotid artery, head (during embolization) Right internal carotid artery, head (immediate post-embolization) Right internal carotid artery, head (final control) Right common femoral PROCEDURAL NARRATIVE Under real-time fluoroscopy, the guide sheath was advanced over the select catheter and glidewire into the descending aorta. Attempt was made to select the innominate artery unsuccessfully. I therefore removed the Berenstein catheter and introduce the [facility]. Secondary curve was reformed in the left subclavian. The proximal right common carotid artery was then selected. The guidewire was advanced into the right internal carotid artery under roadmap guidance. This allowed advancement of the guide sheath into the proximal cervical right internal carotid artery. The [facility] catheter was then removed. The 058 guide catheter was coaxially introduced over  the microcatheter and microwire. The 27 microcatheter was then advanced under roadmap guidance into the A3 segment of the right anterior cerebral artery. Attempt was made to advance the guide catheter into the right A1, unfortunately length of the catheter precluded this distal advancement. I therefore left the guide catheter in the supraclinoid right ICA. Three-dimensional rotational angiogram was taken. The 27 catheter was removed and the 17 catheter was introduced over the microwire. The distal A3 A4 segment was then selected and the 17 catheter was advanced past the aneurysm into the right-sided A4 branch. The appropriate stent was then sized, and deployed from the A4 branch into the aneurysms parent vessel in order to protect the bifurcation. The 17 catheter was advanced over the pushing wire into the lumen of the stent. Angiogram was then taken after stent deployment. Pusher wire was then removed, and the microwire was introduced. The aneurysm was then accessed through the stent. The above coils were then sequentially deployed with intermittent angiograms taken to confirm coil location and patency of relevant vessels. After deployment of the last coil, the 17 catheter was removed. Final angiogram was taken. The guide catheter was then withdrawn down into the cervical internal carotid artery and final control angiogram was taken. The guide catheter and guide sheath were then synchronously removed without incident. FINDINGS: Right internal carotid, head: Injection reveals the presence of a widely patent ICA, M1, and A1 segments and their branches. Again demonstrated is the proximally 5-6 mm aneurysm arising at the A3 A4 junction. Morphology of the aneurysm as well as its association with the A3 bifurcation is better delineated on the three-dimensional angiogram. The parenchymal and venous phases are normal.  The venous sinuses are widely patent. Right internal carotid, 3D rotation 3-dimensional rotational  angiographic images were reconstructed on an independent workstation for review. 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 Of note, the aneurysm does appear to incorporate both ACA branches. Right internal carotid artery, head (during embolization): Injection reveals the presence of a widely patent ICA that leads to a patent ACA and MCA. Angiogram taken after stent deployment appears to demonstrate good position of the stent extending from the A3 into the more distal right A4 segment. The distal ACA segments remain widely patent without any filling defects. Angiograms taken during subsequent coiling also revealed good placement of the coils, without herniation into the parent vessels. No filling defects are noted. Right internal carotid artery, head (immediate post-embolization): Injection reveals the presence of a widely patent ICA that leads to a patent ACA and MCA. Coil mass within the aneurysm is stable, without coil prolapse or filling defect to suggest thrombus. Stent remains in stable position. Dome of the aneurysm appears to be occluded, with some filling of the dysplastic neck which incorporates the origin of the A4 segments. Right internal carotid artery, head (final control): Injection reveals the presence of a widely patent ICA that leads to a patent ACA and MCA. No thrombus is visualized. Coil mass is seen within the aneurysm and is in stable position. There is some contrast stasis within the aneurysm. The parenchymal and venous phases are unremarkable. Right femoral: Normal vessel. No significant atherosclerotic disease. Arterial sheath in adequate position. DISPOSITION: Upon completion of the study, the femoral sheath was removed and hemostasis obtained using a 7-Fr ExoSeal closure device. Good proximal and distal lower extremity pulses were documented upon achievement of hemostasis. The procedure was well tolerated and no early complications were observed. The patient was  transferred to the postanesthesia care unit in stable hemodynamic condition. IMPRESSION: 1. 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 The preliminary results of this procedure were shared with the patient's family. 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 She previously underwent diagnostic cerebral angiogram further delineating the aneurysms. After a lengthy discussion of treatment options in the office, the patient elected to proceed with attempt at endovascular treatment with possible stent placement. Patient has been pretreated with dual antiplatelet therapy to include 81 mg aspirin daily and Brilinta 90 mg twice a day for the last 7 days. ACCESS: The technical aspects of the procedure as well as its potential risks and benefits were reviewed with the patient and her husband. These risks included but were not limited to stroke, intracranial hemorrhage, bleeding, infection, allergic reaction, damage to organs or vital structures, stroke, non-diagnostic procedure, and the catastrophic outcomes of heart attack, coma, and death. With an understanding of these risks, informed consent was obtained and witnessed. The patient was placed in the supine position on the angiography table and the skin of right groin prepped in the usual sterile fashion. The procedure was performed under general anesthesia. A 8-French sheath was introduced in the right common femoral artery using Seldinger technique. MEDICATIONS: HEPARIN: 6000 Units total. CONTRAST:  cc, Omnipaque 300 FLUOROSCOPY TIME:  FLUOROSCOPY TIME: See IR records TECHNIQUE: CATHETERS AND WIRES 180 cm 0.035" glidewire 6-French NeuronMax guide  sheath 6-French  Berenstein Select JB-1 catheter 5-French Simmons-2 Select catheter 0.058" CatV guidecatheter Excelsior XT-27 microcatheter Synchro 2 select exchange microwire Synchro 2 select standard microwire Excelsior XT-17 microcatheter COILS/STENTS USED Neuroform Atlas 3.0 x 44m Target XL 360 soft 4 mm x 8 cm Target XL 360 soft 2 mm x 6 cm Target Nano 1.5 cm x 3 cm VESSELS CATHETERIZED Right internal carotid Right anterior cerebral Right common femoral VESSELS STUDIED Right internal carotid artery, head (pre embolization) Right internal carotid artery, 3D rotational angiogram Right internal carotid artery, head (during embolization) Right internal carotid artery, head (immediate post-embolization) Right internal carotid artery, head (final control) Right common femoral PROCEDURAL NARRATIVE Under real-time fluoroscopy, the guide sheath was advanced over the select catheter and glidewire into the descending aorta. Attempt was made to select the innominate artery unsuccessfully. I therefore removed the Berenstein catheter and introduce the [facility] catheter. Secondary curve was reformed in the left subclavian. The proximal right common carotid artery was then selected. The guidewire was advanced into the right internal carotid artery under roadmap guidance. This allowed advancement of the guide sheath into the proximal cervical right internal carotid artery. The [facility] catheter was then removed. The 058 guide catheter was coaxially introduced over the microcatheter and microwire. The 27 microcatheter was then advanced under roadmap guidance into the A3 segment of the right anterior cerebral artery. Attempt was made to advance the guide catheter into the right A1, unfortunately length of the catheter precluded this distal advancement. I therefore left the guide catheter in the supraclinoid right ICA. Three-dimensional rotational angiogram was taken. The 27 catheter was removed and the 17 catheter was  introduced over the microwire. The distal A3 A4 segment was then selected and the 17 catheter was advanced past the aneurysm into the right-sided A4 branch. The appropriate stent was then sized, and deployed from the A4 branch into the aneurysms parent vessel in order to protect the bifurcation. The 17 catheter was advanced over the pushing wire into the lumen of the stent. Angiogram was then taken after stent deployment. Pusher wire was then removed, and the microwire was introduced. The aneurysm was then accessed through the stent. The above coils were then sequentially deployed with intermittent angiograms taken to confirm coil location and patency of relevant vessels. After deployment of the last coil, the 17 catheter was removed. Final angiogram was taken. The guide catheter was then withdrawn down into the cervical internal carotid artery and final control angiogram was taken. The guide catheter and guide sheath were then synchronously removed without incident. FINDINGS: Right internal carotid, head: Injection reveals the presence of a widely patent ICA, M1, and A1 segments and their branches. Again demonstrated is the proximally 5-6 mm aneurysm arising at the A3 A4 junction. Morphology of the aneurysm as well as its association with the A3 bifurcation is better delineated on the three-dimensional angiogram. The parenchymal and venous phases are normal. The venous sinuses are widely patent. Right internal carotid, 3D rotation 3-dimensional rotational angiographic images were reconstructed on an independent workstation for review. 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 Of note, the aneurysm does appear to incorporate both ACA branches. Right internal carotid artery, head (during embolization): Injection reveals the presence of a widely patent ICA that leads to a patent ACA and MCA. Angiogram taken after stent deployment appears to demonstrate good position of the stent extending  from the A3 into the more distal right A4 segment. The distal ACA segments remain widely patent without any filling  defects. Angiograms taken during subsequent coiling also revealed good placement of the coils, without herniation into the parent vessels. No filling defects are noted. Right internal carotid artery, head (immediate post-embolization): Injection reveals the presence of a widely patent ICA that leads to a patent ACA and MCA. Coil mass within the aneurysm is stable, without coil prolapse or filling defect to suggest thrombus. Stent remains in stable position. Dome of the aneurysm appears to be occluded, with some filling of the dysplastic neck which incorporates the origin of the A4 segments. Right internal carotid artery, head (final control): Injection reveals the presence of a widely patent ICA that leads to a patent ACA and MCA. No thrombus is visualized. Coil mass is seen within the aneurysm and is in stable position. There is some contrast stasis within the aneurysm. The parenchymal and venous phases are unremarkable. Right femoral: Normal vessel. No significant atherosclerotic disease. Arterial sheath in adequate position. DISPOSITION: Upon completion of the study, the femoral sheath was removed and hemostasis obtained using a 7-Fr ExoSeal closure device. Good proximal and distal lower extremity pulses were documented upon achievement of hemostasis. The procedure was well tolerated and no early complications were observed. The patient was transferred to the postanesthesia care unit in stable hemodynamic condition. IMPRESSION: 1. 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 The preliminary results of this procedure were shared with the patient's family. Electronically Signed   By: Consuella Lose   On: 11/26/2021 11:15    Review of Systems  HENT:  Negative for ear discharge, ear pain, hearing loss and tinnitus.   Eyes:  Negative for  photophobia and pain.  Respiratory:  Negative for cough and shortness of breath.   Cardiovascular:  Negative for chest pain.  Gastrointestinal:  Negative for abdominal pain, nausea and vomiting.  Genitourinary:  Negative for dysuria, flank pain, frequency and urgency.  Musculoskeletal:  Positive for arthralgias (Left shoulder). Negative for back pain, myalgias and neck pain.  Neurological:  Negative for dizziness and headaches.  Hematological:  Does not bruise/bleed easily.  Psychiatric/Behavioral:  The patient is not nervous/anxious.    Blood pressure (!) 125/56, pulse 69, temperature 97.7 F (36.5 C), resp. rate 19, height 5' 3"  (1.6 m), weight 106.1 kg, SpO2 97 %. Physical Exam Constitutional:      General: She is not in acute distress.    Appearance: She is well-developed. She is not diaphoretic.  HENT:     Head: Normocephalic and atraumatic.  Eyes:     General: No scleral icterus.       Right eye: No discharge.        Left eye: No discharge.     Conjunctiva/sclera: Conjunctivae normal.  Cardiovascular:     Rate and Rhythm: Normal rate and regular rhythm.  Pulmonary:     Effort: Pulmonary effort is normal. No respiratory distress.  Musculoskeletal:     Cervical back: Normal range of motion.     Comments: Left shoulder, elbow, wrist, digits- no skin wounds, fullness and small ecchymosis anterior shoulder, mild TTP, full shoulder AROM with minimal to no increased pain, no instability, no blocks to motion  Sens  Ax/R/M/U intact  Mot   Ax/ R/ PIN/ M/ AIN/ U intact  Rad 2+  Skin:    General: Skin is warm and dry.  Neurological:     Mental Status: She is alert.  Psychiatric:        Mood and Affect: Mood normal.  Behavior: Behavior normal.     Assessment/Plan: Left shoulder pain -- Given the excellent and essentially painless ROM I think we can safely assume this is just a soft tissue injury. I suspect some direct trauma during surgery or movement before or after is to  blame. Agree with supportive measures such as ice and/or heat. May take NSAID's if not contraindicated. Could also use Voltaren gel on area. No ROM or WB restrictions. Would expect this to improve quickly over the next few days to week.    Lisette Abu, PA-C Orthopedic Surgery [phone] 11/26/2021, 3:06 PM

## 2021-11-26 NOTE — Progress Notes (Signed)
  Transition of Care [facility]) Screening Note   Patient Details  Name: Amber Reeves Date of Birth: 05-23-1950   Transition of Care [facility]) CM/SW Contact:    Pollie Friar, RN Phone Number: 11/26/2021, 3:41 PM    Transition of Care Department [facility]) has reviewed patient. Pt is from home with spouse. Plan for coil embolization. We will continue to monitor patient advancement through interdisciplinary progression rounds. If new patient transition needs arise, please place a TOC consult.

## 2021-11-26 NOTE — Anesthesia Postprocedure Evaluation (Signed)
Anesthesia Post Note  Patient: Amber Reeves  Procedure(s) Performed: 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     Patient location during evaluation: PACU Anesthesia Type: General Level of consciousness: awake and alert, patient cooperative and oriented Pain management: pain level controlled Vital Signs Assessment: post-procedure vital signs reviewed and stable Respiratory status: spontaneous breathing, nonlabored ventilation, respiratory function stable and patient connected to nasal cannula oxygen Cardiovascular status: blood pressure returned to baseline and stable Postop Assessment: no apparent nausea or vomiting Anesthetic complications: no  No notable events documented.  Last Vitals:  Vitals:   11/26/21 1145 11/26/21 1200  BP: (!) 157/61 (!) 158/61  Pulse: 79 78  Resp: 20 18  Temp:  36.5 C  SpO2: 95% 94%    Last Pain:  Vitals:   11/26/21 1145  TempSrc:   PainSc: 2                  ,E. 

## 2021-11-26 NOTE — Anesthesia Procedure Notes (Signed)
Procedure Name: Intubation Date/Time: 11/26/2021 8:57 AM  Performed by: Dorthea Cove, CRNAPre-anesthesia Checklist: Patient identified, Emergency Drugs available, Suction available and Patient being monitored Patient Re-evaluated:Patient Re-evaluated prior to induction Oxygen Delivery Method: Circle system utilized Preoxygenation: Pre-oxygenation with 100% oxygen Induction Type: IV induction Ventilation: Mask ventilation without difficulty and Oral airway inserted - appropriate to patient size Laryngoscope Size: Mac and 3 Grade View: Grade I Tube type: Oral Tube size: 7.0 mm Number of attempts: 1 Airway Equipment and Method: Stylet and Oral airway Placement Confirmation: ETT inserted through vocal cords under direct vision, positive ETCO2 and breath sounds checked- equal and bilateral Secured at: 21 cm Tube secured with: Tape Dental Injury: Teeth and Oropharynx as per pre-operative assessment

## 2021-11-26 NOTE — Transfer of Care (Signed)
Immediate Anesthesia Transfer of Care Note  Patient: Amber Reeves  Procedure(s) Performed: 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  Patient Location: PACU  Anesthesia Type:General  Level of Consciousness: awake, alert , and oriented  Airway & Oxygen Therapy: Patient Spontanous Breathing and Patient connected to face mask oxygen  Post-op Assessment: Report given to RN and Post -op Vital signs reviewed and stable  Post vital signs: Reviewed and stable  Last Vitals:  Vitals Value Taken Time  BP 159/56 11/26/21 1115  Temp    Pulse 79 11/26/21 1123  Resp 23 11/26/21 1123  SpO2 94 % 11/26/21 1123  Vitals shown include unvalidated device data.  Last Pain:  Vitals:   11/26/21 0652  TempSrc:   PainSc: 8          Complications: No notable events documented.

## 2021-11-26 NOTE — Anesthesia Procedure Notes (Signed)
Arterial Line Insertion Start/End10/07/2021 7:20 AM, 11/26/2021 7:30 AM Performed by: Dorthea Cove, CRNA, CRNA  Patient location: Pre-op. Preanesthetic checklist: patient identified, IV checked, site marked, risks and benefits discussed, surgical consent, monitors and equipment checked, pre-op evaluation, timeout performed and anesthesia consent Lidocaine 1% used for infiltration Left, radial was placed Catheter size: 20 G Hand hygiene performed  and maximum sterile barriers used   Attempts: 1 Procedure performed without using ultrasound guided technique. Following insertion, dressing applied and Biopatch. Post procedure assessment: normal and unchanged

## 2021-11-26 NOTE — H&P (Signed)
Chief Complaint   Aneurysm  History of Present Illness  Amber Reeves is a 70 y.o. female 71 year old woman seen for incidental discovery of intracranial aneurysm.  Briefly, the patient has been complaining of dizziness with associated ringing in the ears and hearing loss on the right side.  She was seen by ENT where MRI was done to evaluate the possibility of tumor.  While now tumor was identified, there was suggestion of possible intracranial aneurysm.  She was therefore referred for neurosurgical evaluation. She underwent diagnostic angiogram and after a lengthy discussion of treatment options, she presents for coil embolization with possible stenting. She has been on 29m ASA and 925mBID Brilinta x 7d  Of note, the patient does have a history of medically controlled hypertension and diabetes.  She does not have any known kidney disease.  There is no family history of intracranial aneurysms.  She is a non-smoker.  Past Medical History   Past Medical History:  Diagnosis Date   Allergy    Anxiety    Arthritis    Asthma    Coronary artery disease    Depression    Diabetes mellitus without complication (HCC)    Dyspnea    GERD (gastroesophageal reflux disease)    Hyperlipidemia    Hypertension    Pneumonia     Past Surgical History   Past Surgical History:  Procedure Laterality Date   ABDOMINAL HYSTERECTOMY     BREAST EXCISIONAL BIOPSY     CESAREAN SECTION     3   CHOLECYSTECTOMY  2018   [facility]ON[facility]   [facility]   IR ANGIO INTRA EXTRACRAN SEL COM CAROTID INNOMINATE UNI R MOD SED  10/21/2021   IR ANGIO INTRA EXTRACRAN SEL INTERNAL CAROTID UNI L MOD SED  10/21/2021   IR ANGIO VERTEBRAL SEL VERTEBRAL UNI L MOD SED  10/21/2021   LEFT HEART CATH AND CORONARY ANGIOGRAPHY N/A 02/25/2019   Procedure: LEFT HEART CATH AND CORONARY ANGIOGRAPHY;  Surgeon: EnNelva BushMD;  [facility];  Service: Cardiovascular;   Laterality: N/A;   REPLACEMENT TOTAL KNEE BILATERAL [place]    [place]   RIGHT/LEFT HEART CATH AND CORONARY ANGIOGRAPHY N/A 09/22/2020   Procedure: RIGHT/LEFT HEART CATH AND CORONARY ANGIOGRAPHY;  Surgeon: HaLeonie ManMD;  [facility];  Service: Cardiovascular;  Laterality: N/A;   TUBAL LIGATION      Social History   Social History   Tobacco Use   Smoking status: Never   Smokeless tobacco: Never  Vaping Use   Vaping Use: Never used  Substance Use Topics   Alcohol use: Never   Drug use: Never    Medications   Prior to Admission medications   Medication Sig Start Date End Date Taking? Authorizing Provider  albuterol (VENTOLIN HFA) 108 (90 Base) MCG/ACT inhaler TAKE 2 PUFFS BY MOUTH EVERY 4 HOURS AS NEEDED FOR WHEEZE 09/16/21  Yes Gottschalk, Ashly M, DO  Alcohol Swabs (B-D SINGLE USE SWABS REGULAR) PADS Apply topically. 07/29/15  Yes [provider]  amLODipine (NORVASC) 10 MG tablet Take 1 tablet (10 mg total) by mouth daily. 03/15/21 09/16/78 Yes Branch, Alphonse GuildMD  aspirin EC 81 MG tablet Take 81 mg by mouth in the morning.   Yes [provider]  atorvastatin (LIPITOR) 40 MG tablet Take 1 tablet (40 mg total) by mouth at bedtime. 09/30/20  Yes Gottschalk, AsLeatrice Jewels, DO  Blood Glucose Calibration (  ACCU-CHEK AVIVA) SOLN 2 Bottles by Other route as needed. 07/29/15  Yes [provider]  Budeson-Glycopyrrol-Formoterol (BREZTRI AEROSPHERE) 160-9-4.8 MCG/ACT AERO Inhale 2 puffs into the lungs 2 (two) times daily. 09/22/21  Yes Gottschalk, Ashly M, DO  budesonide (PULMICORT) 0.25 MG/2ML nebulizer solution Take 2 mLs (0.25 mg total) by nebulization 2 (two) times daily. 08/05/21  Yes Chesley Mires, MD  cetirizine (ZYRTEC ALLERGY) 10 MG tablet Take 1 tablet (10 mg total) by mouth daily. 06/24/21  Yes Hawks, Christy A, FNP  diphenhydrAMINE (BENADRYL) 25 MG tablet Take 50 mg by mouth at bedtime.   Yes [provider]   fluticasone (FLONASE) 50 MCG/ACT nasal spray Place 2 sprays into both nostrils daily. 06/24/21  Yes Hawks, Christy A, FNP  furosemide (LASIX) 20 MG tablet Take 1 tablet (20 mg total) by mouth as needed (based on weight - may take one extra tab for weight gain of 3lb in 24 hrs or 5lb in 1 week). Patient taking differently: Take 20 mg by mouth daily as needed (based on weight - may take one extra tab for weight gain of 3lb in 24 hrs or 5lb in 1 week). 10/06/20  Yes Verta Ellen., NP  glucose blood test strip TEST BLOOD SUGAR TWICE DAILY 10/03/18  Yes [provider]  ibuprofen (ADVIL) 200 MG tablet Take 400 mg by mouth daily as needed (pain (leg pain)).   Yes [provider]  insulin degludec (TRESIBA FLEXTOUCH) 200 UNIT/ML FlexTouch Pen Inject 40 Units into the skin daily. Patient taking differently: Inject 38 Units into the skin in the morning. 04/24/20  Yes Ronnie Doss M, DO  Insulin Syringe-Needle U-100 31G X 5/16" 0.5 ML MISC Use to inject Insulin four times daily: Use as directed; Dx: E11.9, E66.9 10/03/18  Yes [provider]  ipratropium-albuterol (DUONEB) 0.5-2.5 (3) MG/3ML SOLN TAKE 3 ML (1 VIAL) BY NEBULIZATION EVERY 6 HOURS AS NEEDED FOR WHEEZING. Patient taking differently: Take 3 mLs by nebulization every 6 (six) hours as needed (wheezing). 02/16/21  Yes Ronnie Doss M, DO  isosorbide mononitrate (IMDUR) 30 MG 24 hr tablet Take 1 tablet (30 mg total) by mouth 2 (two) times daily. 09/15/21  Yes BranchAlphonse Guild, MD  lansoprazole (PREVACID) 15 MG capsule Take 30 mg by mouth at bedtime.   Yes [provider]  lisinopril (ZESTRIL) 20 MG tablet Take 1 tablet (20 mg total) by mouth daily. 09/15/21  Yes BranchAlphonse Guild, MD  magnesium oxide (MAG-OX) 400 MG tablet Take magnesium on days you take lasix 09/30/20  Yes Gottschalk, Ashly M, DO  methocarbamol (ROBAXIN) 500 MG tablet TAKE 0.5-1 TABLETS (250-500 MG TOTAL) 2 (TWO) TIMES DAILY AS NEEDED FOR  MUSCLE SPASMS (NECK PAIN). 08/02/21  Yes Ronnie Doss M, DO  metoprolol tartrate (LOPRESSOR) 25 MG tablet Take 1 tablet (25 mg total) by mouth 2 (two) times daily. 06/03/21  Yes Gottschalk, Ashly M, DO  montelukast (SINGULAIR) 10 MG tablet Take 1 tablet (10 mg total) by mouth at bedtime. 01/08/21  Yes Gottschalk, Ashly M, DO  pioglitazone (ACTOS) 30 MG tablet TAKE 1 TABLET BY MOUTH EVERY DAY 10/08/21  Yes Gottschalk, Ashly M, DO  potassium chloride (KLOR-CON M10) 10 MEQ tablet TAKE 1 TABLET (10 MEQ TOTAL) BY MOUTH EVERY EVENING. 10/12/21  Yes Gottschalk, Ashly M, DO  arformoterol (BROVANA) 15 MCG/2ML NEBU Take 2 mLs (15 mcg total) by nebulization 2 (two) times daily. 08/05/21   Chesley Mires, MD  Calcium Carb-Cholecalciferol (CALCIUM 600+D3 PO) Take  1 tablet by mouth in the morning.    [provider]  nitroGLYCERIN (NITROSTAT) 0.4 MG SL tablet Place 1 tablet (0.4 mg total) under the tongue every 5 (five) minutes x 3 doses as needed for chest pain (if no relief after 2nd dose, proceed to ED or call 911). 09/15/21   Arnoldo Lenis, MD    Allergies   Allergies  Allergen Reactions   Aspartame Rash   Penicillins Itching and Rash    Did it involve swelling of the face/tongue/throat, SOB, or low BP? No Did it involve sudden or severe rash/hives, skin peeling, or any reaction on the inside of your mouth or nose? No Did you need to seek medical attention at a hospital or doctor's office? No When did it last happen?~25 years ago       If all above answers are "NO", may proceed with cephalosporin use.    Wilder Glade [Dapagliflozin]     UTIs   Metformin And Related     GI distress, dizziness   No [facility]alt[facility] Food Allergies     Honey-Rash Artificial Sweeteners-rash   Paxil [Paroxetine Hcl] Other (See Comments)    Twitching/feels like skin crawling   Prozac [Fluoxetine Hcl] Other (See Comments)    Twitching/feels like skin crawling   Zoloft [Sertraline Hcl] Other (See Comments)     Twitching/feels like skin crawling   Prednisone Palpitations   Ranexa [Ranolazine] Palpitations    Heart racing    Review of Systems  ROS  Neurologic Exam  Awake, alert, oriented Memory and concentration grossly intact Speech fluent, appropriate CN grossly intact Motor exam: Upper Extremities Deltoid Bicep Tricep Grip  Right 5/5 5/5 5/5 5/5  Left 5/5 5/5 5/5 5/5   Lower Extremities IP Quad PF DF EHL  Right 5/5 5/5 5/5 5/5 5/5  Left 5/5 5/5 5/5 5/5 5/5   Sensation grossly intact to LT  Impression  - 71 y.o. female with 59m distal anterior cerebral artery aneurysm  Plan  -We will plan to proceed with coil embolization with possible stent assitance.  I have reviewed the details of the procedure as well as the expected postoperative course and recovery with the patient and her husband.  We have discussed the associated risks, benefits, and alternatives to the procedure.  All her questions today were answered and she provided informed consent to proceed.   NConsuella Lose MD [facility]

## 2021-11-27 ENCOUNTER — Encounter (HOSPITAL_COMMUNITY): Payer: Self-pay | Admitting: Neurosurgery

## 2021-11-27 MED ORDER — HYDROCODONE-ACETAMINOPHEN 5-325 MG PO TABS: 1.0000 | ORAL_TABLET | ORAL | 0 refills | Status: DC | PRN

## 2021-11-27 NOTE — Progress Notes (Signed)
   Providing Compassionate, Quality Care - Together  NEUROSURGERY PROGRESS NOTE   S: No issues overnight.  Slight headache this morning, appropriate  O: EXAM:  BP (!) 142/56   Pulse 72   Temp 97.7 F (36.5 C) (Axillary)   Resp 17   Ht 5' 3"  (1.6 m)   Wt 106.1 kg   SpO2 96%   BMI 41.45 kg/m   Awake, alert, oriented x3, sitting up eating breakfast PERRL Speech fluent, appropriate  CNs grossly intact  5/5 BUE/BLE  Right groin soft, dressing intact, no palpable hematoma Bilateral DP palpable pulses  ASSESSMENT:  71 y.o. female with   Right ACA aneurysm -Status post stent coil embolization, postoperative day 1  PLAN: -Discontinue A-line, mobilize -Possible home today -Progressing appropriately    Thank you for allowing me to participate in this patient's care.  Please do not hesitate to call with questions or concerns.   Elwin Sleight, [facility] Cell: [phone]

## 2021-11-27 NOTE — Discharge Summary (Signed)
Physician Discharge Summary  Patient ID: Amber Reeves MRN: [id number] DOB/AGE: 06-13-1950 71 y.o.  Admit date: 11/26/2021 Discharge date: 11/27/2021  Admission Diagnoses:  ACA Aneurysm  Discharge Diagnoses:  Same Principal Problem:   Brain aneurysm Active Problems:   Cerebral aneurysm   Discharged Condition: Stable  Hospital Course:  Amber Reeves is a 71 y.o. female underwent an elective stent assisted coiling of a right ACA aneurysm by Dr. Kathyrn Sheriff.  She tolerated the procedure well.  Was monitored postoperatively overnight in the neuro ICU.  Postoperatively she was at her neurologic baseline, ambulating independently.  Her pain was controlled on oral medication, she was tolerating normal diet.  She was having normal bowel bladder function.  Her right groin access site was not swollen, dressing was clean dry and intact.  She had intact dorsalis pedis pulses bilaterally.  Treatments: Surgery -stent assisted coiling right ACA aneurysm  Discharge Exam: Blood pressure 107/60, pulse 62, temperature 98.2 F (36.8 C), temperature source Oral, resp. rate 20, height 5' 3"  (1.6 m), weight 106.1 kg, SpO2 90 %. Awake, alert, oriented x3 PERRLA Face symmetric Speech fluent, appropriate CN grossly intact 5/5 BUE/BLE Right groin soft, dressing c/d/i  Disposition: Discharge disposition: 01-Home or Self Care        Allergies as of 11/27/2021       Reactions   Aspartame Rash   Penicillins Itching, Rash   Did it involve swelling of the face/tongue/throat, SOB, or low BP? No Did it involve sudden or severe rash/hives, skin peeling, or any reaction on the inside of your mouth or nose? No Did you need to seek medical attention at a hospital or doctor's office? No When did it last happen?~25 years ago       If all above answers are "NO", may proceed with cephalosporin use.   Farxiga [dapagliflozin]    UTIs   Metformin And Related    GI distress, dizziness   No  [facility]alt[employer] Food Allergies    Honey-Rash Artificial Sweeteners-rash   Paxil [paroxetine Hcl] Other (See Comments)   Twitching/feels like skin crawling   Prozac [fluoxetine Hcl] Other (See Comments)   Twitching/feels like skin crawling   Zoloft [sertraline Hcl] Other (See Comments)   Twitching/feels like skin crawling   Prednisone Palpitations   Ranexa [ranolazine] Palpitations   Heart racing        Medication List     TAKE these medications    Accu-Chek Aviva Soln 2 Bottles by Other route as needed.   albuterol 108 (90 Base) MCG/ACT inhaler Commonly known as: VENTOLIN HFA TAKE 2 PUFFS BY MOUTH EVERY 4 HOURS AS NEEDED FOR WHEEZE   amLODipine 10 MG tablet Commonly known as: NORVASC Take 1 tablet (10 mg total) by mouth daily.   aspirin EC 81 MG tablet Take 81 mg by mouth in the morning.   atorvastatin 40 MG tablet Commonly known as: LIPITOR Take 1 tablet (40 mg total) by mouth at bedtime.   B-D SINGLE USE SWABS REGULAR Pads Apply topically.   Breztri Aerosphere 160-9-4.8 MCG/ACT Aero Generic drug: Budeson-Glycopyrrol-Formoterol Inhale 2 puffs into the lungs 2 (two) times daily.   budesonide 0.25 MG/2ML nebulizer solution Commonly known as: Pulmicort Take 2 mLs (0.25 mg total) by nebulization 2 (two) times daily. What changed:  when to take this reasons to take this   budesonide-formoterol 160-4.5 MCG/ACT inhaler Commonly known as: SYMBICORT Inhale 1 puff into the lungs 2 (two) times daily.   CALCIUM 600+D3 PO Take 1 tablet by  mouth in the morning.   cetirizine 10 MG tablet Commonly known as: ZyrTEC Allergy Take 1 tablet (10 mg total) by mouth daily.   diphenhydrAMINE 25 MG tablet Commonly known as: BENADRYL Take 50 mg by mouth daily as needed for allergies.   fluticasone 50 MCG/ACT nasal spray Commonly known as: FLONASE Place 2 sprays into both nostrils daily.   furosemide 20 MG tablet Commonly known as: LASIX Take 1 tablet (20 mg total) by mouth  as needed (based on weight - may take one extra tab for weight gain of 3lb in 24 hrs or 5lb in 1 week). What changed: when to take this   glucose blood test strip TEST BLOOD SUGAR TWICE DAILY   HYDROcodone-acetaminophen 5-325 MG tablet Commonly known as: NORCO/VICODIN Take 1 tablet by mouth every 4 (four) hours as needed for moderate pain.   ibuprofen 200 MG tablet Commonly known as: ADVIL Take 400-600 mg by mouth daily as needed (pain (leg pain)).   Insulin Syringe-Needle U-100 31G X 5/16" 0.5 ML Misc Use to inject Insulin four times daily: Use as directed; Dx: E11.9, E66.9   ipratropium-albuterol 0.5-2.5 (3) MG/3ML Soln Commonly known as: DUONEB TAKE 3 ML (1 VIAL) BY NEBULIZATION EVERY 6 HOURS AS NEEDED FOR WHEEZING. What changed: See the new instructions.   isosorbide mononitrate 30 MG 24 hr tablet Commonly known as: IMDUR Take 15 mg by mouth 2 (two) times daily.   lansoprazole 15 MG capsule Commonly known as: PREVACID Take 30 mg by mouth at bedtime.   lisinopril 20 MG tablet Commonly known as: ZESTRIL Take 1 tablet (20 mg total) by mouth daily.   magnesium oxide 400 MG tablet Commonly known as: MAG-OX Take magnesium on days you take lasix What changed:  how much to take how to take this when to take this additional instructions   metoprolol tartrate 25 MG tablet Commonly known as: LOPRESSOR TAKE 1 TABLET BY MOUTH TWICE A DAY   montelukast 10 MG tablet Commonly known as: SINGULAIR Take 1 tablet (10 mg total) by mouth at bedtime.   nitroGLYCERIN 0.4 MG SL tablet Commonly known as: NITROSTAT Place 1 tablet (0.4 mg total) under the tongue every 5 (five) minutes x 3 doses as needed for chest pain (if no relief after 2nd dose, proceed to ED or call 911).   pioglitazone 30 MG tablet Commonly known as: ACTOS Take 15 mg by mouth daily.   potassium chloride 10 MEQ tablet Commonly known as: Klor-Con M10 TAKE 1 TABLET (10 MEQ TOTAL) BY MOUTH EVERY EVENING.    ticagrelor 90 MG Tabs tablet Commonly known as: BRILINTA Take 90 mg by mouth 2 (two) times daily.   Tyler Aas FlexTouch 200 UNIT/ML FlexTouch Pen Generic drug: insulin degludec Inject 40 Units into the skin daily. What changed:  how much to take when to take this        Follow-up Information     Consuella Lose, MD Follow up in 3 week(s).   Specialty: Neurosurgery Contact information: [address] [place] [address] [phone]                 Signed: Theodoro Doing  11/27/2021, 2:35 PM

## 2021-11-27 NOTE — Progress Notes (Signed)
Patient able to ambulate 300 feet in the hallway with standby assist using a walker. Patient not wearing O2, sats above 93% for duration. Tolerated well with no issues.

## 2021-11-28 LAB — TYPE AND SCREEN
ABO/RH(D): A NEG
Antibody Screen: POSITIVE
Unit division: 0
Unit division: 0

## 2021-11-28 LAB — BPAM RBC
Blood Product Expiration Date: 202310262359
Blood Product Expiration Date: 202310262359
Unit Type and Rh: 600
Unit Type and Rh: 600

## 2021-11-29 ENCOUNTER — Telehealth: Payer: Self-pay | Admitting: *Deleted

## 2021-11-29 ENCOUNTER — Encounter: Payer: Self-pay | Admitting: *Deleted

## 2021-11-29 LAB — GLUCOSE, CAPILLARY
Glucose-Capillary: 123 mg/dL — ABNORMAL HIGH (ref 70–99)
Glucose-Capillary: 188 mg/dL — ABNORMAL HIGH (ref 70–99)

## 2021-11-29 NOTE — Patient Outreach (Signed)
  Care Coordination [facility] Note Transition Care Management Follow-up Telephone Call Date of discharge and from where: 11/27/21 from [facility] How have you been since you were released from the hospital? "I've still been having a headache off and on. That was expected. The dizziness is better." Any questions or concerns? Yes. Shoulder blade went out of place during surgery. It was put back in place and she's still sore but getting better. Orthopedic doctor assessed.  Items Reviewed: Did the pt receive and understand the discharge instructions provided? Yes  Medications obtained and verified? Yes  Other? Yes . Reviewed family history of aneurysm and personal history of multiple shoulder dislocations. Reviewed medications and recent changes. Discussed diabetes management and blood sugar control. Recommended to take hydrocodone every 4 hours to manage head pain instead of waiting for pain to increase prior to taking meds. Can take ibuprofen in between as needed. Patient was trying to stretch the meds out but pain level would reach 10 out of 10 and come down to 5 out of 10. Any new allergies since your discharge? No  Dietary orders reviewed? Yes Do you have support at home? Yes   Home Care and Equipment/Supplies: Were home health services ordered? not applicable If so, what is the name of the agency? N/a  Has the agency set up a time to come to the patient's home? not applicable Were any new equipment or medical supplies ordered?  No What is the name of the medical supply agency? N/a Were you able to get the supplies/equipment? not applicable Do you have any questions related to the use of the equipment or supplies? No  Functional Questionnaire: (I = Independent and D = Dependent) ADLs: I  Bathing/Dressing- I  Meal Prep- I  Eating- I  Maintaining continence- I  Transferring/Ambulation- I  Managing Meds- I  Follow up appointments reviewed:  PCP Hospital f/u appt confirmed?  Not  indicated on discharge papers   [facility] f/u appt confirmed? Yes  Scheduled to see Dr Kathyrn Sheriff (neurosurgeon) on 12/08/21 Are transportation arrangements needed? No  If their condition worsens, is the pt aware to call PCP or go to the Emergency Dept.? Yes Was the patient provided with contact information for the PCP's office or ED? Yes Was to pt encouraged to call back with questions or concerns? Yes  SDOH assessments and interventions completed:   Yes  Care Coordination Interventions Activated:  Yes   Care Coordination Interventions:  Provided information on [facility] Care Coordination services. Patient will consider.  Reviewed medications and provided recommendations on pain management    Encounter Outcome:  Pt. Visit Completed    Chong Sicilian, BSN, RN-BC RN Care Coordinator [place]: [phone] Main #: [phone]

## 2021-12-06 DIAGNOSIS — I671 Cerebral aneurysm, nonruptured: Secondary | ICD-10-CM | POA: Diagnosis not present

## 2021-12-06 DIAGNOSIS — Z6841 Body Mass Index (BMI) 40.0 and over, adult: Secondary | ICD-10-CM | POA: Diagnosis not present

## 2021-12-16 ENCOUNTER — Other Ambulatory Visit: Payer: Self-pay | Admitting: Cardiology

## 2021-12-16 DIAGNOSIS — I152 Hypertension secondary to endocrine disorders: Secondary | ICD-10-CM

## 2021-12-20 MED ORDER — IOHEXOL 300 MG/ML  SOLN
100.0000 mL | Freq: Once | INTRAMUSCULAR | Status: AC | PRN
  Administered 2021-12-20: 64 mL via INTRA_ARTERIAL

## 2021-12-20 NOTE — Addendum Note (Signed)
Encounter addended by: ,  H on: 12/20/2021 2:22 PM  Actions taken: Imaging Exam ended, Care Plan modified, Pharmacy for encounter modified, Order list changed, MAR administration accepted

## 2021-12-21 IMAGING — DX DG CHEST 2V
3 series · 3 of 3 positions shown · non-contrast
Comparison: None.

CLINICAL DATA: Chest pain and shortness of breath.

EXAM:
CHEST - 2 VIEW

[chest pa (1 of 2)]
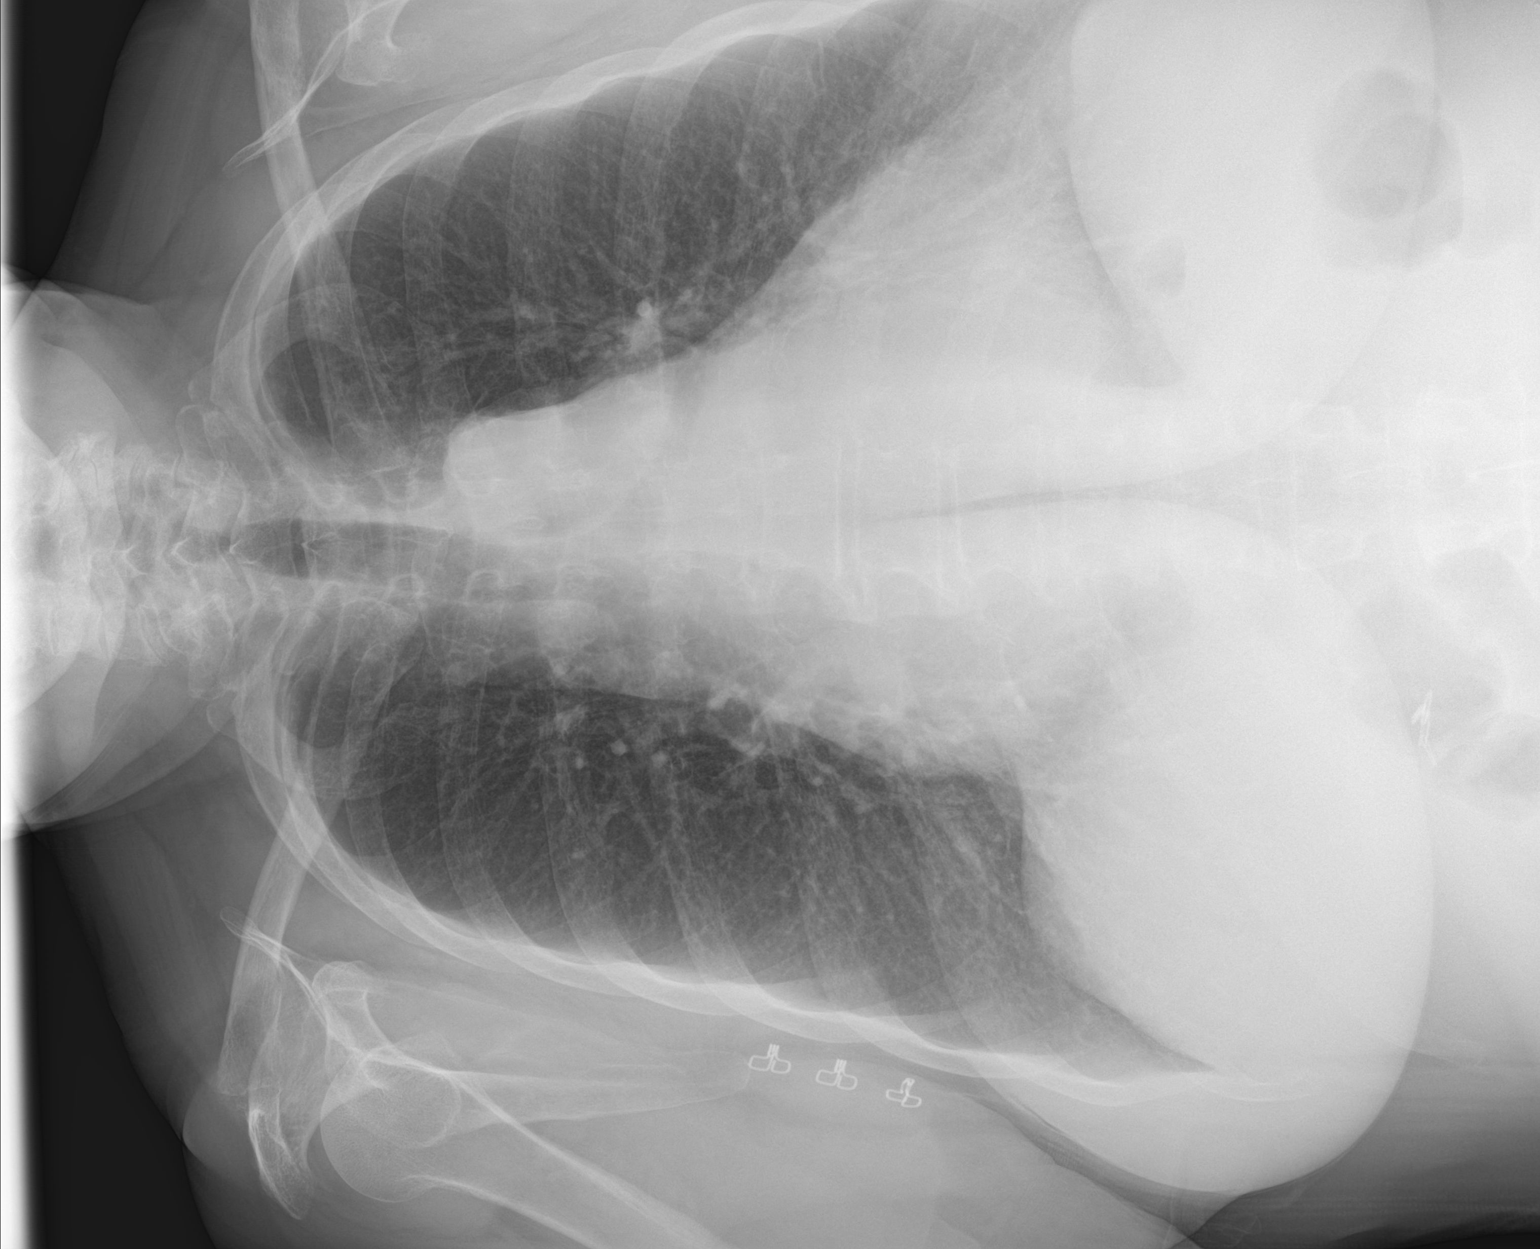

[chest lat]
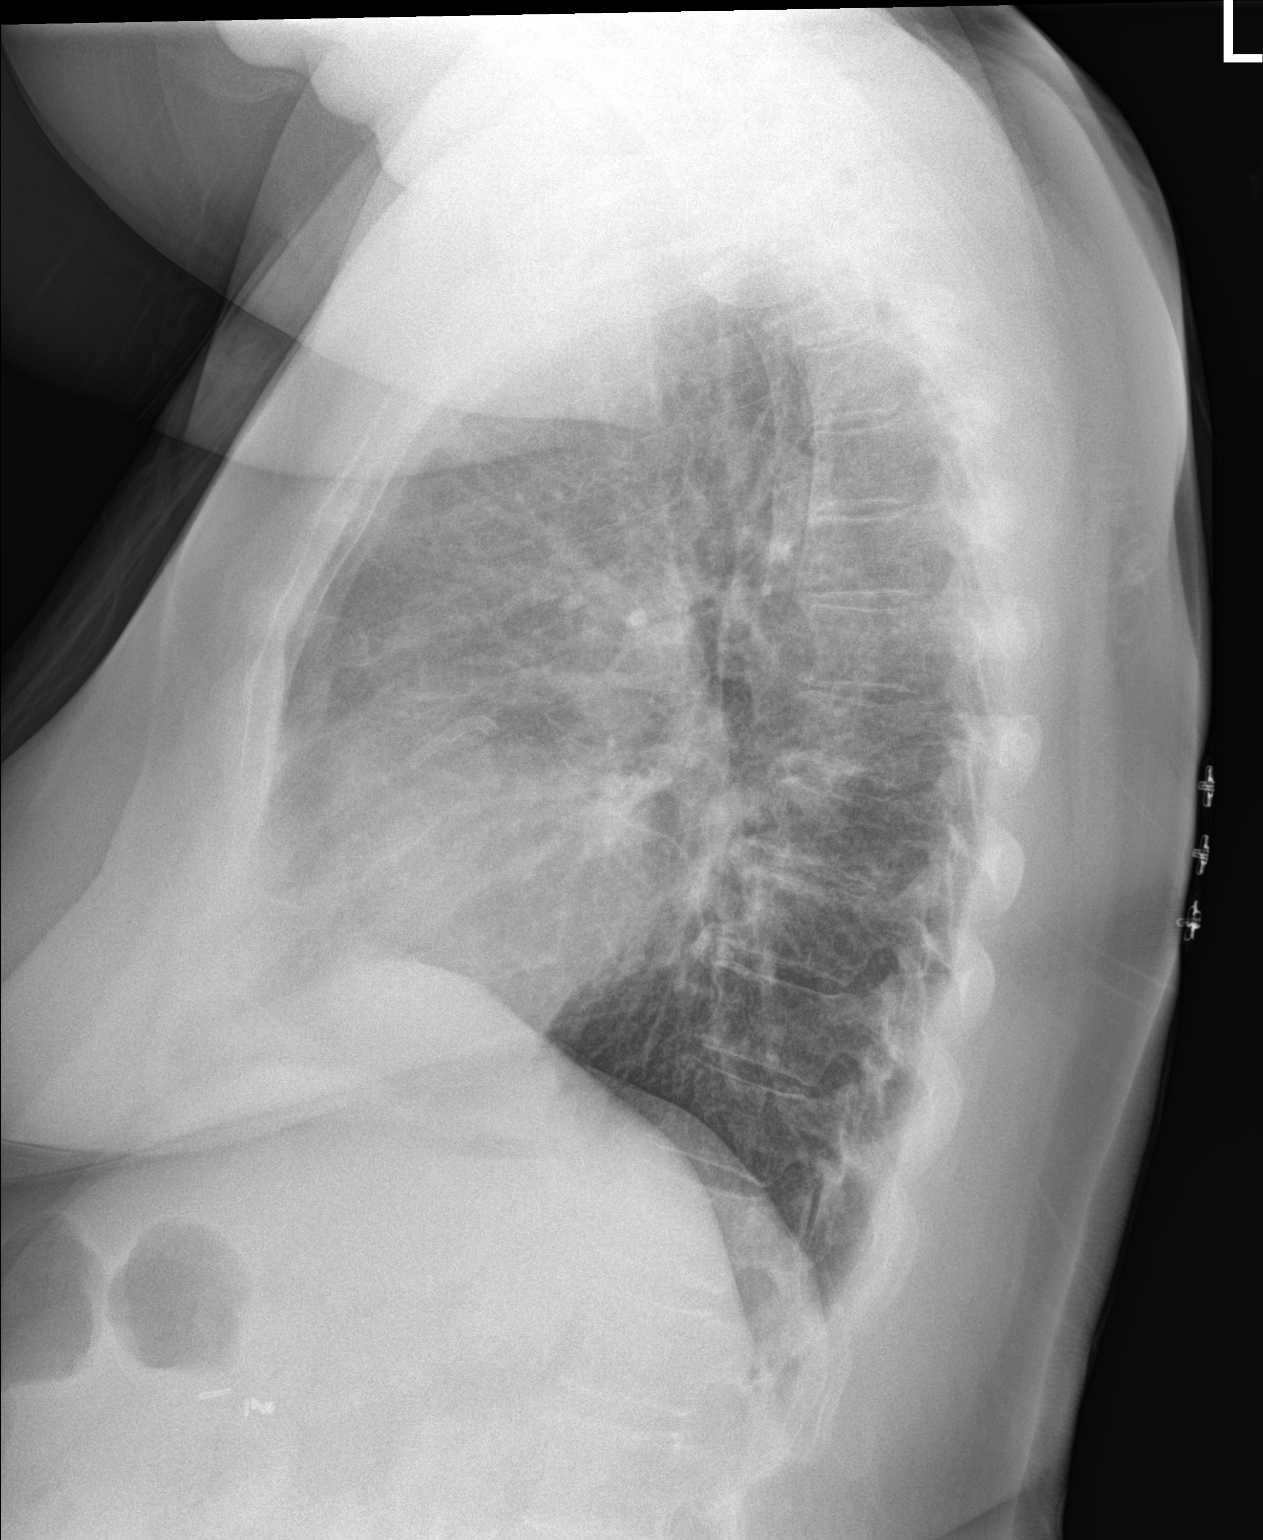

[chest pa (2 of 2)]
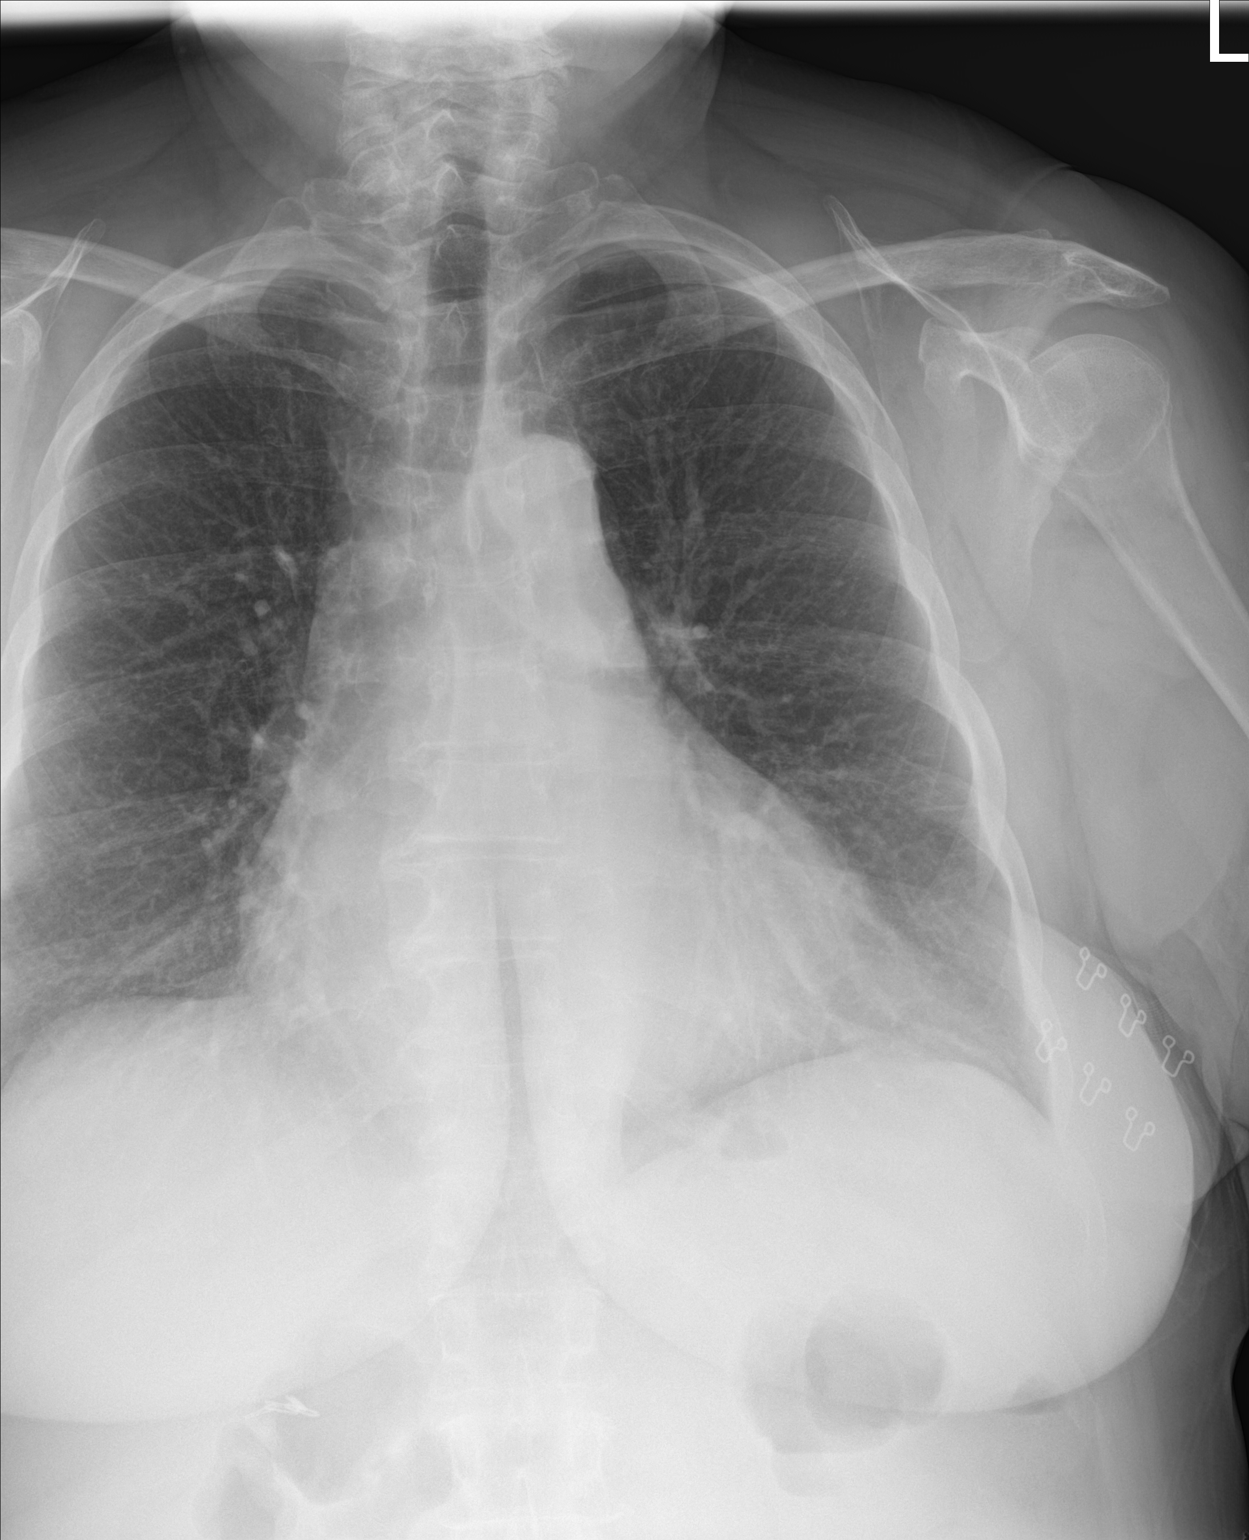

[3 of 3 positions shown; findings below may reference images not displayed]

FINDINGS: The lungs are hyperinflated. Mild, diffuse, chronic appearing
increased interstitial lung markings are seen. There is no evidence
of a pleural effusion or pneumothorax. There is mild to moderate
severity enlargement of the cardiac silhouette. Radiopaque surgical
clips are seen within the right upper quadrant. The visualized
skeletal structures are unremarkable.
IMPRESSION: 1. Cardiomegaly without acute or active cardiopulmonary disease.

## 2021-12-29 ENCOUNTER — Emergency Department (HOSPITAL_COMMUNITY): Payer: Medicare HMO

## 2021-12-29 ENCOUNTER — Encounter (HOSPITAL_COMMUNITY): Payer: Self-pay

## 2021-12-29 ENCOUNTER — Other Ambulatory Visit: Payer: Self-pay

## 2021-12-29 ENCOUNTER — Ambulatory Visit: Payer: Medicare HMO | Admitting: Family Medicine

## 2021-12-29 ENCOUNTER — Emergency Department (HOSPITAL_COMMUNITY)
Admission: EM | Admit: 2021-12-29 | Discharge: 2021-12-29 | Disposition: A | Discharge status: Home / Self Care | Payer: Medicare HMO | Attending: Emergency Medicine | Admitting: Emergency Medicine

## 2021-12-29 DIAGNOSIS — R519 Headache, unspecified: Secondary | ICD-10-CM | POA: Diagnosis not present

## 2021-12-29 DIAGNOSIS — R1111 Vomiting without nausea: Secondary | ICD-10-CM | POA: Diagnosis not present

## 2021-12-29 DIAGNOSIS — R110 Nausea: Secondary | ICD-10-CM | POA: Diagnosis not present

## 2021-12-29 DIAGNOSIS — R42 Dizziness and giddiness: Secondary | ICD-10-CM | POA: Insufficient documentation

## 2021-12-29 DIAGNOSIS — Z7982 Long term (current) use of aspirin: Secondary | ICD-10-CM | POA: Diagnosis not present

## 2021-12-29 LAB — CBC WITH DIFFERENTIAL/PLATELET
Abs Immature Granulocytes: 0.04 10*3/uL (ref 0.00–0.07)
Basophils Absolute: 0 10*3/uL (ref 0.0–0.1)
Basophils Relative: 1 %
Eosinophils Absolute: 0.2 10*3/uL (ref 0.0–0.5)
Eosinophils Relative: 2 %
HCT: 36.3 % (ref 36.0–46.0)
Hemoglobin: 11.9 g/dL — ABNORMAL LOW (ref 12.0–15.0)
Immature Granulocytes: 1 %
Lymphocytes Relative: 13 %
Lymphs Abs: 0.9 10*3/uL (ref 0.7–4.0)
MCH: 31.7 pg (ref 26.0–34.0)
MCHC: 32.8 g/dL (ref 30.0–36.0)
MCV: 96.8 fL (ref 80.0–100.0)
Monocytes Absolute: 0.5 10*3/uL (ref 0.1–1.0)
Monocytes Relative: 8 %
Neutro Abs: 5.4 10*3/uL (ref 1.7–7.7)
Neutrophils Relative %: 75 %
Platelets: 175 10*3/uL (ref 150–400)
RBC: 3.75 MIL/uL — ABNORMAL LOW (ref 3.87–5.11)
RDW: 13.9 % (ref 11.5–15.5)
WBC: 7.1 10*3/uL (ref 4.0–10.5)
nRBC: 0 % (ref 0.0–0.2)

## 2021-12-29 LAB — CBG MONITORING, ED: Glucose-Capillary: 151 mg/dL — ABNORMAL HIGH (ref 70–99)

## 2021-12-29 LAB — COMPREHENSIVE METABOLIC PANEL
ALT: 15 U/L (ref 0–44)
AST: 16 U/L (ref 15–41)
Albumin: 3.4 g/dL — ABNORMAL LOW (ref 3.5–5.0)
Alkaline Phosphatase: 42 U/L (ref 38–126)
Anion gap: 6 (ref 5–15)
BUN: 23 mg/dL (ref 8–23)
CO2: 19 mmol/L — ABNORMAL LOW (ref 22–32)
Calcium: 8.3 mg/dL — ABNORMAL LOW (ref 8.9–10.3)
Chloride: 111 mmol/L (ref 98–111)
Creatinine, Ser: 1.17 mg/dL — ABNORMAL HIGH (ref 0.44–1.00)
GFR, Estimated: 50 mL/min — ABNORMAL LOW (ref >60)
Glucose, Bld: 130 mg/dL — ABNORMAL HIGH (ref 70–99)
Potassium: 4.1 mmol/L (ref 3.5–5.1)
Sodium: 136 mmol/L (ref 135–145)
Total Bilirubin: 0.6 mg/dL (ref 0.3–1.2)
Total Protein: 7 g/dL (ref 6.5–8.1)

## 2021-12-29 LAB — TROPONIN I (HIGH SENSITIVITY)
Troponin I (High Sensitivity): 13 ng/L (ref <18)
Troponin I (High Sensitivity): 12 ng/L (ref <18)

## 2021-12-29 LAB — URINALYSIS, ROUTINE W REFLEX MICROSCOPIC
Bacteria, UA: NONE SEEN
Bilirubin Urine: NEGATIVE
Glucose, UA: NEGATIVE mg/dL
Ketones, ur: NEGATIVE mg/dL
Leukocytes,Ua: NEGATIVE
Nitrite: NEGATIVE
Protein, ur: NEGATIVE mg/dL
Specific Gravity, Urine: 1.009 (ref 1.005–1.030)
pH: 5 (ref 5.0–8.0)

## 2021-12-29 MED ORDER — LORAZEPAM 2 MG/ML IJ SOLN
0.5000 mg | Freq: Once | INTRAMUSCULAR | Status: AC
  Administered 2021-12-29: 0.5 mg via INTRAVENOUS
  Filled 2021-12-29: qty 1

## 2021-12-29 MED ORDER — SODIUM CHLORIDE 0.9 % IV BOLUS
500.0000 mL | Freq: Once | INTRAVENOUS | Status: AC
  Administered 2021-12-29: 500 mL via INTRAVENOUS

## 2021-12-29 MED ORDER — IOHEXOL 350 MG/ML SOLN
75.0000 mL | Freq: Once | INTRAVENOUS | Status: AC | PRN
  Administered 2021-12-29: 75 mL via INTRAVENOUS

## 2021-12-29 MED ORDER — MECLIZINE HCL 25 MG PO TABS: 25.0000 mg | ORAL_TABLET | Freq: Three times a day (TID) | ORAL | 1 refills | Status: DC | PRN

## 2021-12-29 MED ORDER — MECLIZINE HCL 12.5 MG PO TABS
25.0000 mg | ORAL_TABLET | Freq: Once | ORAL | Status: AC
  Administered 2021-12-29: 25 mg via ORAL
  Filled 2021-12-29: qty 2

## 2021-12-29 NOTE — ED Triage Notes (Signed)
Pt via [facility] c/o of dizziness and nausea after waking up. Denies pain. History of aneurysm with stent placement x1 month ago.  IV 22 left hand  4 zofran given in route

## 2021-12-29 NOTE — ED Provider Notes (Signed)
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  No headache. Very positional  Physical Exam  BP (!) 153/57   Pulse 62   Temp 97.6 F (36.4 C) (Oral)   Resp 14   Ht 5' 3"  (1.6 m)   Wt 108.9 kg   SpO2 94%   BMI 42.51 kg/m   Physical Exam  Procedures  Procedures  ED Course / MDM   Clinical Course as of 12/29/21 1735  Wed Dec 29, 2021  1354 The patient was able to ambulate relatively steadily with the nurse around the emergency department.  She has had dramatic improvement of her vertigo.  I suspect this is likely peripheral vertigo prescribe meclizine for home.  They have an ENT doctor they already seen in the past, and I recommended she follow-up again with ENT. [MT]    Clinical Course User Index [MT] , Carola Rhine, MD   Medical Decision Making Amount and/or Complexity of Data Reviewed Labs: ordered.  Risk Prescription drug management.   History and exam consistent with peripheral vertigo Pending labs, CTA head to evaluate aneurysm site Meclizine and ativan ordered for dizziness      Wyvonnia Dusky, MD 12/29/21 1735

## 2021-12-29 NOTE — ED Provider Notes (Signed)
[facility] EMERGENCY DEPARTMENT Provider Note   CSN: [id number] Arrival date & time: 12/29/21  0600     History  Chief Complaint  Patient presents with   Dizziness    Amber Reeves is a 71 y.o. female.  Patient brought to the emergency department from home because of dizziness.  Patient reports that she woke up this morning, rolled over in bed and felt like the room is spinning.  She then started to feel weak all over and became very sweaty.  Patient reports that she recently had a procedure for her brain aneurysm.  The aneurysm was found when she had an MRI to work-up similar dizzy spells that she has had in the past.       Home Medications Prior to Admission medications   Medication Sig Start Date End Date Taking? Authorizing Provider  albuterol (VENTOLIN HFA) 108 (90 Base) MCG/ACT inhaler TAKE 2 PUFFS BY MOUTH EVERY 4 HOURS AS NEEDED FOR WHEEZE 09/16/21   Ronnie Doss M, DO  Alcohol Swabs (B-D SINGLE USE SWABS REGULAR) PADS Apply topically. 07/29/15   [provider]  amLODipine (NORVASC) 10 MG tablet Take 1 tablet (10 mg total) by mouth daily. 03/15/21 09/16/78  Arnoldo Lenis, MD  aspirin EC 81 MG tablet Take 81 mg by mouth in the morning.    [provider]  atorvastatin (LIPITOR) 40 MG tablet Take 1 tablet (40 mg total) by mouth at bedtime. 09/30/20   Janora Norlander, DO  Blood Glucose Calibration (ACCU-CHEK AVIVA) SOLN 2 Bottles by Other route as needed. 07/29/15   [provider]  Budeson-Glycopyrrol-Formoterol (BREZTRI AEROSPHERE) 160-9-4.8 MCG/ACT AERO Inhale 2 puffs into the lungs 2 (two) times daily. Patient not taking: Reported on 11/11/2021 09/22/21   Ronnie Doss M, DO  budesonide (PULMICORT) 0.25 MG/2ML nebulizer solution Take 2 mLs (0.25 mg total) by nebulization 2 (two) times daily. Patient taking differently: Take 0.25 mg by nebulization 2 (two) times daily as needed (shortness of breath). 08/05/21   Chesley Mires, MD   budesonide-formoterol (SYMBICORT) 160-4.5 MCG/ACT inhaler Inhale 1 puff into the lungs 2 (two) times daily.    [provider]  Calcium Carb-Cholecalciferol (CALCIUM 600+D3 PO) Take 1 tablet by mouth in the morning.    [provider]  cetirizine (ZYRTEC ALLERGY) 10 MG tablet Take 1 tablet (10 mg total) by mouth daily. Patient not taking: Reported on 11/11/2021 06/24/21   Sharion Balloon, FNP  diphenhydrAMINE (BENADRYL) 25 MG tablet Take 50 mg by mouth daily as needed for allergies.    [provider]  fluticasone (FLONASE) 50 MCG/ACT nasal spray Place 2 sprays into both nostrils daily. 06/24/21   Sharion Balloon, FNP  furosemide (LASIX) 20 MG tablet Take 1 tablet (20 mg total) by mouth as needed (based on weight - may take one extra tab for weight gain of 3lb in 24 hrs or 5lb in 1 week). Patient taking differently: Take 20 mg by mouth daily as needed (based on weight - may take one extra tab for weight gain of 3lb in 24 hrs or 5lb in 1 week). 10/06/20   Verta Ellen., NP  glucose blood test strip TEST BLOOD SUGAR TWICE DAILY 10/03/18   [provider]  HYDROcodone-acetaminophen (NORCO/VICODIN) 5-325 MG tablet Take 1 tablet by mouth every 4 (four) hours as needed for moderate pain. 11/27/21   Dawley, Troy C, DO  ibuprofen (ADVIL) 200 MG tablet Take 400-600 mg by mouth daily as needed (pain (  leg pain)).    [provider]  insulin degludec (TRESIBA FLEXTOUCH) 200 UNIT/ML FlexTouch Pen Inject 40 Units into the skin daily. Patient taking differently: Inject 32 Units into the skin in the morning. 04/24/20   Janora Norlander, DO  Insulin Syringe-Needle U-100 31G X 5/16" 0.5 ML MISC Use to inject Insulin four times daily: Use as directed; Dx: E11.9, E66.9 10/03/18   [provider]  ipratropium-albuterol (DUONEB) 0.5-2.5 (3) MG/3ML SOLN TAKE 3 ML (1 VIAL) BY NEBULIZATION EVERY 6 HOURS AS NEEDED FOR WHEEZING. Patient taking differently: Take 3 mLs by  nebulization every 6 (six) hours as needed (wheezing). 02/16/21   Janora Norlander, DO  isosorbide mononitrate (IMDUR) 30 MG 24 hr tablet Take 15 mg by mouth 2 (two) times daily.    [provider]  lansoprazole (PREVACID) 15 MG capsule Take 30 mg by mouth at bedtime.    [provider]  lisinopril (ZESTRIL) 20 MG tablet TAKE 1 TABLET BY MOUTH EVERY DAY 12/16/21   Arnoldo Lenis, MD  magnesium oxide (MAG-OX) 400 MG tablet Take magnesium on days you take lasix Patient taking differently: Take 400 mg by mouth daily. 09/30/20   Janora Norlander, DO  metoprolol tartrate (LOPRESSOR) 25 MG tablet TAKE 1 TABLET BY MOUTH TWICE A DAY 11/22/21   Gottschalk, Ashly M, DO  montelukast (SINGULAIR) 10 MG tablet Take 1 tablet (10 mg total) by mouth at bedtime. 01/08/21   Janora Norlander, DO  nitroGLYCERIN (NITROSTAT) 0.4 MG SL tablet Place 1 tablet (0.4 mg total) under the tongue every 5 (five) minutes x 3 doses as needed for chest pain (if no relief after 2nd dose, proceed to ED or call 911). 09/15/21   Arnoldo Lenis, MD  pioglitazone (ACTOS) 30 MG tablet Take 15 mg by mouth daily.    [provider]  potassium chloride (KLOR-CON M10) 10 MEQ tablet TAKE 1 TABLET (10 MEQ TOTAL) BY MOUTH EVERY EVENING. 10/12/21   Ronnie Doss M, DO  ticagrelor (BRILINTA) 90 MG TABS tablet Take 90 mg by mouth 2 (two) times daily.    [provider]      Allergies    Aspartame, Penicillins, Farxiga [dapagliflozin], Metformin and related, No healthtouch food allergies, Paxil [paroxetine hcl], Prozac [fluoxetine hcl], Zoloft [sertraline hcl], Prednisone, and Ranexa [ranolazine]    Review of Systems   Review of Systems  Physical Exam Updated Vital Signs BP (!) 153/57   Pulse (!) 59   Temp 97.6 F (36.4 C) (Oral)   Resp 13   Ht 5' 3"  (1.6 m)   Wt 108.9 kg   SpO2 91%   BMI 42.51 kg/m  Physical Exam Vitals and nursing note reviewed.  Constitutional:      General: She  is not in acute distress.    Appearance: She is well-developed.  HENT:     Head: Normocephalic and atraumatic.     Mouth/Throat:     Mouth: Mucous membranes are moist.  Eyes:     General: Vision grossly intact. Gaze aligned appropriately.     Extraocular Movements: Extraocular movements intact.     Conjunctiva/sclera: Conjunctivae normal.  Cardiovascular:     Rate and Rhythm: Normal rate and regular rhythm.     Pulses: Normal pulses.     Heart sounds: Normal heart sounds, S1 normal and S2 normal. No murmur heard.    No friction rub. No gallop.  Pulmonary:     Effort: Pulmonary effort is normal. No respiratory distress.  Breath sounds: Normal breath sounds.  Abdominal:     General: Bowel sounds are normal.     Palpations: Abdomen is soft.     Tenderness: There is no abdominal tenderness. There is no guarding or rebound.     Hernia: No hernia is present.  Musculoskeletal:        General: No swelling.     Cervical back: Full passive range of motion without pain, normal range of motion and neck supple. No spinous process tenderness or muscular tenderness. Normal range of motion.     Right lower leg: No edema.     Left lower leg: No edema.  Skin:    General: Skin is warm and dry.     Capillary Refill: Capillary refill takes less than 2 seconds.     Findings: No ecchymosis, erythema, rash or wound.  Neurological:     General: No focal deficit present.     Mental Status: She is alert and oriented to person, place, and time.     GCS: GCS eye subscore is 4. GCS verbal subscore is 5. GCS motor subscore is 6.     Cranial Nerves: Cranial nerves 2-12 are intact.     Sensory: Sensation is intact.     Motor: Motor function is intact.     Coordination: Coordination is intact.     Comments: Extraocular muscle movement: normal No visual field cut Pupils: equal and reactive both direct and consensual response is normal No nystagmus present    Sensory function is intact to light touch,  pinprick Proprioception intact  Grip strength 5/5 symmetric in upper extremities No pronator drift Normal finger to nose bilaterally  Lower extremity strength 5/5 against gravity Normal heel to shin bilaterally     Psychiatric:        Attention and Perception: Attention normal.        Mood and Affect: Mood normal.        Speech: Speech normal.        Behavior: Behavior normal.     ED Results / Procedures / Treatments   Labs (all labs ordered are listed, but only abnormal results are displayed) Labs Reviewed  CBG MONITORING, ED - Abnormal; Notable for the following components:      Result Value   Glucose-Capillary 151 (*)    All other components within normal limits  CBC WITH DIFFERENTIAL/PLATELET  COMPREHENSIVE METABOLIC PANEL  URINALYSIS, ROUTINE W REFLEX MICROSCOPIC  TROPONIN I (HIGH SENSITIVITY)    EKG EKG Interpretation  Date/Time:  Wednesday December 29 2021 76:54:65 EST Ventricular Rate:  67 PR Interval:  157 QRS Duration: 99 QT Interval:  418 QTC Calculation: 442 R Axis:   -23 Text Interpretation: Sinus rhythm Borderline left axis deviation Abnormal R-wave progression, late transition Confirmed by Orpah Greek [phone]) on 12/29/2021 6:47:14 AM  Radiology No results found.  Procedures Procedures    Medications Ordered in ED Medications  meclizine (ANTIVERT) tablet 25 mg (has no administration in time range)  LORazepam (ATIVAN) injection 0.5 mg (has no administration in time range)  sodium chloride 0.9 % bolus 500 mL (has no administration in time range)    ED Course/ Medical Decision Making/ A&P                           Medical Decision Making Amount and/or Complexity of Data Reviewed External Data Reviewed: labs, radiology, ECG and notes. Labs: ordered. Decision-making details documented in ED Course. ECG/medicine tests:  ordered and independent interpretation performed. Decision-making details documented in ED  Course.  Risk Prescription drug management.   Presents to the emergency department for evaluation of vertiginous dizziness.  Symptoms began suddenly when she rolled over in bed this morning.  She reports that she is currently comfortable when she lies still but if she moves her head the dizziness recurs.  Diagnosis considered includes but not limited to acute stroke, TIA, intracranial hemorrhage related to recent aneurysm coiling, peripheral vertigo.  Examination does not reveal any focal neurologic deficits.  Symptoms are related to movement of her head, suspect this is vertical in nature.  This is supported by the fact that she has had similar episodes in the past that have been worked up.  Reviewing her recent records does reveal that she had elective coiling of aneurysm.  Will check labs, administer IV fluids, meclizine, Ativan for treatment of vertigo.  CT angiography if creatinine is normal.  Will sign out to oncoming ER physician to follow results.        Final Clinical Impression(s) / ED Diagnoses Final diagnoses:  None    Rx / DC Orders ED Discharge Orders     None         , Gwenyth Allegra, MD 12/29/21 [phone]

## 2021-12-29 NOTE — ED Notes (Signed)
Patient walked about 20 feet with slow and steady agit. Patient states "I feel a little wobbly." Dr. Langston Masker made aware; awaiting orders for discharge.

## 2021-12-31 ENCOUNTER — Telehealth: Payer: Self-pay | Admitting: Family Medicine

## 2021-12-31 NOTE — Telephone Encounter (Signed)
Pt scheduled  

## 2022-01-02 ENCOUNTER — Other Ambulatory Visit: Payer: Self-pay | Admitting: Family Medicine

## 2022-01-02 DIAGNOSIS — E1165 Type 2 diabetes mellitus with hyperglycemia: Secondary | ICD-10-CM

## 2022-01-03 ENCOUNTER — Encounter: Payer: Self-pay | Admitting: *Deleted

## 2022-01-03 ENCOUNTER — Telehealth: Payer: Self-pay | Admitting: *Deleted

## 2022-01-03 NOTE — Patient Outreach (Signed)
  Care Coordination [facility] Note Transition Care Management Follow-up Telephone Call Date of discharge and from where: ED Discharge from [facility] on 12/29/21 How have you been since you were released from the hospital? "I'm still having vertigo and the medication isn't helping" Any questions or concerns? No  Items Reviewed: Did the pt receive and understand the discharge instructions provided? Yes  Medications obtained and verified? Yes  Other? Yes Falls assessment performed and discussed fall prevention Any new allergies since your discharge? No  Dietary orders reviewed? Yes Do you have support at home? Yes   Home Care and Equipment/Supplies: Were home health services ordered? not applicable If so, what is the name of the agency?   Has the agency set up a time to come to the patient's home? not applicable Were any new equipment or medical supplies ordered?  No What is the name of the medical supply agency?  Were you able to get the supplies/equipment? not applicable Do you have any questions related to the use of the equipment or supplies? No  Functional Questionnaire: (I = Independent and D = Dependent) ADLs: I  Bathing/Dressing- I  Meal Prep- I  Eating- I  Maintaining continence- I  Transferring/Ambulation- I  Managing Meds- I  Follow up appointments reviewed:  PCP Hospital f/u appt confirmed? Yes  Scheduled to see Dr Lajuana Ripple on 01/07/22 at [facility] f/u appt confirmed? Yes  Tonia Ghent (Audiologist) on 01/04/22 at 9:00. Are transportation arrangements needed? No  If their condition worsens, is the pt aware to call PCP or go to the Emergency Dept.? Yes Was the patient provided with contact information for the PCP's office or ED? Yes Was to pt encouraged to call back with questions or concerns? Yes  SDOH assessments and interventions completed:   Yes  Care Coordination Interventions Activated:  Yes   Care Coordination Interventions:  Referred for  Care Coordination Services:  RN Care Coordinator   Encounter Outcome:  Pt. Visit Completed    Chong Sicilian, BSN, RN-BC RN Care Coordinator [place]: [phone] Main #: [phone]

## 2022-01-04 DIAGNOSIS — H9041 Sensorineural hearing loss, unilateral, right ear, with unrestricted hearing on the contralateral side: Secondary | ICD-10-CM | POA: Diagnosis not present

## 2022-01-07 ENCOUNTER — Ambulatory Visit (INDEPENDENT_AMBULATORY_CARE_PROVIDER_SITE_OTHER): Payer: Medicare HMO | Admitting: Family Medicine

## 2022-01-07 ENCOUNTER — Encounter: Payer: Self-pay | Admitting: Family Medicine

## 2022-01-07 VITALS — BP 133/73 | HR 60 | Temp 97.8°F | Ht 63.0 in | Wt 229.8 lb

## 2022-01-07 DIAGNOSIS — I671 Cerebral aneurysm, nonruptured: Secondary | ICD-10-CM

## 2022-01-07 DIAGNOSIS — R42 Dizziness and giddiness: Secondary | ICD-10-CM

## 2022-01-07 MED ORDER — DIAZEPAM 2 MG PO TABS: 2.0000 mg | ORAL_TABLET | Freq: Every day | ORAL | 0 refills | Status: DC | PRN

## 2022-01-07 NOTE — Progress Notes (Signed)
Subjective: CC:f/u hospital PCP: Amber Norlander, DO IDP:OEUMP [name] is a 71 y.o. female who is accompanied today's visit by her husband.  She is presenting to clinic today for:  1.  ER follow-up Patient was seen in the hospital for severe vertigo.  She had work-up to rule out any complications from her aneurysm.  She has seen her neurosurgeon and will be seeing ENT in about 2 weeks she has had 2 more spells since that discharge they have been refractory to meclizine.  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  She denies LOC.  The vertigo spells have been in various positions including lying down or sitting up in her chair.  One episode occurred while she was in the shower.    ROS: Per HPI  Allergies  Allergen Reactions   Aspartame Rash   Penicillins Itching and Rash    Did it involve swelling of the face/tongue/throat, SOB, or low BP? No Did it involve sudden or severe rash/hives, skin peeling, or any reaction on the inside of your mouth or nose? No Did you need to seek medical attention at a hospital or doctor's office? No When did it last happen?~25 years ago       If all above answers are "NO", may proceed with cephalosporin use.    Farxiga [Dapagliflozin]     UTIs   Metformin And Related     GI distress, dizziness   No [facility] Food Allergies     Honey-Rash Artificial Sweeteners-rash   Paxil [Paroxetine Hcl] Other (See Comments)    Twitching/feels like skin crawling   Prozac [Fluoxetine Hcl] Other (See Comments)    Twitching/feels like skin crawling   Zoloft [Sertraline Hcl] Other (See Comments)    Twitching/feels like skin crawling   Prednisone Palpitations   Ranexa [Ranolazine] Palpitations    Heart racing   Past Medical History:  Diagnosis Date   Allergy    Anxiety    Arthritis    Asthma    Coronary artery disease    Depression    Diabetes mellitus without complication (HCC)    Dyspnea     GERD (gastroesophageal reflux disease)    Hyperlipidemia    Hypertension    Pneumonia     Current Outpatient Medications:    albuterol (VENTOLIN HFA) 108 (90 Base) MCG/ACT inhaler, TAKE 2 PUFFS BY MOUTH EVERY 4 HOURS AS NEEDED FOR WHEEZE, Disp: 18 g, Rfl: 3   Alcohol Swabs (B-D SINGLE USE SWABS REGULAR) PADS, Apply topically., Disp: , Rfl:    amLODipine (NORVASC) 10 MG tablet, Take 1 tablet (10 mg total) by mouth daily., Disp: 90 tablet, Rfl: 3   aspirin EC 81 MG tablet, Take 81 mg by mouth in the morning., Disp: , Rfl:    atorvastatin (LIPITOR) 40 MG tablet, Take 1 tablet (40 mg total) by mouth at bedtime., Disp: 90 tablet, Rfl: 3   Blood Glucose Calibration (ACCU-CHEK AVIVA) SOLN, 2 Bottles by Other route as needed., Disp: , Rfl:    Budeson-Glycopyrrol-Formoterol (BREZTRI AEROSPHERE) 160-9-4.8 MCG/ACT AERO, Inhale 2 puffs into the lungs 2 (two) times daily., Disp: 32.1 g, Rfl: 11   budesonide (PULMICORT) 0.25 MG/2ML nebulizer solution, Take 2 mLs (0.25 mg total) by nebulization 2 (two) times daily. (Patient taking differently: Take 0.25 mg by nebulization 2 (two) times daily as needed (shortness of breath).), Disp: 120 mL, Rfl: 5   Calcium Carb-Cholecalciferol (CALCIUM 600+D3 PO), Take 1  tablet by mouth in the morning., Disp: , Rfl:    cetirizine (ZYRTEC ALLERGY) 10 MG tablet, Take 1 tablet (10 mg total) by mouth daily., Disp: 90 tablet, Rfl: 1   diphenhydrAMINE (BENADRYL) 25 MG tablet, Take 50 mg by mouth daily as needed for allergies., Disp: , Rfl:    fluticasone (FLONASE) 50 MCG/ACT nasal spray, Place 2 sprays into both nostrils daily., Disp: 16 g, Rfl: 6   furosemide (LASIX) 20 MG tablet, Take 1 tablet (20 mg total) by mouth as needed (based on weight - may take one extra tab for weight gain of 3lb in 24 hrs or 5lb in 1 week). (Patient taking differently: Take 20 mg by mouth daily as needed (based on weight - may take one extra tab for weight gain of 3lb in 24 hrs or 5lb in 1 week).),  Disp: , Rfl:    glucose blood test strip, TEST BLOOD SUGAR TWICE DAILY, Disp: , Rfl:    ibuprofen (ADVIL) 200 MG tablet, Take 400-600 mg by mouth daily as needed (pain (leg pain))., Disp: , Rfl:    insulin degludec (TRESIBA FLEXTOUCH) 200 UNIT/ML FlexTouch Pen, Inject 40 Units into the skin daily., Disp: 18 mL, Rfl: 0   Insulin Syringe-Needle U-100 31G X 5/16" 0.5 ML MISC, Use to inject Insulin four times daily: Use as directed; Dx: E11.9, E66.9, Disp: , Rfl:    ipratropium-albuterol (DUONEB) 0.5-2.5 (3) MG/3ML SOLN, TAKE 3 ML (1 VIAL) BY NEBULIZATION EVERY 6 HOURS AS NEEDED FOR WHEEZING., Disp: 360 mL, Rfl: 0   isosorbide mononitrate (IMDUR) 30 MG 24 hr tablet, Take 15 mg by mouth 2 (two) times daily., Disp: , Rfl:    lansoprazole (PREVACID) 15 MG capsule, Take 30 mg by mouth at bedtime., Disp: , Rfl:    lisinopril (ZESTRIL) 20 MG tablet, TAKE 1 TABLET BY MOUTH EVERY DAY, Disp: 90 tablet, Rfl: 2   magnesium oxide (MAG-OX) 400 MG tablet, Take magnesium on days you take lasix (Patient taking differently: Take 400 mg by mouth daily.), Disp: 60 tablet, Rfl: 3   meclizine (ANTIVERT) 25 MG tablet, Take 1 tablet (25 mg total) by mouth 3 (three) times daily as needed for up to 30 doses for dizziness., Disp: 30 tablet, Rfl: 1   metoprolol tartrate (LOPRESSOR) 25 MG tablet, TAKE 1 TABLET BY MOUTH TWICE A DAY, Disp: 180 tablet, Rfl: 0   montelukast (SINGULAIR) 10 MG tablet, Take 1 tablet (10 mg total) by mouth at bedtime., Disp: 90 tablet, Rfl: 3   nitroGLYCERIN (NITROSTAT) 0.4 MG SL tablet, Place 1 tablet (0.4 mg total) under the tongue every 5 (five) minutes x 3 doses as needed for chest pain (if no relief after 2nd dose, proceed to ED or call 911)., Disp: 25 tablet, Rfl: 3   pioglitazone (ACTOS) 30 MG tablet, Take 15 mg by mouth daily., Disp: , Rfl:    potassium chloride (KLOR-CON M10) 10 MEQ tablet, TAKE 1 TABLET (10 MEQ TOTAL) BY MOUTH EVERY EVENING., Disp: 90 tablet, Rfl: 1   ticagrelor (BRILINTA) 90 MG  TABS tablet, Take 90 mg by mouth 2 (two) times daily., Disp: , Rfl:    HYDROcodone-acetaminophen (NORCO/VICODIN) 5-325 MG tablet, Take 1 tablet by mouth every 4 (four) hours as needed for moderate pain. (Patient not taking: Reported on 12/29/2021), Disp: 30 tablet, Rfl: 0  Current Facility-Administered Medications:    sodium chloride flush (NS) 0.9 % injection 3 mL, 3 mL, Intravenous, Q12H, Branch, Alphonse Guild, MD Social History   Socioeconomic History  Marital status: Married    Spouse name: Fritz Pickerel   Number of children: 3   Years of education: Not on file   Highest education level: Not on file  Occupational History   Occupation: retired  Tobacco Use   Smoking status: Never   Smokeless tobacco: Never  Vaping Use   Vaping Use: Never used  Substance and Sexual Activity   Alcohol use: Never   Drug use: Never   Sexual activity: Not Currently  Other Topics Concern   Not on file  Social History Narrative   Resides at home with her husband.  She is originally from [place] but relocated to [place] and then to [place] in August 2020.  She has a son who lives about 2 hours away in [place] and a sister-in-law who lives in [place].   2 children in [place] Determinants of Health   Financial Resource Strain: Low Risk  (11/29/2021)   Overall Financial Resource Strain (CARDIA)    Difficulty of Paying Living Expenses: Not hard at all  Food Insecurity: No Food Insecurity (07/20/2021)   Hunger Vital Sign    Worried About Running Out of Food in the Last Year: Never true    Ran Out of Food in the Last Year: Never true  Transportation Needs: No Transportation Needs (01/03/2022)   PRAPARE - Hydrologist (Medical): No    Lack of Transportation (Non-Medical): No  Physical Activity: Inactive (07/20/2021)   Exercise Vital Sign    Days of Exercise per Week: 0 days    Minutes of Exercise per Session: 0 min  Stress: No Stress Concern Present (07/20/2021)    [place]    Feeling of Stress : Only a little  Social Connections: Moderately Isolated (07/20/2021)   Social Connection and Isolation Panel [NHANES]    Frequency of Communication with Friends and Family: More than three times a week    Frequency of Social Gatherings with Friends and Family: Once a week    Attends Religious Services: Never    Marine scientist or Organizations: No    Attends Archivist Meetings: Never    Marital Status: Married  Human resources officer Violence: Not At Risk (07/20/2021)   Humiliation, Afraid, Rape, and Kick questionnaire    Fear of Current or Ex-Partner: No    Emotionally Abused: No    Physically Abused: No    Sexually Abused: No   Family History  Problem Relation Age of Onset   Heart disease Mother    Stroke Mother    Hypertension Mother    Cancer Father    Stroke Father     Objective: Office vital signs reviewed. BP 133/73   Pulse 60   Temp 97.8 F (36.6 C)   Ht 5' 3"  (1.6 m)   Wt 229 lb 12.8 oz (104.2 kg)   SpO2 97%   BMI 40.71 kg/m   Physical Examination:  General: Awake, alert, morbidly obese, No acute distress HEENT:EOMI, sclera white, MMM Cardio: regular rate and rhythm, S1S2 heard, no murmurs appreciated Pulm: clear to auscultation bilaterally, no wheezes, rhonchi or rales; normal work of breathing on room air Neuro: hard of hearing on right.  Assessment/ Plan: 71 y.o. female   Brain aneurysm  Vertigo - Plan: diazepam (VALIUM) 2 MG tablet  I reviewed her hospital evaluation.  Keep appointments with neurosurgery, ENT.  I am going to trial her on low-dose Valium as  needed vertiginous spells.  We discussed the risks of this medication.  If we continue this going forward we will plan for UDS and CSC at next visit.  Going to reassess her again in 30 days an appointment has been scheduled  The Narcotic Database has been reviewed.  There were no red flags.      No orders of the defined types were placed in this encounter.  No orders of the defined types were placed in this encounter.    Amber Norlander, DO [place] [phone]

## 2022-01-11 ENCOUNTER — Ambulatory Visit: Payer: Self-pay | Admitting: *Deleted

## 2022-01-11 NOTE — Patient Outreach (Unsigned)
  Care Coordination   Initial Visit Note   01/11/2022 Name: Amber Reeves MRN: [id number] DOB: 10/11/1950  Amber Reeves is a 71 y.o. year old female who sees Amber Norlander, DO for primary care. I spoke with  Amber Reeves by phone today.  What matters to the patients health and wellness today?  Left Arm (wrist, elbow, shoulder) "ache" History of an elbow fracture & recent left shoulder pulled out of place when she went in to get care for aneurysm  She reports this is decreasing her ability to prepare for her holiday meal Some assistance from her husband   Neurology will to be seen on 02/10/22 Discussed blood thinner cause bruising Has not tried heat or cold compresses Hydrocodone was used to relieve the pain during her [place] hospital visit  Diabetes  has been with in normal limits A1C 11/17/21 6.1 She thought her vertigo was related to diabetes but ruled out  Vertigo-still has hearing issues "gotten worst" saw audiologist & new hearing aides will be received after Christmas (Amber Reeves)  Only taking valium if she has vertigo episodes  Hypertension better She use to be    Goals Addressed               This Visit's Progress     Patient Stated     manage shoulder pain (THN) (pt-stated)   Not on track     Care Coordination Interventions: Discussed importance of adherence to all scheduled medical appointments Counseled on the importance of reporting any/all new or changed pain symptoms or management strategies to pain management provider Discussed use of relaxation techniques and/or diversional activities to assist with pain reduction (distraction, imagery, relaxation, massage, acupressure, TENS, heat, and cold application Reviewed with patient prescribed pharmacological and nonpharmacological pain relief strategies Screening for signs and symptoms of depression related to chronic disease state  Assessed social determinant of health barriers Outreach  to [employer] per secure chat about patient voiced concerns         SDOH assessments and interventions completed:  Yes{THN Tip this will not be part of the note when signed-REQUIRED REPORT FIELD DO NOT DELETE (Optional):27901}  SDOH Interventions Today    Flowsheet Row Most Recent Value  SDOH Interventions   Food Insecurity Interventions Intervention Not Indicated  Transportation Interventions Intervention Not Indicated        Care Coordination Interventions Activated:  Yes {THN Tip this will not be part of the note when signed-REQUIRED REPORT FIELD DO NOT DELETE (Optional):27901} Care Coordination Interventions:  Yes, provided {THN Tip this will not be part of the note when signed-REQUIRED REPORT FIELD DO NOT DELETE (Optional):27901}  Follow up plan: Follow up call scheduled for ***   Encounter Outcome:  Pt. Visit Completed {THN Tip this will not be part of the note when signed-REQUIRED REPORT FIELD DO NOT DELETE (Optional):27901}   L. Lavina Hamman, RN, BSN, [place] Coordinator Office number [phone]

## 2022-01-13 ENCOUNTER — Other Ambulatory Visit: Payer: Self-pay | Admitting: Family Medicine

## 2022-01-17 DIAGNOSIS — R42 Dizziness and giddiness: Secondary | ICD-10-CM | POA: Insufficient documentation

## 2022-01-17 DIAGNOSIS — H905 Unspecified sensorineural hearing loss: Secondary | ICD-10-CM | POA: Insufficient documentation

## 2022-01-17 DIAGNOSIS — H9041 Sensorineural hearing loss, unilateral, right ear, with unrestricted hearing on the contralateral side: Secondary | ICD-10-CM | POA: Diagnosis not present

## 2022-01-17 DIAGNOSIS — H8101 Meniere's disease, right ear: Secondary | ICD-10-CM | POA: Insufficient documentation

## 2022-01-28 ENCOUNTER — Telehealth: Payer: Medicare HMO

## 2022-01-28 ENCOUNTER — Telehealth: Payer: Self-pay | Admitting: Pharmacist

## 2022-01-28 NOTE — Telephone Encounter (Signed)
Please f/u to re-enroll patient with [facility] /novo PAP 2024 Patient aware to check mail

## 2022-02-07 DIAGNOSIS — I671 Cerebral aneurysm, nonruptured: Secondary | ICD-10-CM | POA: Diagnosis not present

## 2022-02-11 ENCOUNTER — Other Ambulatory Visit: Payer: Self-pay | Admitting: Family Medicine

## 2022-02-11 ENCOUNTER — Ambulatory Visit (INDEPENDENT_AMBULATORY_CARE_PROVIDER_SITE_OTHER): Payer: Medicare HMO | Admitting: Family Medicine

## 2022-02-11 VITALS — BP 107/69 | HR 53 | Temp 98.6°F | Ht 63.0 in | Wt 226.0 lb

## 2022-02-11 DIAGNOSIS — J45909 Unspecified asthma, uncomplicated: Secondary | ICD-10-CM | POA: Insufficient documentation

## 2022-02-11 DIAGNOSIS — I25119 Atherosclerotic heart disease of native coronary artery with unspecified angina pectoris: Secondary | ICD-10-CM

## 2022-02-11 DIAGNOSIS — J4550 Severe persistent asthma, uncomplicated: Secondary | ICD-10-CM | POA: Diagnosis not present

## 2022-02-11 DIAGNOSIS — G479 Sleep disorder, unspecified: Secondary | ICD-10-CM

## 2022-02-11 DIAGNOSIS — R42 Dizziness and giddiness: Secondary | ICD-10-CM

## 2022-02-11 DIAGNOSIS — I671 Cerebral aneurysm, nonruptured: Secondary | ICD-10-CM | POA: Diagnosis not present

## 2022-02-11 DIAGNOSIS — F329 Major depressive disorder, single episode, unspecified: Secondary | ICD-10-CM

## 2022-02-11 MED ORDER — ATORVASTATIN CALCIUM 40 MG PO TABS: 40.0000 mg | ORAL_TABLET | Freq: Every day | ORAL | 3 refills | Status: DC

## 2022-02-11 MED ORDER — MIRTAZAPINE 7.5 MG PO TABS: 7.5000 mg | ORAL_TABLET | Freq: Every day | ORAL | 3 refills | Status: DC

## 2022-02-11 NOTE — Telephone Encounter (Signed)
Ok to refill? Historical provider

## 2022-02-11 NOTE — Patient Instructions (Signed)
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 It causes recurrent attacks of a spinning sensation (vertigo), and ringing in the ear (tinnitus). It also causes hearing loss and a feeling of fullness or pressure in the ear. It typically affects one ear but can affect both. This is a lifelong condition, and it may get worse over time. There are treatment options to help manage the symptoms of Mnire's disease. What are the causes? This condition is caused by having too much of the fluid that is in the inner ear (endolymph). When fluid builds up in the inner ear, it affects the nerves that control balance and hearing. The reason for the fluid buildup is not known. Possible causes include: Allergies. An abnormal reaction of the body's defense system (autoimmune disease). Viral infection of the inner ear. Head injury. What increases the risk? The following factors may make you more likely to develop this condition: Being between 43 and 12 years old. Having a family history of Mnire's disease. Having a history of autoimmune disease. Having an injury to the head (head trauma). What are the signs or symptoms? Symptoms of this condition include: Fullness and pressure in your ear. Roaring or ringing in your ear (tinnitus). Vertigo and loss of balance. Decreased hearing. Nausea and vomiting. This is rare. Symptoms of this condition can come and go and may last for up to 4 or more hours at a time. Symptoms usually start in one ear. They may become more frequent and eventually involve both ears. How is this diagnosed? This condition is diagnosed based on a physical exam and tests. Tests may include: A hearing test (audiogram). An electronystagmogram (ENG). This tests your balance nerve (vestibular nerve). Imaging studies of your inner ear and hearing nerve, such as an MRI. Other balance tests, such as rotational or balance platform tests. How is this treated? There is no cure for  this condition, but treatment can help to manage your symptoms. Treatment may include: A low-salt (low-sodium) diet. This may help to reduce fluid in the body and relieve symptoms. Oral or injected medicines to reduce or control: Vertigo. Nausea. Fluid retention. Use of an air pressure pulse generator. This is a machine that sends small pressure pulses into your ear canal. Injections through the ear drum (tympanic membrane) to control vertigo symptoms. Hearing aids. Inner ear surgery. This is rare. Follow these instructions at home: Eating and drinking Avoid caffeine. Do not drink alcohol. Drink enough fluid to keep your urine pale yellow. Limit the sodium in your diet as told by your health care provider. This is usually no more than 1,500-2,000 mg per day. Check ingredients and nutrition facts on packaged foods and beverages. General instructions Take over-the-counter and prescription medicines only as told by your health care provider. Do not drive if you have vertigo or dizziness. Do not use any products that contain nicotine or tobacco. These products include cigarettes, chewing tobacco, and vaping devices, such as e-cigarettes. If you need help quitting, ask your health care provider. Keep all follow-up visits. This is important. Where to find more information To find more information, advice, and guidance, please see these online sites: American Academy of Otolaryngology-Head and Neck Surgery Foundation: www.enthealth.[place] on Deafness and Other Communication Disorders: FightListings.se [employer]: www.american-hearing.org Contact a health care provider if: You have symptoms that last longer than 4 hours. You have new or worse symptoms. Get help right away if: You have been vomiting for 24 hours. You cannot  keep fluids down. You have chest pain or trouble breathing. These symptoms may represent a serious problem that is an emergency.  Do not wait to see if the symptoms will go away. Get medical help right away. Call your local emergency services (911 in the [place] Do not drive yourself to the hospital. 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 This condition causes recurrent attacks of a spinning sensation (vertigo), ringing in the ear (tinnitus), hearing loss, and ear fullness. Symptoms of this condition can come and go and may last for up to 4 or more hours at a time. Mnire's disease can be treated with a low-sodium diet, medicines, and sometimes, surgery. This information is not intended to replace advice given to you by your health care provider. Make sure you discuss any questions you have with your health care provider. Document Revised: 01/13/2020 Document Reviewed: 01/13/2020 Elsevier Patient Education  [place].

## 2022-02-11 NOTE — Progress Notes (Signed)
Subjective: CC: Follow-up vertigo PCP: Janora Norlander, DO DVV:OHYWV Amber Reeves is a 71 y.o. female presenting to clinic today for:  1.  Vertigo Patient saw her specialist on 27 November and she was started on triamterene-hydrochlorothiazide.  She really has not noticed a huge difference in the vertigo but has noticed some improvement in the fluid on her legs and has subsequently lost a few pounds.  She never did start the Valium because she was very afraid of becoming dependent on a benzodiazepine again.  She is previously had issues with Xanax and did not want to have that issue again.  She has follow-up with her specialist and just a few days.  Neurology appointment will be sometime in March.  2.  Sleep difficulties Patient reports difficulties with sleep.  She often finds it difficult to fall asleep and stay asleep.  Again does not want to utilize the benzodiazepine for this.  Is not currently on any antidepressants or sleep aids except for over-the-counter Benadryl of which she takes several..   ROS: Per HPI  Allergies  Allergen Reactions   Aspartame Rash   Penicillins Itching and Rash    Did it involve swelling of the face/tongue/throat, SOB, or low BP? No Did it involve sudden or severe rash/hives, skin peeling, or any reaction on the inside of your mouth or nose? No Did you need to seek medical attention at a hospital or doctor's office? No When did it last happen?~25 years ago       If all above answers are "NO", may proceed with cephalosporin use.    Wilder Glade [Dapagliflozin]     UTIs   Metformin And Related     GI distress, dizziness   No [facility] Food Allergies Other (See Comments)    Honey-Rash Artificial Sweeteners-rash Dextrose "measles like symptoms" Beet sugar "measles like symptoms"   Paxil [Paroxetine Hcl] Other (See Comments)    Twitching/feels like skin crawling   Prozac [Fluoxetine Hcl] Other (See Comments)    Twitching/feels like skin crawling    Zoloft [Sertraline Hcl] Other (See Comments)    Twitching/feels like skin crawling   Prednisone Palpitations   Ranexa [Ranolazine] Palpitations    Heart racing   Past Medical History:  Diagnosis Date   Allergy    Anxiety    Arthritis    Asthma    Coronary artery disease    Depression    Diabetes mellitus without complication (HCC)    Dyspnea    GERD (gastroesophageal reflux disease)    Hyperlipidemia    Hypertension    Pneumonia     Current Outpatient Medications:    albuterol (VENTOLIN HFA) 108 (90 Base) MCG/ACT inhaler, TAKE 2 PUFFS BY MOUTH EVERY 4 HOURS AS NEEDED FOR WHEEZE, Disp: 18 g, Rfl: 3   Alcohol Swabs (B-D SINGLE USE SWABS REGULAR) PADS, Apply topically., Disp: , Rfl:    amLODipine (NORVASC) 10 MG tablet, Take 1 tablet (10 mg total) by mouth daily., Disp: 90 tablet, Rfl: 3   aspirin EC 81 MG tablet, Take 81 mg by mouth in the morning., Disp: , Rfl:    atorvastatin (LIPITOR) 40 MG tablet, Take 1 tablet (40 mg total) by mouth at bedtime., Disp: 90 tablet, Rfl: 3   Blood Glucose Calibration (ACCU-CHEK AVIVA) SOLN, 2 Bottles by Other route as needed., Disp: , Rfl:    Budeson-Glycopyrrol-Formoterol (BREZTRI AEROSPHERE) 160-9-4.8 MCG/ACT AERO, Inhale 2 puffs into the lungs 2 (two) times daily., Disp: 32.1 g, Rfl: 11   budesonide (  PULMICORT) 0.25 MG/2ML nebulizer solution, Take 2 mLs (0.25 mg total) by nebulization 2 (two) times daily. (Patient taking differently: Take 0.25 mg by nebulization 2 (two) times daily as needed (shortness of breath).), Disp: 120 mL, Rfl: 5   Calcium Carb-Cholecalciferol (CALCIUM 600+D3 PO), Take 1 tablet by mouth in the morning., Disp: , Rfl:    cetirizine (ZYRTEC ALLERGY) 10 MG tablet, Take 1 tablet (10 mg total) by mouth daily., Disp: 90 tablet, Rfl: 1   diazepam (VALIUM) 2 MG tablet, Take 1 tablet (2 mg total) by mouth daily as needed (vertigo that is not relieved by meclizine)., Disp: 30 tablet, Rfl: 0   diphenhydrAMINE (BENADRYL) 25 MG tablet,  Take 50 mg by mouth daily as needed for allergies., Disp: , Rfl:    fluticasone (FLONASE) 50 MCG/ACT nasal spray, Place 2 sprays into both nostrils daily., Disp: 16 g, Rfl: 6   furosemide (LASIX) 20 MG tablet, Take 1 tablet (20 mg total) by mouth as needed (based on weight - may take one extra tab for weight gain of 3lb in 24 hrs or 5lb in 1 week). (Patient taking differently: Take 20 mg by mouth daily as needed (based on weight - may take one extra tab for weight gain of 3lb in 24 hrs or 5lb in 1 week).), Disp: , Rfl:    glucose blood test strip, TEST BLOOD SUGAR TWICE DAILY, Disp: , Rfl:    ibuprofen (ADVIL) 200 MG tablet, Take 400-600 mg by mouth daily as needed (pain (leg pain))., Disp: , Rfl:    insulin degludec (TRESIBA FLEXTOUCH) 200 UNIT/ML FlexTouch Pen, Inject 40 Units into the skin daily., Disp: 18 mL, Rfl: 0   Insulin Syringe-Needle U-100 31G X 5/16" 0.5 ML MISC, Use to inject Insulin four times daily: Use as directed; Dx: E11.9, E66.9, Disp: , Rfl:    ipratropium-albuterol (DUONEB) 0.5-2.5 (3) MG/3ML SOLN, TAKE 3 ML (1 VIAL) BY NEBULIZATION EVERY 6 HOURS AS NEEDED FOR WHEEZING., Disp: 360 mL, Rfl: 0   isosorbide mononitrate (IMDUR) 30 MG 24 hr tablet, Take 15 mg by mouth 2 (two) times daily., Disp: , Rfl:    lansoprazole (PREVACID) 15 MG capsule, Take 30 mg by mouth at bedtime., Disp: , Rfl:    lisinopril (ZESTRIL) 20 MG tablet, TAKE 1 TABLET BY MOUTH EVERY DAY, Disp: 90 tablet, Rfl: 2   magnesium oxide (MAG-OX) 400 MG tablet, Take magnesium on days you take lasix (Patient taking differently: Take 400 mg by mouth daily.), Disp: 60 tablet, Rfl: 3   meclizine (ANTIVERT) 25 MG tablet, Take 1 tablet (25 mg total) by mouth 3 (three) times daily as needed for up to 30 doses for dizziness., Disp: 30 tablet, Rfl: 1   metoprolol tartrate (LOPRESSOR) 25 MG tablet, TAKE 1 TABLET BY MOUTH TWICE A DAY, Disp: 180 tablet, Rfl: 0   montelukast (SINGULAIR) 10 MG tablet, TAKE 1 TABLET BY MOUTH EVERYDAY AT  BEDTIME, Disp: 90 tablet, Rfl: 1   nitroGLYCERIN (NITROSTAT) 0.4 MG SL tablet, Place 1 tablet (0.4 mg total) under the tongue every 5 (five) minutes x 3 doses as needed for chest pain (if no relief after 2nd dose, proceed to ED or call 911)., Disp: 25 tablet, Rfl: 3   pioglitazone (ACTOS) 30 MG tablet, Take 15 mg by mouth daily., Disp: , Rfl:    potassium chloride (KLOR-CON M10) 10 MEQ tablet, TAKE 1 TABLET (10 MEQ TOTAL) BY MOUTH EVERY EVENING., Disp: 90 tablet, Rfl: 1   ticagrelor (BRILINTA) 90 MG TABS  tablet, Take 90 mg by mouth 2 (two) times daily., Disp: , Rfl:   Current Facility-Administered Medications:    sodium chloride flush (NS) 0.9 % injection 3 mL, 3 mL, Intravenous, Q12H, Branch, Alphonse Guild, MD Social History   Socioeconomic History   Marital status: Married    Spouse name: Fritz Pickerel   Number of children: 3   Years of education: Not on file   Highest education level: Not on file  Occupational History   Occupation: retired  Tobacco Use   Smoking status: Never   Smokeless tobacco: Never  Vaping Use   Vaping Use: Never used  Substance and Sexual Activity   Alcohol use: Never   Drug use: Never   Sexual activity: Not Currently  Other Topics Concern   Not on file  Social History Narrative   Resides at home with her husband.  She is originally from [place] but relocated to [place] and then to [place] in August 2020.  She has a son who lives about 2 hours away in [place] and a sister-in-law who lives in [place].   2 children in [place] Determinants of Health   Financial Resource Strain: Low Risk  (11/29/2021)   Overall Financial Resource Strain (CARDIA)    Difficulty of Paying Living Expenses: Not hard at all  Food Insecurity: No Food Insecurity (01/11/2022)   Hunger Vital Sign    Worried About Running Out of Food in the Last Year: Never true    Ran Out of Food in the Last Year: Never true  Transportation Needs: No Transportation Needs (01/11/2022)    PRAPARE - Hydrologist (Medical): No    Lack of Transportation (Non-Medical): No  Physical Activity: Inactive (07/20/2021)   Exercise Vital Sign    Days of Exercise per Week: 0 days    Minutes of Exercise per Session: 0 min  Stress: No Stress Concern Present (07/20/2021)   [place]    Feeling of Stress : Only a little  Social Connections: Moderately Isolated (07/20/2021)   Social Connection and Isolation Panel [NHANES]    Frequency of Communication with Friends and Family: More than three times a week    Frequency of Social Gatherings with Friends and Family: Once a week    Attends Religious Services: Never    Marine scientist or Organizations: No    Attends Archivist Meetings: Never    Marital Status: Married  Human resources officer Violence: Not At Risk (07/20/2021)   Humiliation, Afraid, Rape, and Kick questionnaire    Fear of Current or Ex-Partner: No    Emotionally Abused: No    Physically Abused: No    Sexually Abused: No   Family History  Problem Relation Age of Onset   Heart disease Mother    Stroke Mother    Hypertension Mother    Cancer Father    Stroke Father     Objective: Office vital signs reviewed. BP 107/69   Pulse (!) 53   Temp 98.6 F (37 C)   Ht 5' 3"  (1.6 m)   Wt 226 lb (102.5 kg)   SpO2 99%   BMI 40.03 kg/m   Physical Examination:  General: Awake, alert, well nourished, No acute distress HEENT: no nystagmus Cardio: Bradycardic with regular rhythm, S1S2 heard, no murmurs appreciated Pulm: clear to auscultation bilaterally, no wheezes, rhonchi or rales; normal work of breathing on room air Neuro: Oriented.  Follows  commands Psych: Mood stable, speech normal     02/11/2022    9:09 AM 01/07/2022    9:10 AM 08/30/2021   12:58 PM  Depression screen PHQ 2/9  Decreased Interest 3 2 2   Down, Depressed, Hopeless 2 2 1   PHQ - 2 Score 5 4 3    Altered sleeping 3 3 1   Tired, decreased energy 3 1 0  Change in appetite 0 0 0  Feeling bad or failure about yourself  3 0 1  Trouble concentrating 0 0 0  Moving slowly or fidgety/restless 0 0 0  Suicidal thoughts 0 0 0  PHQ-9 Score 14 8 5   Difficult doing work/chores Not difficult at all Not difficult at all Not difficult at all      02/11/2022    9:10 AM 01/07/2022    9:10 AM 08/30/2021   12:59 PM 06/29/2021   12:40 PM  GAD 7 : Generalized Anxiety Score  Nervous, Anxious, on Edge 1 1 2  0  Control/stop worrying 2 2 2 1   Worry too much - different things 2 2 0 0  Trouble relaxing 2 2 2  0  Restless 1 1 0 0  Easily annoyed or irritable 0 0 0 1  Afraid - awful might happen 0 1 0 1  Total GAD 7 Score 8 9 6 3   Anxiety Difficulty Somewhat difficult Not difficult at all Not difficult at all Not difficult at all     Assessment/ Plan: 71 y.o. 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  For now she is going to wait and see what the specialist says next week and decide whether or not she will stay on the Dyazide versus pursue alternative means of treatment for what sounds like Mnire's disease.  I do think that if she is to stay on the triamterene-hydrochlorothiazide I would like to reduce her amlodipine as her blood pressure was borderline low today and I certainly would not want to exacerbate low blood pressure/dizziness.  In the meantime, she did want to start something for sleep and mood so we will start mirtazapine at 7.5 mg nightly.  We will reassess her in the next couple of  months, sooner if concerns arise.  Did not discuss CAD today but Lipitor was renewed.  No orders of the defined types were placed in this encounter.  No orders of the defined types were placed in this encounter.    Janora Norlander, DO [facility] [phone]

## 2022-02-17 ENCOUNTER — Other Ambulatory Visit: Payer: Self-pay | Admitting: Family Medicine

## 2022-02-17 DIAGNOSIS — E1159 Type 2 diabetes mellitus with other circulatory complications: Secondary | ICD-10-CM

## 2022-02-17 DIAGNOSIS — I25119 Atherosclerotic heart disease of native coronary artery with unspecified angina pectoris: Secondary | ICD-10-CM

## 2022-02-18 ENCOUNTER — Other Ambulatory Visit: Payer: Self-pay | Admitting: Family

## 2022-02-18 DIAGNOSIS — H6993 Unspecified Eustachian tube disorder, bilateral: Secondary | ICD-10-CM

## 2022-02-24 DIAGNOSIS — R42 Dizziness and giddiness: Secondary | ICD-10-CM | POA: Diagnosis not present

## 2022-02-25 ENCOUNTER — Ambulatory Visit: Payer: Medicare HMO | Admitting: Physician Assistant

## 2022-03-02 ENCOUNTER — Telehealth: Payer: Self-pay | Admitting: Pharmacist

## 2022-03-02 NOTE — Telephone Encounter (Signed)
Please enroll for breztri patient assistance [employer]  Thanks!

## 2022-03-03 ENCOUNTER — Telehealth: Payer: Self-pay | Admitting: Family Medicine

## 2022-03-03 NOTE — Telephone Encounter (Signed)
Who was the letter from?

## 2022-03-03 NOTE — Telephone Encounter (Signed)
Patient states she got a letter from patient assistance that she was denied. Patient stated to let julie know.

## 2022-03-04 ENCOUNTER — Other Ambulatory Visit: Payer: Self-pay | Admitting: Cardiology

## 2022-03-13 IMAGING — MG DIGITAL SCREENING BILAT W/ CAD
4 series · 4 of 4 positions shown · non-contrast
Comparison: Previous exam(s).

CLINICAL DATA: Screening.

EXAM:
DIGITAL SCREENING BILATERAL MAMMOGRAM WITH CAD
TECHNIQUE: Bilateral screening digital craniocaudal and mediolateral oblique
mammograms were obtained. The images were evaluated with
computer-aided detection.

[R MLO]
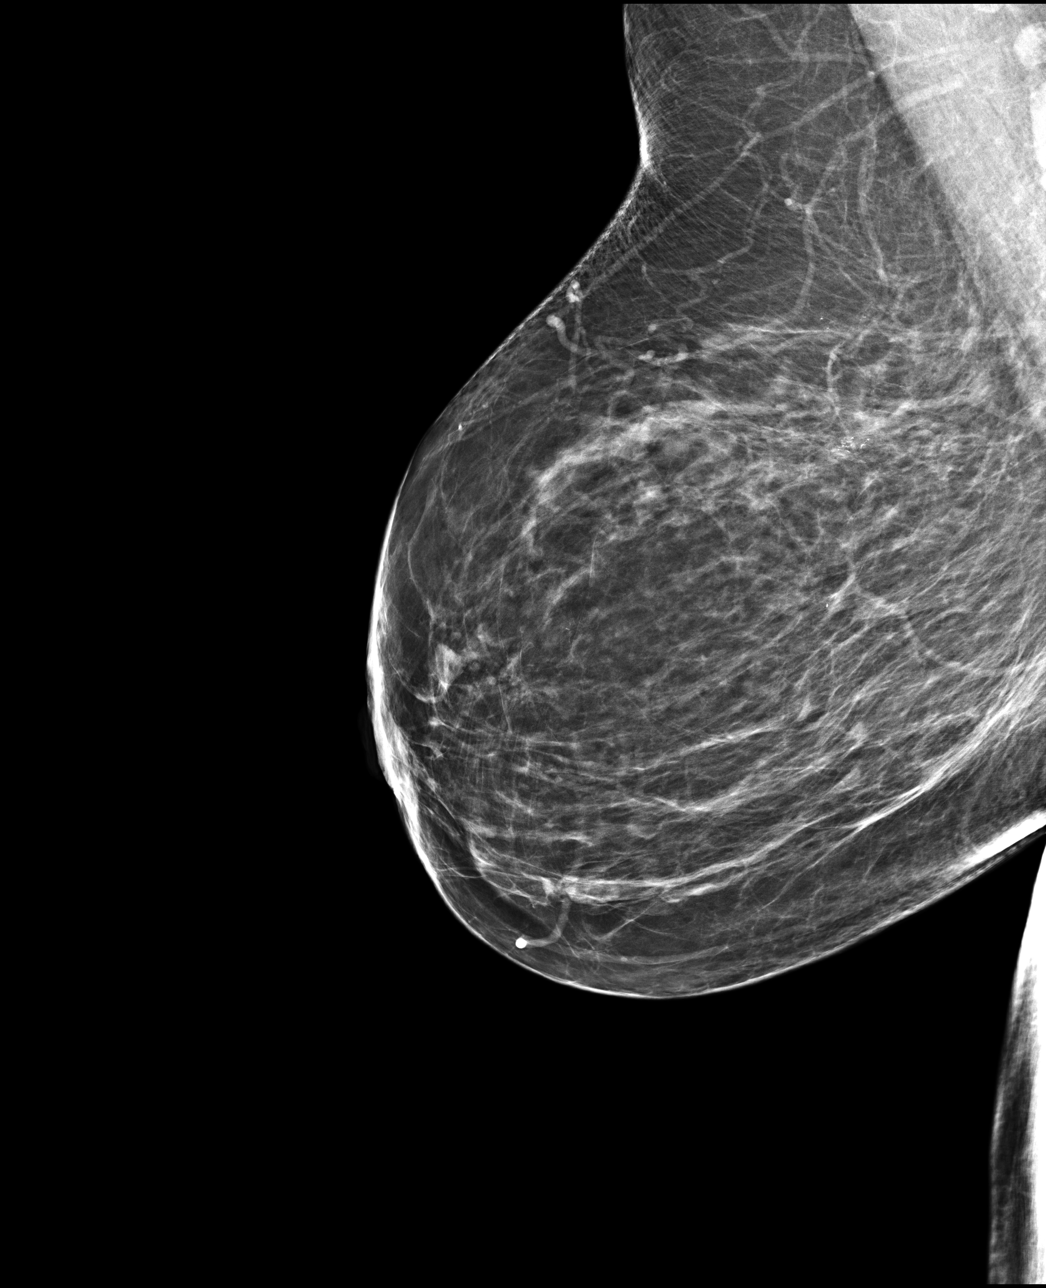

[L MLO]
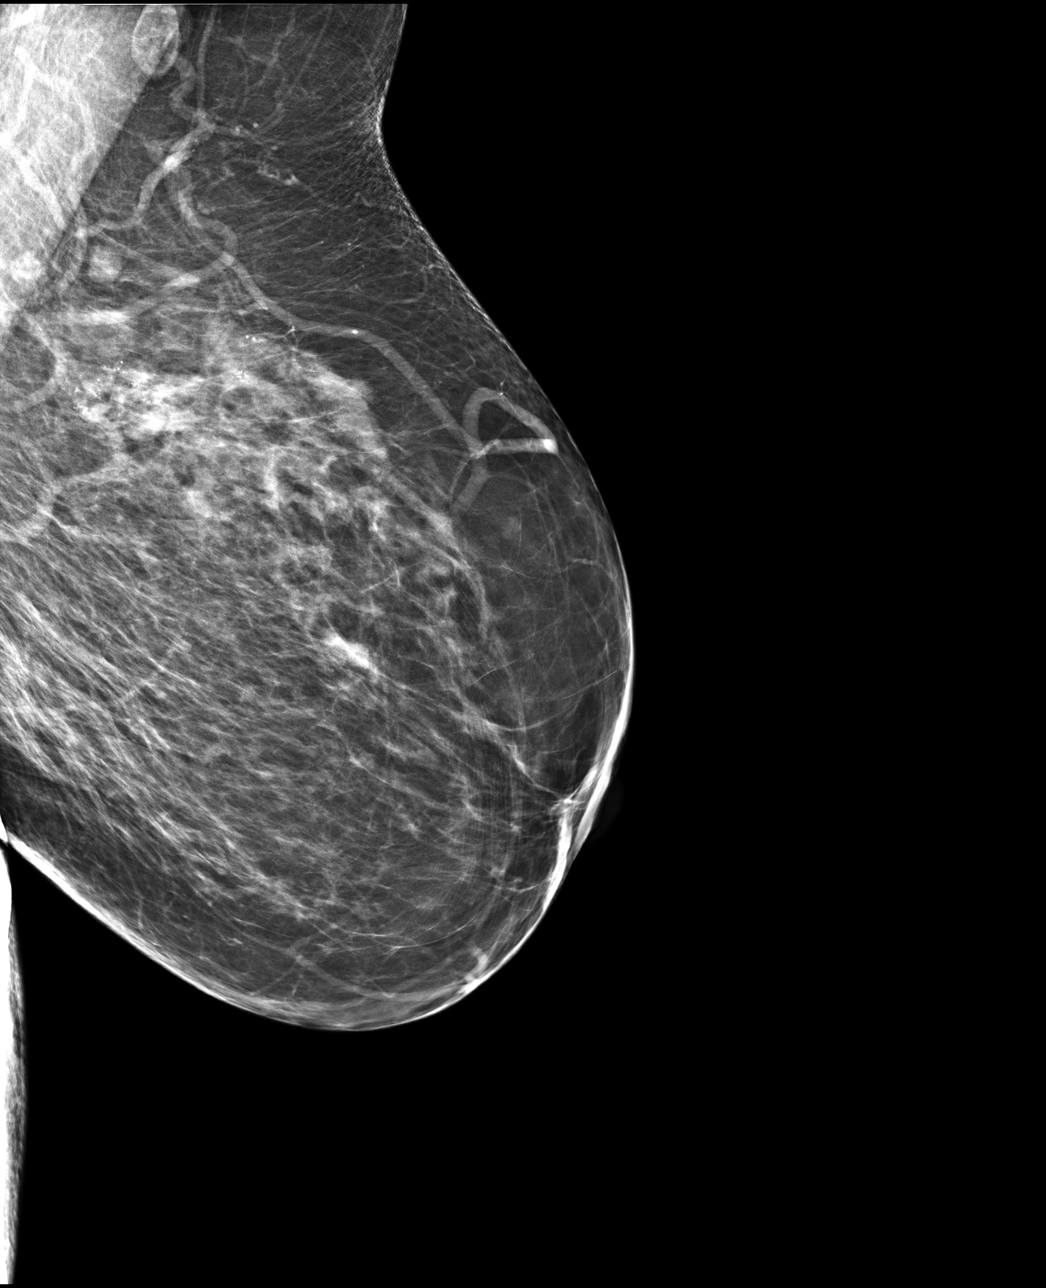

[L CC]
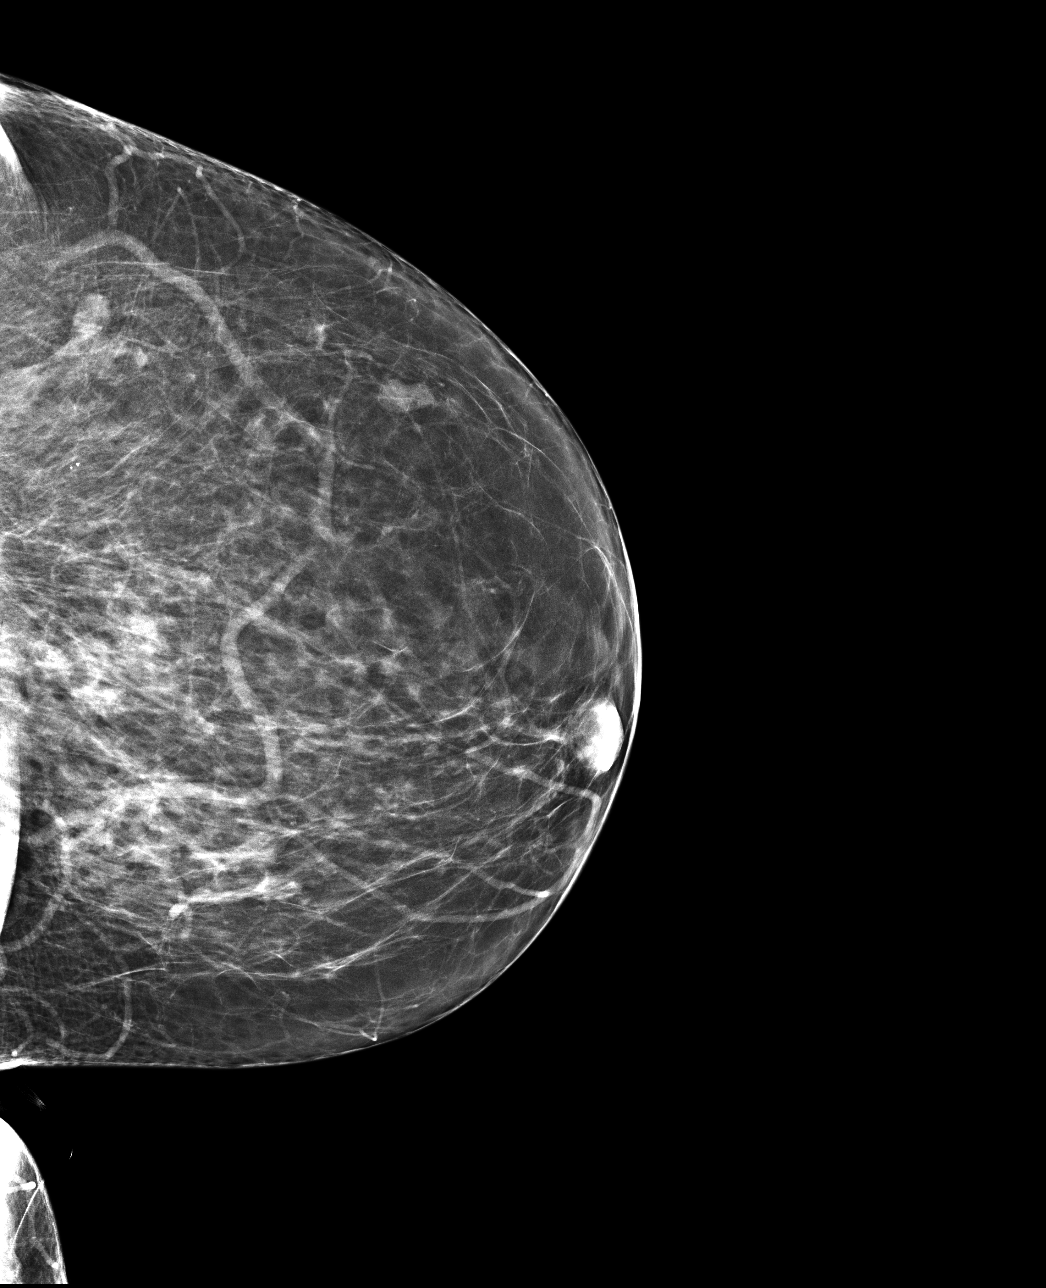

[R CC]
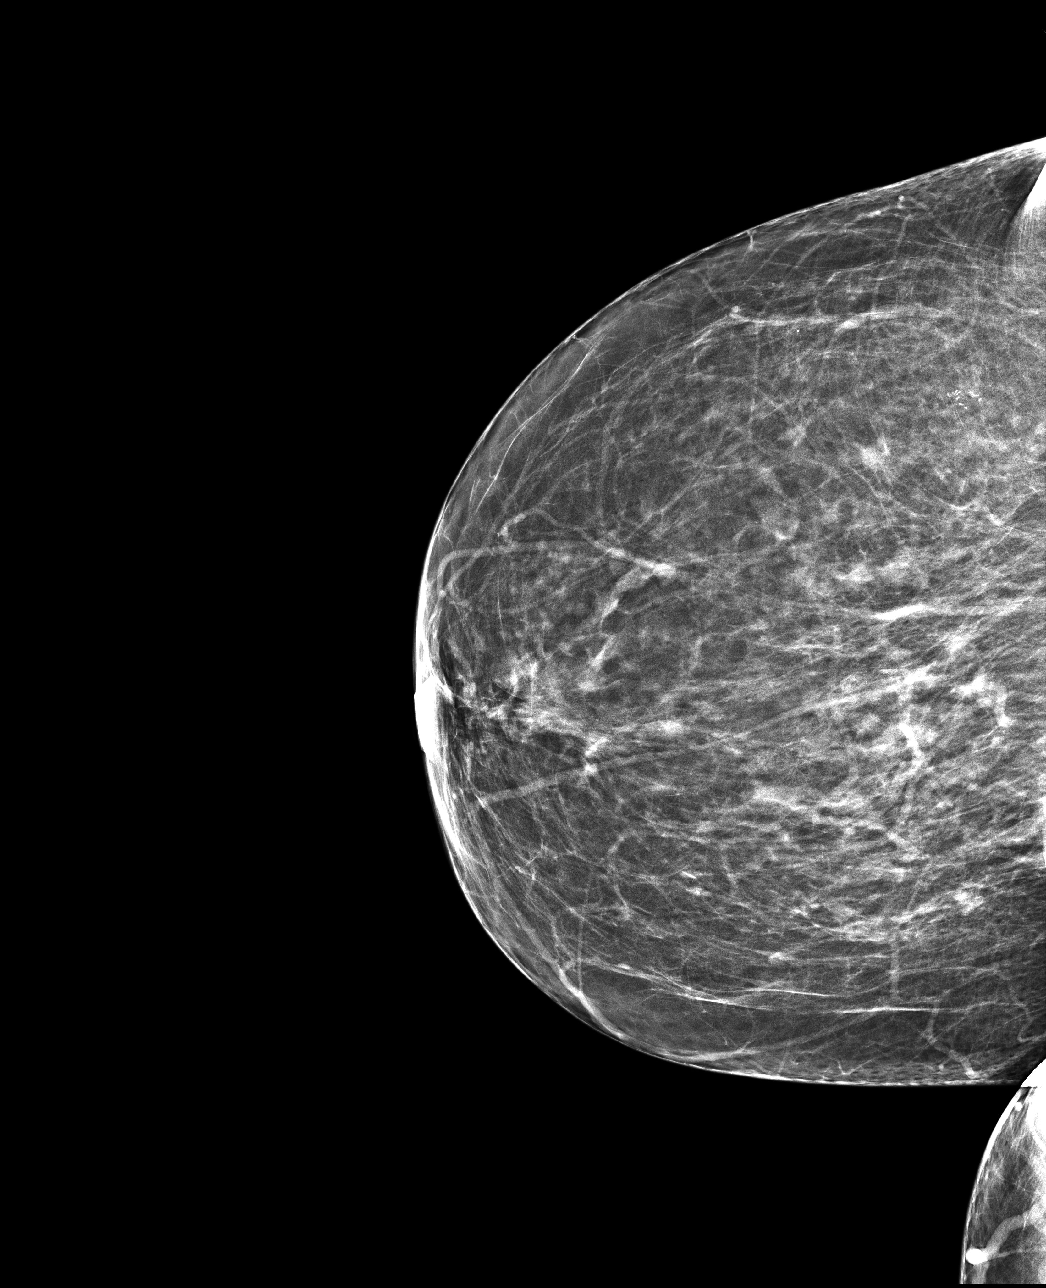

[4 of 4 positions shown; findings below may reference images not displayed]

ACR Breast Density Category b: There are scattered areas of
fibroglandular density.
FINDINGS: In the right breast, calcifications warrant further evaluation with
magnified views. In the left breast, calcifications warrant further
evaluation with magnified views.
IMPRESSION: Further evaluation is suggested for calcifications in the right and
left breasts.

RECOMMENDATION:
Diagnostic mammogram of the right and left breasts.  (Code:[id number])

The patient will be contacted regarding the findings, and additional
imaging will be scheduled.

BI-RADS CATEGORY  0: Incomplete. Need additional imaging evaluation
and/or prior mammograms for comparison.

## 2022-03-23 ENCOUNTER — Other Ambulatory Visit (HOSPITAL_COMMUNITY): Payer: Self-pay

## 2022-03-30 NOTE — Telephone Encounter (Signed)
Submitted application for BREZTRI to [employer] for patient assistance.   Phone: [phone]  

## 2022-04-05 ENCOUNTER — Other Ambulatory Visit (HOSPITAL_COMMUNITY): Payer: Self-pay

## 2022-04-15 NOTE — Telephone Encounter (Signed)
Received notification from [employer] regarding approval for [place]. Patient assistance approved from 04/08/22 to 02/21/23.  MEDICATION WILL SHIP TO OFFICE  Phone: [phone]

## 2022-04-19 ENCOUNTER — Telehealth: Payer: Self-pay | Admitting: Pharmacist

## 2022-04-19 NOTE — Telephone Encounter (Signed)
Can you please let patient know her [employer] Aas insulin u-200 is here for pick up  Pick up by Friday  #[phone]  Thank you!

## 2022-04-21 NOTE — Telephone Encounter (Signed)
Informed patient up pickup status. She will be coming by to get it.

## 2022-05-02 ENCOUNTER — Other Ambulatory Visit: Payer: Self-pay | Admitting: Family Medicine

## 2022-05-05 ENCOUNTER — Other Ambulatory Visit: Payer: Self-pay | Admitting: Neurosurgery

## 2022-05-05 DIAGNOSIS — I671 Cerebral aneurysm, nonruptured: Secondary | ICD-10-CM

## 2022-05-13 ENCOUNTER — Ambulatory Visit (HOSPITAL_COMMUNITY)
Admission: RE | Admit: 2022-05-13 | Discharge: 2022-05-13 | Disposition: A | Discharge status: Home / Self Care | Payer: Medicare HMO | Source: Ambulatory Visit | Attending: Neurosurgery | Admitting: Neurosurgery

## 2022-05-13 ENCOUNTER — Other Ambulatory Visit: Payer: Self-pay | Admitting: Neurosurgery

## 2022-05-13 ENCOUNTER — Ambulatory Visit: Payer: Medicare HMO | Admitting: Family Medicine

## 2022-05-13 ENCOUNTER — Other Ambulatory Visit: Payer: Self-pay

## 2022-05-13 DIAGNOSIS — H8109 Meniere's disease, unspecified ear: Secondary | ICD-10-CM | POA: Diagnosis not present

## 2022-05-13 DIAGNOSIS — I671 Cerebral aneurysm, nonruptured: Secondary | ICD-10-CM | POA: Diagnosis not present

## 2022-05-13 DIAGNOSIS — Z48812 Encounter for surgical aftercare following surgery on the circulatory system: Secondary | ICD-10-CM | POA: Diagnosis not present

## 2022-05-13 HISTORY — PX: IR ANGIO INTRA EXTRACRAN SEL COM CAROTID INNOMINATE UNI R MOD SED: IMG5359

## 2022-05-13 LAB — CBC WITH DIFFERENTIAL/PLATELET
Abs Immature Granulocytes: 0.02 10*3/uL (ref 0.00–0.07)
Basophils Absolute: 0.1 10*3/uL (ref 0.0–0.1)
Basophils Relative: 1 %
Eosinophils Absolute: 0.2 10*3/uL (ref 0.0–0.5)
Eosinophils Relative: 4 %
HCT: 34.3 % — ABNORMAL LOW (ref 36.0–46.0)
Hemoglobin: 10.6 g/dL — ABNORMAL LOW (ref 12.0–15.0)
Immature Granulocytes: 0 %
Lymphocytes Relative: 22 %
Lymphs Abs: 1.3 10*3/uL (ref 0.7–4.0)
MCH: 31.9 pg (ref 26.0–34.0)
MCHC: 30.9 g/dL (ref 30.0–36.0)
MCV: 103.3 fL — ABNORMAL HIGH (ref 80.0–100.0)
Monocytes Absolute: 0.6 10*3/uL (ref 0.1–1.0)
Monocytes Relative: 10 %
Neutro Abs: 3.7 10*3/uL (ref 1.7–7.7)
Neutrophils Relative %: 63 %
Platelets: 164 10*3/uL (ref 150–400)
RBC: 3.32 MIL/uL — ABNORMAL LOW (ref 3.87–5.11)
RDW: 13.3 % (ref 11.5–15.5)
WBC: 5.9 10*3/uL (ref 4.0–10.5)
nRBC: 0 % (ref 0.0–0.2)

## 2022-05-13 LAB — BASIC METABOLIC PANEL
Anion gap: 8 (ref 5–15)
BUN: 32 mg/dL — ABNORMAL HIGH (ref 8–23)
CO2: 21 mmol/L — ABNORMAL LOW (ref 22–32)
Calcium: 8.4 mg/dL — ABNORMAL LOW (ref 8.9–10.3)
Chloride: 108 mmol/L (ref 98–111)
Creatinine, Ser: 1.43 mg/dL — ABNORMAL HIGH (ref 0.44–1.00)
GFR, Estimated: 39 mL/min — ABNORMAL LOW (ref >60)
Glucose, Bld: 69 mg/dL — ABNORMAL LOW (ref 70–99)
Potassium: 4.2 mmol/L (ref 3.5–5.1)
Sodium: 137 mmol/L (ref 135–145)

## 2022-05-13 LAB — APTT: aPTT: 29 seconds (ref 24–36)

## 2022-05-13 LAB — PROTIME-INR
INR: 1.1 (ref 0.8–1.2)
Prothrombin Time: 14.2 seconds (ref 11.4–15.2)

## 2022-05-13 MED ORDER — MIDAZOLAM HCL 2 MG/2ML IJ SOLN
INTRAMUSCULAR | Status: AC | PRN
  Administered 2022-05-13: 1 mg via INTRAVENOUS

## 2022-05-13 MED ORDER — FENTANYL CITRATE (PF) 100 MCG/2ML IJ SOLN
INTRAMUSCULAR | Status: AC
  Filled 2022-05-13: qty 2

## 2022-05-13 MED ORDER — IOHEXOL 300 MG/ML  SOLN
50.0000 mL | Freq: Once | INTRAMUSCULAR | Status: AC | PRN
  Administered 2022-05-13: 20 mL via INTRA_ARTERIAL

## 2022-05-13 MED ORDER — HEPARIN SODIUM (PORCINE) 1000 UNIT/ML IJ SOLN
INTRAMUSCULAR | Status: AC | PRN
  Administered 2022-05-13: 2000 [IU] via INTRAVENOUS

## 2022-05-13 MED ORDER — HYDROCODONE-ACETAMINOPHEN 5-325 MG PO TABS
1.0000 | ORAL_TABLET | ORAL | Status: DC | PRN
  Administered 2022-05-13: 1 via ORAL
  Filled 2022-05-13: qty 1

## 2022-05-13 MED ORDER — LABETALOL HCL 5 MG/ML IV SOLN
INTRAVENOUS | Status: AC
  Filled 2022-05-13: qty 4

## 2022-05-13 MED ORDER — LIDOCAINE HCL 1 % IJ SOLN
INTRAMUSCULAR | Status: AC
  Filled 2022-05-13: qty 20

## 2022-05-13 MED ORDER — SODIUM CHLORIDE 0.9 % IV SOLN: INTRAVENOUS | Status: DC

## 2022-05-13 MED ORDER — CHLORHEXIDINE GLUCONATE CLOTH 2 % EX PADS: 6.0000 | MEDICATED_PAD | Freq: Once | CUTANEOUS | Status: DC

## 2022-05-13 MED ORDER — IOHEXOL 300 MG/ML  SOLN: 100.0000 mL | Freq: Once | INTRAMUSCULAR | Status: DC | PRN

## 2022-05-13 MED ORDER — MIDAZOLAM HCL 2 MG/2ML IJ SOLN
INTRAMUSCULAR | Status: AC
  Filled 2022-05-13: qty 2

## 2022-05-13 MED ORDER — LABETALOL HCL 5 MG/ML IV SOLN
INTRAVENOUS | Status: AC | PRN
  Administered 2022-05-13: 10 mg via INTRAVENOUS

## 2022-05-13 MED ORDER — VERAPAMIL HCL 2.5 MG/ML IV SOLN
INTRAVENOUS | Status: AC
  Filled 2022-05-13: qty 2

## 2022-05-13 MED ORDER — FENTANYL CITRATE (PF) 100 MCG/2ML IJ SOLN
INTRAMUSCULAR | Status: AC | PRN
  Administered 2022-05-13: 25 ug via INTRAVENOUS

## 2022-05-13 MED ORDER — HEPARIN SODIUM (PORCINE) 1000 UNIT/ML IJ SOLN
INTRAMUSCULAR | Status: AC
  Filled 2022-05-13: qty 10

## 2022-05-13 MED ORDER — LIDOCAINE HCL 1 % IJ SOLN
INTRAMUSCULAR | Status: AC
  Administered 2022-05-13: 10 mL via INTRADERMAL
  Filled 2022-05-13: qty 20

## 2022-05-13 MED ORDER — VANCOMYCIN HCL IN DEXTROSE 1-5 GM/200ML-% IV SOLN: 1000.0000 mg | INTRAVENOUS | Status: DC

## 2022-05-13 NOTE — Op Note (Signed)
  NEUROSURGERY BRIEF OPERATIVE  NOTE   PREOP DX: Aneurysm  POSTOP DX: Same  PROCEDURE: Diagnostic cerebral angiogram  SURGEON: Dr. Consuella Lose, MD  ANESTHESIA: IV Sedation with Local  APPROACH: Right trans-femoral  EBL: Minimal  SPECIMENS: None  COMPLICATIONS: None  CONDITION: Stable to recovery  FINDINGS (Full report in [facility]): 1. 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 Minimal neck residual, no in-stent stenosis.   Consuella Lose, MD [facility]

## 2022-05-13 NOTE — Sedation Documentation (Signed)
Mark, CN called short stay to inquire about lab still not being processed at this time and was informed that they were sent to the lab previously.  Same checked tube station and stated that they were not there.  Mark, CN called lab and inquired about blood specimens being received and was informed that they had not received any specimens for patient.  It was requested that they check the tube station in lab and run specimens if they were in fact received.

## 2022-05-13 NOTE — Sedation Documentation (Signed)
New blood specimens drawn from IV line, labeled with date time and initials and walked to lab for processing by this RN.  During same time, lab called back to state that they had received blood specimens from short stay and would run them.  New specimens received by lab to ensure previously collected specimens had not or would not hemolyze during processing.  MD made aware of same and acknowledged with no concerns voiced at this time.

## 2022-05-13 NOTE — H&P (Signed)
Chief Complaint   Brain Aneurysm  History of Present Illness  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 She has done well from a neurologic standpoint. Her primary complaint is vertigo and hearing loss, and has recently been diagnosed with Meniere's disease. She does note that the vertigo appears to be somewhat better than it was previously.   Past Medical History   Past Medical History:  Diagnosis Date   Allergy    Anxiety    Arthritis    Asthma    Coronary artery disease    Depression    Diabetes mellitus without complication (HCC)    Dyspnea    GERD (gastroesophageal reflux disease)    Hyperlipidemia    Hypertension    Pneumonia     Past Surgical History   Past Surgical History:  Procedure Laterality Date   ABDOMINAL HYSTERECTOMY     BREAST EXCISIONAL BIOPSY     CESAREAN SECTION     3   CHOLECYSTECTOMY  2018   CORONARY STENT PLACEMENT  10/2017   [facility] hospital)   IR 3D INDEPENDENT WKST  11/26/2021   IR ANGIO INTRA EXTRACRAN SEL COM CAROTID INNOMINATE UNI R MOD SED  10/21/2021   IR ANGIO INTRA EXTRACRAN SEL INTERNAL CAROTID UNI L MOD SED  10/21/2021   IR ANGIO INTRA EXTRACRAN SEL INTERNAL CAROTID UNI R MOD SED  11/26/2021   IR ANGIO VERTEBRAL SEL VERTEBRAL UNI L MOD SED  10/21/2021   IR ANGIOGRAM FOLLOW UP STUDY  11/26/2021   IR ANGIOGRAM FOLLOW UP STUDY  11/26/2021   IR ANGIOGRAM FOLLOW UP STUDY  11/26/2021   IR TRANSCATH/EMBOLIZ  11/26/2021   LEFT HEART CATH AND CORONARY ANGIOGRAPHY N/A 02/25/2019   Procedure: LEFT HEART CATH AND CORONARY ANGIOGRAPHY;  Surgeon: Nelva Bush, MD;  Location: [place] [facility];  Service: Cardiovascular;  Laterality: N/A;   RADIOLOGY WITH ANESTHESIA N/A 11/26/2021   Procedure: 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;  Surgeon: Consuella Lose, MD;  Location: [place];   Service: Radiology;  Laterality: N/A;   REPLACEMENT TOTAL KNEE BILATERAL Bilateral    done in [place] (2003/2005)   RIGHT/LEFT HEART CATH AND CORONARY ANGIOGRAPHY N/A 09/22/2020   Procedure: RIGHT/LEFT HEART CATH AND CORONARY ANGIOGRAPHY;  Surgeon: Leonie Man, MD;  Location: [place] [facility];  Service: Cardiovascular;  Laterality: N/A;   TUBAL LIGATION      Social History   Social History   Tobacco Use   Smoking status: Never   Smokeless tobacco: Never  Vaping Use   Vaping Use: Never used  Substance Use Topics   Alcohol use: Never   Drug use: Never    Medications   Prior to Admission medications   Medication Sig Start Date End Date Taking? Authorizing Provider  albuterol (VENTOLIN HFA) 108 (90 Base) MCG/ACT inhaler TAKE 2 PUFFS BY MOUTH EVERY 4 HOURS AS NEEDED FOR WHEEZE 09/16/21   Ronnie Doss M, DO  Alcohol Swabs (B-D SINGLE USE SWABS REGULAR) PADS Apply topically. 07/29/15   [provider]  amLODipine (NORVASC) 10 MG tablet TAKE 1 TABLET BY MOUTH EVERY DAY 03/04/22   Arnoldo Lenis, MD  aspirin EC 81 MG tablet Take 81 mg by mouth in the morning.    [provider]  atorvastatin (LIPITOR) 40 MG tablet Take 1 tablet (40 mg total) by mouth at bedtime. 02/11/22  Ronnie Doss M, DO  Blood Glucose Calibration (ACCU-CHEK AVIVA) SOLN 2 Bottles by Other route as needed. 07/29/15   [provider]  Budeson-Glycopyrrol-Formoterol (BREZTRI AEROSPHERE) 160-9-4.8 MCG/ACT AERO Inhale 2 puffs into the lungs 2 (two) times daily. 09/22/21   Ronnie Doss M, DO  budesonide (PULMICORT) 0.25 MG/2ML nebulizer solution Take 2 mLs (0.25 mg total) by nebulization 2 (two) times daily. Patient taking differently: Take 0.25 mg by nebulization 2 (two) times daily as needed (shortness of breath). 08/05/21   Chesley Mires, MD  Calcium Carb-Cholecalciferol (CALCIUM 600+D3 PO) Take 1 tablet by mouth in the morning.    [provider]  cetirizine (ZYRTEC  ALLERGY) 10 MG tablet Take 1 tablet (10 mg total) by mouth daily. 06/24/21   Sharion Balloon, FNP  diazepam (VALIUM) 2 MG tablet Take 1 tablet (2 mg total) by mouth daily as needed (vertigo that is not relieved by meclizine). 01/07/22   Janora Norlander, DO  diphenhydrAMINE (BENADRYL) 25 MG tablet Take 50 mg by mouth daily as needed for allergies.    [provider]  fluticasone (FLONASE) 50 MCG/ACT nasal spray SPRAY 2 SPRAYS INTO EACH NOSTRIL EVERY DAY 02/18/22   Ronnie Doss M, DO  furosemide (LASIX) 20 MG tablet Take 1 tablet (20 mg total) by mouth as needed (based on weight - may take one extra tab for weight gain of 3lb in 24 hrs or 5lb in 1 week). Patient taking differently: Take 20 mg by mouth daily as needed (based on weight - may take one extra tab for weight gain of 3lb in 24 hrs or 5lb in 1 week). 10/06/20   Verta Ellen., NP  glucose blood test strip TEST BLOOD SUGAR TWICE DAILY 10/03/18   [provider]  ibuprofen (ADVIL) 200 MG tablet Take 400-600 mg by mouth daily as needed (pain (leg pain)).    [provider]  insulin degludec (TRESIBA FLEXTOUCH) 200 UNIT/ML FlexTouch Pen Inject 40 Units into the skin daily. 04/24/20   Janora Norlander, DO  Insulin Syringe-Needle U-100 31G X 5/16" 0.5 ML MISC Use to inject Insulin four times daily: Use as directed; Dx: E11.9, E66.9 10/03/18   [provider]  ipratropium-albuterol (DUONEB) 0.5-2.5 (3) MG/3ML SOLN TAKE 3 ML (1 VIAL) BY NEBULIZATION EVERY 6 HOURS AS NEEDED FOR WHEEZING. 02/16/21   Ronnie Doss M, DO  isosorbide mononitrate (IMDUR) 30 MG 24 hr tablet Take 15 mg by mouth 2 (two) times daily.    [provider]  lansoprazole (PREVACID) 15 MG capsule Take 30 mg by mouth at bedtime.    [provider]  lisinopril (ZESTRIL) 20 MG tablet TAKE 1 TABLET BY MOUTH EVERY DAY 12/16/21   Arnoldo Lenis, MD  magnesium oxide (MAG-OX) 400 MG tablet Take magnesium on days you take  lasix Patient taking differently: Take 400 mg by mouth daily. 09/30/20   Janora Norlander, DO  meclizine (ANTIVERT) 25 MG tablet Take 1 tablet (25 mg total) by mouth 3 (three) times daily as needed for up to 30 doses for dizziness. 12/29/21   Wyvonnia Dusky, MD  metoprolol tartrate (LOPRESSOR) 25 MG tablet TAKE 1 TABLET BY MOUTH TWICE A DAY 02/17/22   Ronnie Doss M, DO  mirtazapine (REMERON) 7.5 MG tablet Take 1 tablet (7.5 mg total) by mouth at bedtime. 02/11/22   Ronnie Doss M, DO  montelukast (SINGULAIR) 10 MG tablet TAKE 1 TABLET BY MOUTH EVERYDAY AT BEDTIME 01/17/22   Janora Norlander,  DO  nitroGLYCERIN (NITROSTAT) 0.4 MG SL tablet Place 1 tablet (0.4 mg total) under the tongue every 5 (five) minutes x 3 doses as needed for chest pain (if no relief after 2nd dose, proceed to ED or call 911). 09/15/21   Arnoldo Lenis, MD  pioglitazone (ACTOS) 15 MG tablet Take 1 tablet (15 mg total) by mouth daily. Note dose change 02/11/22   Ronnie Doss M, DO  potassium chloride (KLOR-CON M10) 10 MEQ tablet TAKE 1 TABLET (10 MEQ TOTAL) BY MOUTH EVERY EVENING. 05/02/22   Ronnie Doss M, DO  ticagrelor (BRILINTA) 90 MG TABS tablet Take 90 mg by mouth 2 (two) times daily.    [provider]  triamterene-hydrochlorothiazide (DYAZIDE) 37.5-25 MG capsule Take 1 capsule by mouth daily.    [provider]    Allergies   Allergies  Allergen Reactions   Aspartame Rash   Penicillins Itching and Rash    Did it involve swelling of the face/tongue/throat, SOB, or low BP? No Did it involve sudden or severe rash/hives, skin peeling, or any reaction on the inside of your mouth or nose? No Did you need to seek medical attention at a hospital or doctor's office? No When did it last happen?~25 years ago       If all above answers are "NO", may proceed with cephalosporin use.    Wilder Glade [Dapagliflozin]     UTIs   Metformin And Related     GI distress, dizziness   No  [facility] Food Allergies Other (See Comments)    Honey-Rash Artificial Sweeteners-rash Dextrose "measles like symptoms" Beet sugar "measles like symptoms"   Paxil [Paroxetine Hcl] Other (See Comments)    Twitching/feels like skin crawling   Prozac [Fluoxetine Hcl] Other (See Comments)    Twitching/feels like skin crawling   Zoloft [Sertraline Hcl] Other (See Comments)    Twitching/feels like skin crawling   Prednisone Palpitations   Ranexa [Ranolazine] Palpitations    Heart racing    Review of Systems  ROS  Neurologic Exam  Awake, alert, oriented Memory and concentration grossly intact Speech fluent, appropriate CN grossly intact Motor exam: Upper Extremities Deltoid Bicep Tricep Grip  Right 5/5 5/5 5/5 5/5  Left 5/5 5/5 5/5 5/5   Lower Extremities IP Quad PF DF EHL  Right 5/5 5/5 5/5 5/5 5/5  Left 5/5 5/5 5/5 5/5 5/5   Sensation grossly intact to LT  Impression  - 72 y.o. female 3mo s/p elective stent-supported coil embolization of distal right ACA aneurysm, neurologically well  Plan  - Proceed with routine short-term angiogram follow-up  I have reviewed the indications for the procedure as well as the details of the procedure and the expected postoperative course and recovery at length with the patient in the office. We have also reviewed in detail the risks, benefits, and alternatives to the procedure. All questions were answered and Lucella Depaepe provided informed consent to proceed.  Consuella Lose, MD [facility]

## 2022-05-19 ENCOUNTER — Other Ambulatory Visit: Payer: Self-pay | Admitting: Family Medicine

## 2022-05-19 DIAGNOSIS — E1159 Type 2 diabetes mellitus with other circulatory complications: Secondary | ICD-10-CM

## 2022-05-19 DIAGNOSIS — I25119 Atherosclerotic heart disease of native coronary artery with unspecified angina pectoris: Secondary | ICD-10-CM

## 2022-05-20 ENCOUNTER — Encounter: Payer: Self-pay | Admitting: Cardiology

## 2022-05-20 ENCOUNTER — Ambulatory Visit: Payer: Medicare HMO | Attending: Cardiology | Admitting: Cardiology

## 2022-05-20 VITALS — BP 144/72 | HR 52 | Ht 63.0 in | Wt 231.0 lb

## 2022-05-20 DIAGNOSIS — R42 Dizziness and giddiness: Secondary | ICD-10-CM

## 2022-05-20 DIAGNOSIS — I5032 Chronic diastolic (congestive) heart failure: Secondary | ICD-10-CM | POA: Diagnosis not present

## 2022-05-20 DIAGNOSIS — I10 Essential (primary) hypertension: Secondary | ICD-10-CM

## 2022-05-20 DIAGNOSIS — I25119 Atherosclerotic heart disease of native coronary artery with unspecified angina pectoris: Secondary | ICD-10-CM | POA: Diagnosis not present

## 2022-05-20 MED ORDER — RANOLAZINE ER 500 MG PO TB12: 500.0000 mg | ORAL_TABLET | Freq: Two times a day (BID) | ORAL | 3 refills | Status: DC

## 2022-05-20 NOTE — Patient Instructions (Signed)
Medication Instructions:  Your physician has recommended you make the following change in your medication:  -Start Ranexa 500 mg tablets twice daily  *If you need a refill on your cardiac medications before your next appointment, please call your pharmacy*   Lab Work: None If you have labs (blood work) drawn today and your tests are completely normal, you will receive your results only by: [place] (if you have MyChart) OR A paper copy in the mail If you have any lab test that is abnormal or we need to change your treatment, we will call you to review the results.   Testing/Procedures: None   Follow-Up: At [facility], you and your health needs are our priority.  As part of our continuing mission to provide you with exceptional heart care, we have created designated Provider Care Teams.  These Care Teams include your primary Cardiologist (physician) and Advanced Practice Providers (APPs -  Physician Assistants and Nurse Practitioners) who all work together to provide you with the care you need, when you need it.  We recommend signing up for the patient portal called "MyChart".  Sign up information is provided on this After Visit Summary.  MyChart is used to connect with patients for Virtual Visits (Telemedicine).  Patients are able to view lab/test results, encounter notes, upcoming appointments, etc.  Non-urgent messages can be sent to your provider as well.   To learn more about what you can do with MyChart, go to [email or web address].    Your next appointment:   6 month(s)  Provider:   Carlyle Dolly, MD    Other Instructions

## 2022-05-20 NOTE — Progress Notes (Signed)
Clinical Summary Amber Reeves is a 72 y.o.female seen today for follow up of the following medical problems.    1. CAD -11/2017 cath at [facility] showed >70% mid LAD, otherwise patent vessels. She presented with chest pain, not an MI - received DES to LAD - 11/2017 echo  LVEF Complete echocardiogram shows a left ventricular EF by visual approximation 60 to 65%. Normal left ventricular systolic function. Grade 1 left ventricular diastolic function. No significant valvular disease. No pericardial effusion   01/2018 stress test: THR not achieved, exercise limiting angina , [facility] treadmill score neg 1   - she reports prior false negative stress tests in the past.  - 09/04/18 nuclear stress by prior cardiollogist showed anterior breast artifact, probably normal,     Jan 2021 cath: mild nonobstructive disease.    -06/2020 echo LVEF 65-70%, grade II dd, - lasix changed to 20mg  daily   09/2020 RHC/LHC: LM patent, patent LAD stent, ramus patent, LCX patent, RCA 20%. Several tiny diagonals are jailed by stent, to small for intervetion Mean PA 21 PCWP 16 CI 4.08 LVEDP 18    - recent chest pains. Left sided pain. Sharp/stabbing, 10/10 in severity. Often awakes her from sleep. Not positional. Lasts 10-15 minutes. Can improve with NG. -sedentary lifestyle due to chronic dizziness, SOB      2. HTN - renal artery angio at time of 11/2017 cath showed no significant renal artery disease  - 120s-140s/50-55   3. Hyperlipidemia - compliant with emds   4. OSA on cpap - she attributes getting pneumonia when on cpap, she stopped wearing. - does not want to wear cpap       5. Chronic diastolic HF -some LE edema at times.  - compliant with meds   6. Dizziness - ER visit 07/2021  - from EMS notes bps 80s to 90s with standing.  - improved with IVFs   - dizziness can happen with laying flat, sitting or standing.  - recent inner ear issues, followed by ENT - ongoing times of room spinning  extended periods when lying flat on her back   8.Cerebral aneurysm - followed by neurosurg - 11/2021 stent/ciling procedure right ACA aneursym     9. Arrhythmia - 08/2021 monitor one 4 beat run NSVT, 31 runs SVT longest 15 seconds.  -had been on prednisone around this time.  Past Medical History:  Diagnosis Date   Allergy    Anxiety    Arthritis    Asthma    Coronary artery disease    Depression    Diabetes mellitus without complication (HCC)    Dyspnea    GERD (gastroesophageal reflux disease)    Hyperlipidemia    Hypertension    Pneumonia      Allergies  Allergen Reactions   Aspartame Rash   Penicillins Itching and Rash    Did it involve swelling of the face/tongue/throat, SOB, or low BP? No Did it involve sudden or severe rash/hives, skin peeling, or any reaction on the inside of your mouth or nose? No Did you need to seek medical attention at a hospital or doctor's office? No When did it last happen?~25 years ago       If all above answers are "NO", may proceed with cephalosporin use.    Wilder Glade [Dapagliflozin]     UTIs   Metformin And Related     GI distress, dizziness   No [facility] Food Allergies Other (See Comments)    Honey-Rash Artificial  Sweeteners-rash Dextrose "measles like symptoms" Beet sugar "measles like symptoms"   Paxil [Paroxetine Hcl] Other (See Comments)    Twitching/feels like skin crawling   Prozac [Fluoxetine Hcl] Other (See Comments)    Twitching/feels like skin crawling   Zoloft [Sertraline Hcl] Other (See Comments)    Twitching/feels like skin crawling   Prednisone Palpitations   Ranexa [Ranolazine] Palpitations    Heart racing     Current Outpatient Medications  Medication Sig Dispense Refill   albuterol (VENTOLIN HFA) 108 (90 Base) MCG/ACT inhaler TAKE 2 PUFFS BY MOUTH EVERY 4 HOURS AS NEEDED FOR WHEEZE 18 g 3   Alcohol Swabs (B-D SINGLE USE SWABS REGULAR) PADS Apply topically.     amLODipine (NORVASC) 10 MG tablet TAKE 1  TABLET BY MOUTH EVERY DAY 90 tablet 3   aspirin EC 81 MG tablet Take 81 mg by mouth in the morning.     atorvastatin (LIPITOR) 40 MG tablet Take 1 tablet (40 mg total) by mouth at bedtime. 90 tablet 3   Blood Glucose Calibration (ACCU-CHEK AVIVA) SOLN 2 Bottles by Other route as needed.     Budeson-Glycopyrrol-Formoterol (BREZTRI AEROSPHERE) 160-9-4.8 MCG/ACT AERO Inhale 2 puffs into the lungs 2 (two) times daily. 32.1 g 11   budesonide (PULMICORT) 0.25 MG/2ML nebulizer solution Take 2 mLs (0.25 mg total) by nebulization 2 (two) times daily. (Patient taking differently: Take 0.25 mg by nebulization 2 (two) times daily as needed (shortness of breath).) 120 mL 5   Calcium Carb-Cholecalciferol (CALCIUM 600+D3 PO) Take 1 tablet by mouth in the morning.     cetirizine (ZYRTEC ALLERGY) 10 MG tablet Take 1 tablet (10 mg total) by mouth daily. 90 tablet 1   diazepam (VALIUM) 2 MG tablet Take 1 tablet (2 mg total) by mouth daily as needed (vertigo that is not relieved by meclizine). 30 tablet 0   diphenhydrAMINE (BENADRYL) 25 MG tablet Take 50 mg by mouth daily as needed for allergies.     fluticasone (FLONASE) 50 MCG/ACT nasal spray SPRAY 2 SPRAYS INTO EACH NOSTRIL EVERY DAY 48 mL 2   furosemide (LASIX) 20 MG tablet Take 1 tablet (20 mg total) by mouth as needed (based on weight - may take one extra tab for weight gain of 3lb in 24 hrs or 5lb in 1 week). (Patient taking differently: Take 20 mg by mouth daily as needed (based on weight - may take one extra tab for weight gain of 3lb in 24 hrs or 5lb in 1 week).)     glucose blood test strip TEST BLOOD SUGAR TWICE DAILY     ibuprofen (ADVIL) 200 MG tablet Take 400-600 mg by mouth daily as needed (pain (leg pain)).     insulin degludec (TRESIBA FLEXTOUCH) 200 UNIT/ML FlexTouch Pen Inject 40 Units into the skin daily. 18 mL 0   Insulin Syringe-Needle U-100 31G X 5/16" 0.5 ML MISC Use to inject Insulin four times daily: Use as directed; Dx: E11.9, E66.9      ipratropium-albuterol (DUONEB) 0.5-2.5 (3) MG/3ML SOLN TAKE 3 ML (1 VIAL) BY NEBULIZATION EVERY 6 HOURS AS NEEDED FOR WHEEZING. 360 mL 0   isosorbide mononitrate (IMDUR) 30 MG 24 hr tablet Take 15 mg by mouth 2 (two) times daily.     lansoprazole (PREVACID) 15 MG capsule Take 30 mg by mouth at bedtime.     lisinopril (ZESTRIL) 20 MG tablet TAKE 1 TABLET BY MOUTH EVERY DAY 90 tablet 2   magnesium oxide (MAG-OX) 400 MG tablet Take magnesium on  days you take lasix (Patient taking differently: Take 400 mg by mouth daily.) 60 tablet 3   meclizine (ANTIVERT) 25 MG tablet Take 1 tablet (25 mg total) by mouth 3 (three) times daily as needed for up to 30 doses for dizziness. 30 tablet 1   metoprolol tartrate (LOPRESSOR) 25 MG tablet TAKE 1 TABLET BY MOUTH TWICE A DAY 180 tablet 0   mirtazapine (REMERON) 7.5 MG tablet Take 1 tablet (7.5 mg total) by mouth at bedtime. 90 tablet 3   montelukast (SINGULAIR) 10 MG tablet TAKE 1 TABLET BY MOUTH EVERYDAY AT BEDTIME 90 tablet 1   nitroGLYCERIN (NITROSTAT) 0.4 MG SL tablet Place 1 tablet (0.4 mg total) under the tongue every 5 (five) minutes x 3 doses as needed for chest pain (if no relief after 2nd dose, proceed to ED or call 911). 25 tablet 3   pioglitazone (ACTOS) 15 MG tablet Take 1 tablet (15 mg total) by mouth daily. Note dose change 90 tablet 3   potassium chloride (KLOR-CON M10) 10 MEQ tablet TAKE 1 TABLET (10 MEQ TOTAL) BY MOUTH EVERY EVENING. 90 tablet 0   ticagrelor (BRILINTA) 90 MG TABS tablet Take 90 mg by mouth 2 (two) times daily.     triamterene-hydrochlorothiazide (DYAZIDE) 37.5-25 MG capsule Take 1 capsule by mouth daily.     No current facility-administered medications for this visit.     Past Surgical History:  Procedure Laterality Date   ABDOMINAL HYSTERECTOMY     BREAST EXCISIONAL BIOPSY     CESAREAN SECTION     3   CHOLECYSTECTOMY  2018   CORONARY STENT PLACEMENT  10/2017   [facility] hospital)   IR 3D INDEPENDENT [place]   11/26/2021   IR ANGIO INTRA EXTRACRAN SEL COM CAROTID INNOMINATE UNI R MOD SED  10/21/2021   IR ANGIO INTRA EXTRACRAN SEL COM CAROTID INNOMINATE UNI R MOD SED  05/13/2022   IR ANGIO INTRA EXTRACRAN SEL INTERNAL CAROTID UNI L MOD SED  10/21/2021   IR ANGIO INTRA EXTRACRAN SEL INTERNAL CAROTID UNI R MOD SED  11/26/2021   IR ANGIO VERTEBRAL SEL VERTEBRAL UNI L MOD SED  10/21/2021   IR ANGIOGRAM FOLLOW UP STUDY  11/26/2021   IR ANGIOGRAM FOLLOW UP STUDY  11/26/2021   IR ANGIOGRAM FOLLOW UP STUDY  11/26/2021   IR TRANSCATH/EMBOLIZ  11/26/2021   LEFT HEART CATH AND CORONARY ANGIOGRAPHY N/A 02/25/2019   Procedure: LEFT HEART CATH AND CORONARY ANGIOGRAPHY;  Surgeon: Nelva Bush, MD;  Location: [place] [facility];  Service: Cardiovascular;  Laterality: N/A;   RADIOLOGY WITH ANESTHESIA N/A 11/26/2021   Procedure: 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;  Surgeon: Consuella Lose, MD;  Location: [place];  Service: Radiology;  Laterality: N/A;   REPLACEMENT TOTAL KNEE BILATERAL Bilateral    done in [place] (2003/2005)   RIGHT/LEFT HEART CATH AND CORONARY ANGIOGRAPHY N/A 09/22/2020   Procedure: RIGHT/LEFT HEART CATH AND CORONARY ANGIOGRAPHY;  Surgeon: Leonie Man, MD;  Location: [place] [facility];  Service: Cardiovascular;  Laterality: N/A;   TUBAL LIGATION       Allergies  Allergen Reactions   Aspartame Rash   Penicillins Itching and Rash    Did it involve swelling of the face/tongue/throat, SOB, or low BP? No Did it involve sudden or severe rash/hives, skin peeling, or any reaction on the inside of your mouth or nose? No Did you need to seek medical attention at a hospital or doctor's office? No When did it last  happen?~25 years ago       If all above answers are "NO", may proceed with cephalosporin use.    Wilder Glade [Dapagliflozin]     UTIs   Metformin And Related     GI distress, dizziness   No [facility] Food Allergies Other (See Comments)    Honey-Rash Artificial  Sweeteners-rash Dextrose "measles like symptoms" Beet sugar "measles like symptoms"   Paxil [Paroxetine Hcl] Other (See Comments)    Twitching/feels like skin crawling   Prozac [Fluoxetine Hcl] Other (See Comments)    Twitching/feels like skin crawling   Zoloft [Sertraline Hcl] Other (See Comments)    Twitching/feels like skin crawling   Prednisone Palpitations   Ranexa [Ranolazine] Palpitations    Heart racing      Family History  Problem Relation Age of Onset   Heart disease Mother    Stroke Mother    Hypertension Mother    Cancer Father    Stroke Father      Social History Ms. Rix reports that she has never smoked. She has never used smokeless tobacco. Ms. Rolf reports no history of alcohol use.   Review of Systems CONSTITUTIONAL: No weight loss, fever, chills, weakness or fatigue.  HEENT: Eyes: No visual loss, blurred vision, double vision or yellow sclerae.No hearing loss, sneezing, congestion, runny nose or sore throat.  SKIN: No rash or itching.  CARDIOVASCULAR: per hpi RESPIRATORY: No shortness of breath, cough or sputum.  GASTROINTESTINAL: No anorexia, nausea, vomiting or diarrhea. No abdominal pain or blood.  GENITOURINARY: No burning on urination, no polyuria NEUROLOGICAL: No headache, dizziness, syncope, paralysis, ataxia, numbness or tingling in the extremities. No change in bowel or bladder control.  MUSCULOSKELETAL: No muscle, back pain, joint pain or stiffness.  LYMPHATICS: No enlarged nodes. No history of splenectomy.  PSYCHIATRIC: No history of depression or anxiety.  ENDOCRINOLOGIC: No reports of sweating, cold or heat intolerance. No polyuria or polydipsia.  Marland Kitchen   Physical Examination Today's Vitals   05/20/22 1328  BP: (!) 144/72  Pulse: (!) 52  SpO2: 97%  Weight: 231 lb (104.8 kg)  Height: 5\' 3"  (1.6 m)   Body mass index is 40.92 kg/m.  Gen: resting comfortably, no acute distress HEENT: no scleral icterus, pupils equal round and  reactive, no palptable cervical adenopathy,  CV: RRR, no m/rg, no jvd Resp: Clear to auscultation bilaterally GI: abdomen is soft, non-tender, non-distended, normal bowel sounds, no hepatosplenomegaly MSK: extremities are warm, no edema.  Skin: warm, no rash Neuro:  no focal deficits Psych: appropriate affect   Diagnostic Studies  11/2017 cath [facility] Severe >70% long mid LAD stenosis No other significant angiographic stenosis in the other coronary arteries  DES to mid LAD   Malignant hypertension - selective renal angio without any evidence of  renal artery stenosis.   Renal angio procedure:  A Tiger 4 catheter was used to selectively engage both the left and right  renal arteries and was used to perform selective angiography.    Renal artery anatomy:  The left kidney is supplied by a single renal artery which has no  angiographic stenosis  The right kidney is supplied by a single rena artery which has no  angiographic stenosis     Jan 2021 Mild, non-obstructive coronary artery disease. Widely patent proximal/mid LAD stent. Normal left ventricular systolic function with upper normal to mildly elevated filling pressure (LVEDP ~15 mmHg).   06/2020 echo  1. Left ventricular ejection fraction, by estimation, is 65 to 70%. The  left ventricle has normal function. Left ventricular endocardial border  not optimally defined to evaluate regional wall motion. There is mild left  ventricular hypertrophy. Left  ventricular diastolic parameters are consistent with Grade II diastolic  dysfunction (pseudonormalization). Normal global longitudinal strain of  -19.8%.   2. Right ventricular systolic function is normal. The right ventricular  size is normal. There is normal pulmonary artery systolic pressure. The  estimated right ventricular systolic pressure is [id number] mmHg.   3. Left atrial size was mildly dilated.   4. Right atrial size was mildly dilated.   5. The mitral valve  is grossly normal. Trivial mitral valve  regurgitation.   6. The aortic valve has an indeterminant number of cusps. Aortic valve  regurgitation is not visualized.   7. The inferior vena cava is normal in size with greater than 50%  respiratory variability, suggesting right atrial pressure of 3 mmHg.     09/2020 RHC/LHC: LM patent, patent LAD stent, ramus patent, LCX patent, RCA 20% Mean PA 21 PCWP 16 CI 4.08     08/2021 heart monitor Patch Wear Time:  9 days and 17 hours (2023-07-10T13:32:00-0400 to 2023-07-20T06:44:39-0400)   Patient had a min HR of 46 bpm, max HR of 179 bpm, and avg HR of 58 bpm. Predominant underlying rhythm was Sinus Rhythm. 1 run of Ventricular Tachycardia occurred lasting 4 beats with a max rate of 169 bpm (avg 156 bpm). 31 Supraventricular Tachycardia  runs occurred, the run with the fastest interval lasting 11 beats with a max rate of 179 bpm, the longest lasting 15.1 secs with an avg rate of 107 bpm. Isolated SVEs were occasional (2.5%, 16848), SVE Couplets were rare (<1.0%, 1094), and SVE Triplets  were rare (<1.0%, 59). Isolated VEs were rare (<1.0%, 685), VE Couplets were rare (<1.0%, 1), and VE Triplets were rare (<1.0%, 1). Ventricular Bigeminy was present.   Assessment and Plan   1. CAD with other forms of angina.  - history of prior stent to LAD - has had chronic intermittent chest pains.  - Jan 2021 and Aug 2022 caths without significant disease.  - recent sharp chest pains awakening her from sleep, not typical for angina. She has some small jailed diags on cath, try adding ranexa and follow symptoms     2. Chronic diastolic HF - she is euvoelmic, continue current meds     3.HTN - compliant with meds  4. Dizziness/vertigo - ongoing symptoms - she reports room spinning when lying down flat, sitting or standing - her symptosm are not positional, not consistent with orthostatic symptoms or arrhythmia - asked to f/u back with her ENT        Arnoldo Lenis, M.D.

## 2022-05-24 ENCOUNTER — Ambulatory Visit: Payer: Medicare HMO | Admitting: Family Medicine

## 2022-05-27 ENCOUNTER — Encounter: Payer: Self-pay | Admitting: Family Medicine

## 2022-05-27 DIAGNOSIS — H9313 Tinnitus, bilateral: Secondary | ICD-10-CM

## 2022-05-27 DIAGNOSIS — R42 Dizziness and giddiness: Secondary | ICD-10-CM

## 2022-06-01 DIAGNOSIS — I671 Cerebral aneurysm, nonruptured: Secondary | ICD-10-CM | POA: Diagnosis not present

## 2022-06-01 DIAGNOSIS — Z6841 Body Mass Index (BMI) 40.0 and over, adult: Secondary | ICD-10-CM | POA: Diagnosis not present

## 2022-06-28 NOTE — Patient Instructions (Incomplete)
Our records indicate that you are due for your screening mammogram.  Please call the imaging center that does your yearly mammograms to make an appointment for a mammogram at your earliest convenience. Our office also has a mobile unit through the [facility] that comes to our location. Please call our office if you would like to make an appointment.   

## 2022-06-29 ENCOUNTER — Encounter: Payer: Self-pay | Admitting: Family Medicine

## 2022-06-29 ENCOUNTER — Ambulatory Visit (INDEPENDENT_AMBULATORY_CARE_PROVIDER_SITE_OTHER): Payer: Medicare HMO | Admitting: Family Medicine

## 2022-06-29 VITALS — BP 113/58 | HR 58 | Temp 98.6°F | Ht 63.0 in | Wt 235.0 lb

## 2022-06-29 DIAGNOSIS — I152 Hypertension secondary to endocrine disorders: Secondary | ICD-10-CM

## 2022-06-29 DIAGNOSIS — R42 Dizziness and giddiness: Secondary | ICD-10-CM

## 2022-06-29 DIAGNOSIS — E1159 Type 2 diabetes mellitus with other circulatory complications: Secondary | ICD-10-CM | POA: Diagnosis not present

## 2022-06-29 DIAGNOSIS — J4540 Moderate persistent asthma, uncomplicated: Secondary | ICD-10-CM

## 2022-06-29 DIAGNOSIS — I25119 Atherosclerotic heart disease of native coronary artery with unspecified angina pectoris: Secondary | ICD-10-CM | POA: Diagnosis not present

## 2022-06-29 DIAGNOSIS — F329 Major depressive disorder, single episode, unspecified: Secondary | ICD-10-CM | POA: Diagnosis not present

## 2022-06-29 DIAGNOSIS — E1169 Type 2 diabetes mellitus with other specified complication: Secondary | ICD-10-CM

## 2022-06-29 DIAGNOSIS — Z794 Long term (current) use of insulin: Secondary | ICD-10-CM

## 2022-06-29 DIAGNOSIS — F418 Other specified anxiety disorders: Secondary | ICD-10-CM | POA: Diagnosis not present

## 2022-06-29 DIAGNOSIS — E785 Hyperlipidemia, unspecified: Secondary | ICD-10-CM

## 2022-06-29 DIAGNOSIS — F319 Bipolar disorder, unspecified: Secondary | ICD-10-CM | POA: Diagnosis not present

## 2022-06-29 LAB — BAYER DCA HB A1C WAIVED: HB A1C (BAYER DCA - WAIVED): 5.7 % — ABNORMAL HIGH (ref 4.8–5.6)

## 2022-06-29 MED ORDER — TRIAMTERENE-HCTZ 37.5-25 MG PO CAPS: 1.0000 | ORAL_CAPSULE | Freq: Every day | ORAL | 3 refills | Status: DC

## 2022-06-29 MED ORDER — DIAZEPAM 2 MG PO TABS: 2.0000 mg | ORAL_TABLET | Freq: Every day | ORAL | 0 refills | Status: DC | PRN

## 2022-06-29 MED ORDER — NITROGLYCERIN 0.4 MG SL SUBL: 0.4000 mg | SUBLINGUAL_TABLET | SUBLINGUAL | 3 refills | Status: DC | PRN

## 2022-06-29 MED ORDER — ALBUTEROL SULFATE HFA 108 (90 BASE) MCG/ACT IN AERS: INHALATION_SPRAY | RESPIRATORY_TRACT | 3 refills | Status: DC

## 2022-06-29 NOTE — Progress Notes (Signed)
Subjective: CC:DM PCP: Raliegh Ip, DO [id number] Attalia Gronau is a 72 y.o. female presenting to clinic today for:  1. Type 2 Diabetes with hypertension, hyperlipidemia:  Patient is accompanied today's visit by her spouse.  She has been compliant with her Tresiba at 36 units daily.  Her blood sugars have been running somewhere between 75 and 110.  She notes that she discontinued her Lipitor, amlodipine, Imdur because she was tired of being dizzy and felt that she was on too many medications causing these interactions.  She notes that she was taken off of Brilinta a while ago.  She never did start the Ranexa .Marland Kitchen She of note does note that she has not really had resolution of her dizziness but it has gotten somewhat better.  She is taking Valium roughly 1, maybe 2 times per month in efforts to leave severe dizzy symptoms.  She still has several tablets left over of the bottle that was given to her back in November but does need a refill for going forward.  She does not have an ENT appointment until June for second opinion.  Last eye exam: needs Last foot exam: needs Last A1c:  Lab Results  Component Value Date   HGBA1C 6.1 (H) 11/17/2021   Nephropathy screen indicated?: needs Last flu, zoster and/or pneumovax:  Immunization History  Administered Date(s) Administered   Influenza, High Dose Seasonal PF 02/16/2017   Influenza,inj,Quad PF,6+ Mos 12/18/2013   Moderna Sars-Covid-2 Vaccination 05/01/2019, 05/29/2019, 02/18/2020    ROS: Per HPI  Allergies  Allergen Reactions   Aspartame Rash   Penicillins Itching and Rash    Did it involve swelling of the face/tongue/throat, SOB, or low BP? No Did it involve sudden or severe rash/hives, skin peeling, or any reaction on the inside of your mouth or nose? No Did you need to seek medical attention at a hospital or doctor's office? No When did it last happen?~25 years ago       If all above answers are "NO", may proceed with  cephalosporin use.    Farxiga [Dapagliflozin]     UTIs   Glucose    Metformin     Other Reaction(s): Dizziness  GI distress, dizziness   Metformin And Related     GI distress, dizziness   No [facility] Food Allergies Other (See Comments)    Honey-Rash Artificial Sweeteners-rash Dextrose "measles like symptoms" Beet sugar "measles like symptoms"   Paxil [Paroxetine Hcl] Other (See Comments)    Twitching/feels like skin crawling   Prozac [Fluoxetine Hcl] Other (See Comments)    Twitching/feels like skin crawling   Zoloft [Sertraline Hcl] Other (See Comments)    Twitching/feels like skin crawling   Prednisone Palpitations   Ranexa [Ranolazine] Palpitations    Heart racing   Past Medical History:  Diagnosis Date   Allergy    Anxiety    Arthritis    Asthma    Coronary artery disease    Depression    Diabetes mellitus without complication (HCC)    Dyspnea    GERD (gastroesophageal reflux disease)    Hyperlipidemia    Hypertension    Pneumonia     Current Outpatient Medications:    albuterol (VENTOLIN HFA) 108 (90 Base) MCG/ACT inhaler, TAKE 2 PUFFS BY MOUTH EVERY 4 HOURS AS NEEDED FOR WHEEZE, Disp: 18 g, Rfl: 3   Alcohol Swabs (B-D SINGLE USE SWABS REGULAR) PADS, Apply topically., Disp: , Rfl:    amLODipine (NORVASC) 10 MG tablet, TAKE 1 TABLET  BY MOUTH EVERY DAY (Patient taking differently: Take 10 mg by mouth daily. Taking half tablet, 0.5 mg), Disp: 90 tablet, Rfl: 3   aspirin EC 81 MG tablet, Take 81 mg by mouth in the morning., Disp: , Rfl:    atorvastatin (LIPITOR) 40 MG tablet, Take 1 tablet (40 mg total) by mouth at bedtime., Disp: 90 tablet, Rfl: 3   Blood Glucose Calibration (ACCU-CHEK AVIVA) SOLN, 2 Bottles by Other route as needed., Disp: , Rfl:    Budeson-Glycopyrrol-Formoterol (BREZTRI AEROSPHERE) 160-9-4.8 MCG/ACT AERO, Inhale 2 puffs into the lungs 2 (two) times daily., Disp: 32.1 g, Rfl: 11   budesonide (PULMICORT) 0.25 MG/2ML nebulizer solution, Take 2  mLs (0.25 mg total) by nebulization 2 (two) times daily. (Patient taking differently: Take 0.25 mg by nebulization 2 (two) times daily as needed (shortness of breath).), Disp: 120 mL, Rfl: 5   Calcium Carb-Cholecalciferol (CALCIUM 600+D3 PO), Take 1 tablet by mouth in the morning., Disp: , Rfl:    cetirizine (ZYRTEC ALLERGY) 10 MG tablet, Take 1 tablet (10 mg total) by mouth daily., Disp: 90 tablet, Rfl: 1   diazepam (VALIUM) 2 MG tablet, Take 1 tablet (2 mg total) by mouth daily as needed (vertigo that is not relieved by meclizine)., Disp: 30 tablet, Rfl: 0   diphenhydrAMINE (BENADRYL) 25 MG tablet, Take 50 mg by mouth daily as needed for allergies., Disp: , Rfl:    fluticasone (FLONASE) 50 MCG/ACT nasal spray, SPRAY 2 SPRAYS INTO EACH NOSTRIL EVERY DAY, Disp: 48 mL, Rfl: 2   furosemide (LASIX) 20 MG tablet, Take 1 tablet (20 mg total) by mouth as needed (based on weight - may take one extra tab for weight gain of 3lb in 24 hrs or 5lb in 1 week). (Patient taking differently: Take 20 mg by mouth daily as needed (based on weight - may take one extra tab for weight gain of 3lb in 24 hrs or 5lb in 1 week).), Disp: , Rfl:    glucose blood test strip, TEST BLOOD SUGAR TWICE DAILY, Disp: , Rfl:    ibuprofen (ADVIL) 200 MG tablet, Take 400-600 mg by mouth daily as needed (pain (leg pain))., Disp: , Rfl:    insulin degludec (TRESIBA FLEXTOUCH) 200 UNIT/ML FlexTouch Pen, Inject 40 Units into the skin daily., Disp: 18 mL, Rfl: 0   Insulin Syringe-Needle U-100 31G X 5/16" 0.5 ML MISC, Use to inject Insulin four times daily: Use as directed; Dx: E11.9, E66.9, Disp: , Rfl:    ipratropium-albuterol (DUONEB) 0.5-2.5 (3) MG/3ML SOLN, TAKE 3 ML (1 VIAL) BY NEBULIZATION EVERY 6 HOURS AS NEEDED FOR WHEEZING., Disp: 360 mL, Rfl: 0   isosorbide mononitrate (IMDUR) 30 MG 24 hr tablet, Take 15 mg by mouth 2 (two) times daily., Disp: , Rfl:    lansoprazole (PREVACID) 15 MG capsule, Take 30 mg by mouth at bedtime., Disp: , Rfl:     lisinopril (ZESTRIL) 20 MG tablet, TAKE 1 TABLET BY MOUTH EVERY DAY, Disp: 90 tablet, Rfl: 2   magnesium oxide (MAG-OX) 400 MG tablet, Take magnesium on days you take lasix (Patient taking differently: Take 400 mg by mouth daily. Taking daily), Disp: 60 tablet, Rfl: 3   meclizine (ANTIVERT) 25 MG tablet, Take 1 tablet (25 mg total) by mouth 3 (three) times daily as needed for up to 30 doses for dizziness., Disp: 30 tablet, Rfl: 1   metoprolol tartrate (LOPRESSOR) 25 MG tablet, TAKE 1 TABLET BY MOUTH TWICE A DAY, Disp: 180 tablet, Rfl: 0  mirtazapine (REMERON) 7.5 MG tablet, Take 1 tablet (7.5 mg total) by mouth at bedtime., Disp: 90 tablet, Rfl: 3   montelukast (SINGULAIR) 10 MG tablet, TAKE 1 TABLET BY MOUTH EVERYDAY AT BEDTIME, Disp: 90 tablet, Rfl: 1   nitroGLYCERIN (NITROSTAT) 0.4 MG SL tablet, Place 1 tablet (0.4 mg total) under the tongue every 5 (five) minutes x 3 doses as needed for chest pain (if no relief after 2nd dose, proceed to ED or call 911)., Disp: 25 tablet, Rfl: 3   pioglitazone (ACTOS) 15 MG tablet, Take 1 tablet (15 mg total) by mouth daily. Note dose change, Disp: 90 tablet, Rfl: 3   potassium chloride (KLOR-CON M10) 10 MEQ tablet, TAKE 1 TABLET (10 MEQ TOTAL) BY MOUTH EVERY EVENING., Disp: 90 tablet, Rfl: 0   ranolazine (RANEXA) 500 MG 12 hr tablet, Take 1 tablet (500 mg total) by mouth 2 (two) times daily., Disp: 180 tablet, Rfl: 3   ticagrelor (BRILINTA) 90 MG TABS tablet, Take 90 mg by mouth 2 (two) times daily., Disp: , Rfl:    triamterene-hydrochlorothiazide (DYAZIDE) 37.5-25 MG capsule, Take 1 capsule by mouth daily., Disp: , Rfl:  Social History   Socioeconomic History   Marital status: Married    Spouse name: Peyton Najjar   Number of children: 3   Years of education: Not on file   Highest education level: Not on file  Occupational History   Occupation: retired  Tobacco Use   Smoking status: Never   Smokeless tobacco: Never  Vaping Use   Vaping Use: Never used   Substance and Sexual Activity   Alcohol use: Never   Drug use: Never   Sexual activity: Not Currently  Other Topics Concern   Not on file  Social History Narrative   Resides at home with her husband.  She is originally from [place] but relocated to [place] and then to [place] in August 2020.  She has a son who lives about 2 hours away in [place] and a sister-in-law who lives in [place].   2 children in [place]   Social Determinants of Health   Financial Resource Strain: Low Risk  (11/29/2021)   Overall Financial Resource Strain (CARDIA)    Difficulty of Paying Living Expenses: Not hard at all  Food Insecurity: No Food Insecurity (01/11/2022)   Hunger Vital Sign    Worried About Running Out of Food in the Last Year: Never true    Ran Out of Food in the Last Year: Never true  Transportation Needs: No Transportation Needs (01/11/2022)   PRAPARE - Administrator, Civil Service (Medical): No    Lack of Transportation (Non-Medical): No  Physical Activity: Inactive (07/20/2021)   Exercise Vital Sign    Days of Exercise per Week: 0 days    Minutes of Exercise per Session: 0 min  Stress: No Stress Concern Present (07/20/2021)   [employer] of Occupational Health - Occupational Stress Questionnaire    Feeling of Stress : Only a little  Social Connections: Moderately Isolated (07/20/2021)   Social Connection and Isolation Panel [NHANES]    Frequency of Communication with Friends and Family: More than three times a week    Frequency of Social Gatherings with Friends and Family: Once a week    Attends Religious Services: Never    Database administrator or Organizations: No    Attends Banker Meetings: Never    Marital Status: Married  Catering manager Violence: Not At Risk (07/20/2021)   Humiliation,  Afraid, Rape, and Kick questionnaire    Fear of Current or Ex-Partner: No    Emotionally Abused: No    Physically Abused: No    Sexually Abused: No    Family History  Problem Relation Age of Onset   Heart disease Mother    Stroke Mother    Hypertension Mother    Cancer Father    Stroke Father     Objective: Office vital signs reviewed. BP (!) 113/58   Pulse (!) 58   Temp 98.6 F (37 C)   Ht 5\' 3"  (1.6 m)   Wt 235 lb (106.6 kg)   SpO2 95%   BMI 41.63 kg/m   Physical Examination:  General: Awake, alert, morbidly obese, No acute distress HEENT: Right TM with persistent perforation at approximately the 7 o'clock position.  No exudates, bleeding.  No erythema Cardio: regular rate and rhythm, S1S2 heard, no murmurs appreciated Pulm: clear to auscultation bilaterally, no wheezes, rhonchi or rales; normal work of breathing on room air Neuro: No nystagmus  Assessment/ Plan: 72 y.o. female   DM type 2 causing vascular disease (HCC) - Plan: Microalbumin / creatinine urine ratio, Bayer DCA Hb A1c Waived, CMP14+EGFR  Hypertension associated with diabetes (HCC) - Plan: CMP14+EGFR  Coronary artery disease involving native coronary artery of native heart with angina pectoris (HCC) - Plan: CMP14+EGFR, Magnesium  Situational anxiety - Plan: ToxASSURE Select 13 (MW), Urine  Reactive depression - Plan: ToxASSURE Select 13 (MW), Urine  Vertigo - Plan: diazepam (VALIUM) 2 MG tablet, ToxASSURE Select 13 (MW), Urine  Moderate persistent asthma without complication - Plan: albuterol (VENTOLIN HFA) 108 (90 Base) MCG/ACT inhaler  Blood sugar sounds controlled.  Check A1c, urine microalbumin, renal function.  Continue current medications for sugar management  Blood pressure controlled.  No changes.  I do not see a reason to need to resume use of amlodipine given normotension.  Also would hesitate to resume isosorbide given normotensive blood pressure but will CC her cardiologist as Lorain Childes for further recommendations.  For now continue lisinopril, triamterene-hydrochlorothiazide.  Okay to discontinue potassium as long as potassium is normal on  today's labs.  Continue beta-blocker as directed.  UDS and CSC were obtained as per office policy.  Valium renewed for as needed use for vertigo.  Use sparingly.  Keep ENT follow-up, neurologic follow-ups etc.    No orders of the defined types were placed in this encounter.  No orders of the defined types were placed in this encounter.    Raliegh Ip, DO [facility] [phone]

## 2022-06-30 LAB — CMP14+EGFR
ALT: 14 IU/L (ref 0–32)
AST: 16 IU/L (ref 0–40)
Albumin/Globulin Ratio: 1.3 (ref 1.2–2.2)
Albumin: 4.2 g/dL (ref 3.8–4.8)
Alkaline Phosphatase: 61 IU/L (ref 44–121)
BUN/Creatinine Ratio: 23 (ref 12–28)
BUN: 33 mg/dL — ABNORMAL HIGH (ref 8–27)
Bilirubin Total: 0.4 mg/dL (ref 0.0–1.2)
CO2: 20 mmol/L (ref 20–29)
Calcium: 9 mg/dL (ref 8.7–10.3)
Chloride: 99 mmol/L (ref 96–106)
Creatinine, Ser: 1.45 mg/dL — ABNORMAL HIGH (ref 0.57–1.00)
Globulin, Total: 3.2 g/dL (ref 1.5–4.5)
Glucose: 83 mg/dL (ref 70–99)
Potassium: 5 mmol/L (ref 3.5–5.2)
Sodium: 135 mmol/L (ref 134–144)
Total Protein: 7.4 g/dL (ref 6.0–8.5)
eGFR: 39 mL/min/{1.73_m2} — ABNORMAL LOW (ref >59)

## 2022-06-30 LAB — MAGNESIUM: Magnesium: 1.9 mg/dL (ref 1.6–2.3)

## 2022-06-30 LAB — MICROALBUMIN / CREATININE URINE RATIO
Creatinine, Urine: 49.4 mg/dL
Microalb/Creat Ratio: <6 mg/g creat (ref 0–29)
Microalbumin, Urine: <3 ug/mL

## 2022-07-02 LAB — TOXASSURE SELECT 13 (MW), URINE

## 2022-07-09 ENCOUNTER — Encounter: Payer: Self-pay | Admitting: Family Medicine

## 2022-07-09 DIAGNOSIS — N1832 Chronic kidney disease, stage 3b: Secondary | ICD-10-CM

## 2022-07-12 ENCOUNTER — Other Ambulatory Visit: Payer: Self-pay | Admitting: Cardiology

## 2022-07-12 ENCOUNTER — Other Ambulatory Visit: Payer: Self-pay | Admitting: Family Medicine

## 2022-07-12 DIAGNOSIS — I152 Hypertension secondary to endocrine disorders: Secondary | ICD-10-CM

## 2022-07-18 ENCOUNTER — Other Ambulatory Visit: Payer: Self-pay | Admitting: Family Medicine

## 2022-07-20 MED ORDER — IPRATROPIUM-ALBUTEROL 0.5-2.5 (3) MG/3ML IN SOLN: RESPIRATORY_TRACT | 0 refills | Status: DC

## 2022-07-20 NOTE — Telephone Encounter (Signed)
Refill failed. resent °

## 2022-07-20 NOTE — Addendum Note (Signed)
Addended by: Julious Payer D on: 07/20/2022 07:55 AM   Modules accepted: Orders

## 2022-07-29 NOTE — Telephone Encounter (Signed)
Can you check in on this?

## 2022-08-01 ENCOUNTER — Ambulatory Visit (INDEPENDENT_AMBULATORY_CARE_PROVIDER_SITE_OTHER): Payer: Medicare HMO

## 2022-08-01 VITALS — Ht 63.0 in | Wt 235.0 lb

## 2022-08-01 DIAGNOSIS — Z0000 Encounter for general adult medical examination without abnormal findings: Secondary | ICD-10-CM | POA: Diagnosis not present

## 2022-08-01 DIAGNOSIS — Z1211 Encounter for screening for malignant neoplasm of colon: Secondary | ICD-10-CM

## 2022-08-01 DIAGNOSIS — Z0100 Encounter for examination of eyes and vision without abnormal findings: Secondary | ICD-10-CM

## 2022-08-01 NOTE — Progress Notes (Signed)
Subjective:   Amber Reeves is a 72 y.o. female who presents for Medicare Annual (Subsequent) preventive examination. I connected with  Kmya Reeves on 08/01/22 by a audio enabled telemedicine application and verified that I am speaking with the correct person using two identifiers.  Patient Location: Home  Provider Location: Home Office  I discussed the limitations of evaluation and management by telemedicine. The patient expressed understanding and agreed to proceed.  Review of Systems     Cardiac Risk Factors include: advanced age (>93men, >91 women)     Objective:    Today's Vitals   08/01/22 0919  Weight: 235 lb (106.6 kg)  Height: 5\' 3"  (1.6 m)   Body mass index is 41.63 kg/m.     08/01/2022    9:25 AM 05/13/2022    9:48 AM 12/29/2021    6:04 AM 11/17/2021    1:17 PM 10/21/2021   10:16 AM 07/20/2021    2:59 PM 09/22/2020    5:47 AM  Advanced Directives  Does Patient Have a Medical Advance Directive? No No No No No No No  Would patient like information on creating a medical advance directive? No - Patient declined No - Patient declined  No - Patient declined No - Patient declined No - Patient declined No - Patient declined    Current Medications (verified) Outpatient Encounter Medications as of 08/01/2022  Medication Sig   albuterol (VENTOLIN HFA) 108 (90 Base) MCG/ACT inhaler TAKE 2 PUFFS BY MOUTH EVERY 4 HOURS AS NEEDED FOR WHEEZE   Alcohol Swabs (B-D SINGLE USE SWABS REGULAR) PADS Apply topically.   aspirin EC 81 MG tablet Take 81 mg by mouth in the morning.   atorvastatin (LIPITOR) 40 MG tablet Take 1 tablet (40 mg total) by mouth at bedtime.   Blood Glucose Calibration (ACCU-CHEK AVIVA) SOLN 2 Bottles by Other route as needed.   Budeson-Glycopyrrol-Formoterol (BREZTRI AEROSPHERE) 160-9-4.8 MCG/ACT AERO Inhale 2 puffs into the lungs 2 (two) times daily.   budesonide (PULMICORT) 0.25 MG/2ML nebulizer solution Take 2 mLs (0.25 mg total) by nebulization 2  (two) times daily. (Patient taking differently: Take 0.25 mg by nebulization 2 (two) times daily as needed (shortness of breath).)   Calcium Carb-Cholecalciferol (CALCIUM 600+D3 PO) Take 1 tablet by mouth in the morning.   cetirizine (ZYRTEC ALLERGY) 10 MG tablet Take 1 tablet (10 mg total) by mouth daily.   diazepam (VALIUM) 2 MG tablet Take 1 tablet (2 mg total) by mouth daily as needed (vertigo that is not relieved by meclizine).   diphenhydrAMINE (BENADRYL) 25 MG tablet Take 50 mg by mouth daily as needed for allergies.   fluticasone (FLONASE) 50 MCG/ACT nasal spray SPRAY 2 SPRAYS INTO EACH NOSTRIL EVERY DAY   glucose blood test strip TEST BLOOD SUGAR TWICE DAILY   ibuprofen (ADVIL) 200 MG tablet Take 400-600 mg by mouth daily as needed (pain (leg pain)).   insulin degludec (TRESIBA FLEXTOUCH) 200 UNIT/ML FlexTouch Pen Inject 40 Units into the skin daily.   Insulin Syringe-Needle U-100 31G X 5/16" 0.5 ML MISC Use to inject Insulin four times daily: Use as directed; Dx: E11.9, E66.9   ipratropium-albuterol (DUONEB) 0.5-2.5 (3) MG/3ML SOLN INHALE CONTENTS OF 1 VIAL VIA NEBULIZER EVERY 6 HOURS AS NEEDED FOR WHEEZING   lansoprazole (PREVACID) 15 MG capsule Take 30 mg by mouth at bedtime.   lisinopril (ZESTRIL) 20 MG tablet TAKE 1 TABLET BY MOUTH EVERY DAY   meclizine (ANTIVERT) 25 MG tablet Take 1 tablet (25 mg  total) by mouth 3 (three) times daily as needed for up to 30 doses for dizziness.   metoprolol tartrate (LOPRESSOR) 25 MG tablet TAKE 1 TABLET BY MOUTH TWICE A DAY   montelukast (SINGULAIR) 10 MG tablet TAKE 1 TABLET BY MOUTH EVERYDAY AT BEDTIME   nitroGLYCERIN (NITROSTAT) 0.4 MG SL tablet Place 1 tablet (0.4 mg total) under the tongue every 5 (five) minutes x 3 doses as needed for chest pain (if no relief after 2nd dose, proceed to ED or call 911).   pioglitazone (ACTOS) 15 MG tablet Take 1 tablet (15 mg total) by mouth daily. Note dose change   triamterene-hydrochlorothiazide (DYAZIDE)  37.5-25 MG capsule Take 1 each (1 capsule total) by mouth daily.   No facility-administered encounter medications on file as of 08/01/2022.    Allergies (verified) Aspartame, Penicillins, Farxiga [dapagliflozin], Glucose, Metformin, Metformin and related, No healthtouch food allergies, Paxil [paroxetine hcl], Prozac [fluoxetine hcl], Zoloft [sertraline hcl], Prednisone, and Ranexa [ranolazine]   History: Past Medical History:  Diagnosis Date   Allergy    Anxiety    Arthritis    Asthma    Coronary artery disease    Depression    Diabetes mellitus without complication (HCC)    Dyspnea    GERD (gastroesophageal reflux disease)    Hyperlipidemia    Hypertension    Pneumonia    Past Surgical History:  Procedure Laterality Date   ABDOMINAL HYSTERECTOMY     BREAST EXCISIONAL BIOPSY     CESAREAN SECTION     3   CHOLECYSTECTOMY  2018   CORONARY STENT PLACEMENT  10/2017   [place] [facility] hospital)   IR 3D INDEPENDENT WKST  11/26/2021   IR ANGIO INTRA EXTRACRAN SEL COM CAROTID INNOMINATE UNI R MOD SED  10/21/2021   IR ANGIO INTRA EXTRACRAN SEL COM CAROTID INNOMINATE UNI R MOD SED  05/13/2022   IR ANGIO INTRA EXTRACRAN SEL INTERNAL CAROTID UNI L MOD SED  10/21/2021   IR ANGIO INTRA EXTRACRAN SEL INTERNAL CAROTID UNI R MOD SED  11/26/2021   IR ANGIO VERTEBRAL SEL VERTEBRAL UNI L MOD SED  10/21/2021   IR ANGIOGRAM FOLLOW UP STUDY  11/26/2021   IR ANGIOGRAM FOLLOW UP STUDY  11/26/2021   IR ANGIOGRAM FOLLOW UP STUDY  11/26/2021   IR TRANSCATH/EMBOLIZ  11/26/2021   LEFT HEART CATH AND CORONARY ANGIOGRAPHY N/A 02/25/2019   Procedure: LEFT HEART CATH AND CORONARY ANGIOGRAPHY;  Surgeon: Yvonne Kendall, MD;  Location: [facility] INVASIVE CV LAB;  Service: Cardiovascular;  Laterality: N/A;   RADIOLOGY WITH ANESTHESIA N/A 11/26/2021   Procedure: 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;  Surgeon: Lisbeth Renshaw, MD;  Location: [facility] OR;  Service: Radiology;  Laterality: N/A;   REPLACEMENT  TOTAL KNEE BILATERAL Bilateral    done in [place] (2003/2005)   RIGHT/LEFT HEART CATH AND CORONARY ANGIOGRAPHY N/A 09/22/2020   Procedure: RIGHT/LEFT HEART CATH AND CORONARY ANGIOGRAPHY;  Surgeon: Marykay Lex, MD;  Location: [facility] INVASIVE CV LAB;  Service: Cardiovascular;  Laterality: N/A;   TUBAL LIGATION     Family History  Problem Relation Age of Onset   Heart disease Mother    Stroke Mother    Hypertension Mother    Cancer Father    Stroke Father    Social History   Socioeconomic History   Marital status: Married    Spouse name: Peyton Najjar   Number of children: 3   Years of education: Not on file   Highest education level: Not on file  Occupational History   Occupation: retired  Tobacco Use   Smoking status: Never   Smokeless tobacco: Never  Vaping Use   Vaping Use: Never used  Substance and Sexual Activity   Alcohol use: Never   Drug use: Never   Sexual activity: Not Currently  Other Topics Concern   Not on file  Social History Narrative   Resides at home with her husband.  She is originally from [place] but relocated to [place] and then to [place] in August 2020.  She has a son who lives about 2 hours away in [place] and a sister-in-law who lives in [place].   2 children in [place]   Social Determinants of Health   Financial Resource Strain: Low Risk  (08/01/2022)   Overall Financial Resource Strain (CARDIA)    Difficulty of Paying Living Expenses: Not hard at all  Food Insecurity: No Food Insecurity (08/01/2022)   Hunger Vital Sign    Worried About Running Out of Food in the Last Year: Never true    Ran Out of Food in the Last Year: Never true  Transportation Needs: No Transportation Needs (08/01/2022)   PRAPARE - Administrator, Civil Service (Medical): No    Lack of Transportation (Non-Medical): No  Physical Activity: Inactive (08/01/2022)   Exercise Vital Sign    Days of Exercise per Week: 0 days    Minutes of Exercise per Session: 0 min   Stress: No Stress Concern Present (08/01/2022)   [employer] of Occupational Health - Occupational Stress Questionnaire    Feeling of Stress : Not at all  Social Connections: Moderately Isolated (08/01/2022)   Social Connection and Isolation Panel [NHANES]    Frequency of Communication with Friends and Family: More than three times a week    Frequency of Social Gatherings with Friends and Family: More than three times a week    Attends Religious Services: Never    Database administrator or Organizations: No    Attends Engineer, structural: Never    Marital Status: Married    Tobacco Counseling Counseling given: Not Answered   Clinical Intake:  Pre-visit preparation completed: Yes  Pain : No/denies pain     Nutritional Risks: None Diabetes: Yes CBG done?: No Did pt. bring in CBG monitor from home?: No  How often do you need to have someone help you when you read instructions, pamphlets, or other written materials from your doctor or pharmacy?: 1 - Never  Diabetic?yes  Nutrition Risk Assessment:  Has the patient had any N/V/D within the last 2 months?  No  Does the patient have any non-healing wounds?  No  Has the patient had any unintentional weight loss or weight gain?  No   Diabetes:  Is the patient diabetic?  Yes  If diabetic, was a CBG obtained today?  No  Did the patient bring in their glucometer from home?  No  How often do you monitor your CBG's? Daily .   Financial Strains and Diabetes Management:  Are you having any financial strains with the device, your supplies or your medication? No .  Does the patient want to be seen by Chronic Care Management for management of their diabetes?  No  Would the patient like to be referred to a Nutritionist or for Diabetic Management?  No   Diabetic Exams:  Diabetic Eye Exam: Overdue for diabetic eye exam. Pt has been advised about the importance in completing this exam. Patient advised to call  and  schedule an eye exam. Diabetic Foot Exam: Overdue, Pt has been advised about the importance in completing this exam. Pt is scheduled for diabetic foot exam on next office visit .   Interpreter Needed?: No  Information entered by :: Renie Ora, LPN   Activities of Daily Living    08/01/2022    9:26 AM 11/17/2021    1:19 PM  In your present state of health, do you have any difficulty performing the following activities:  Hearing? 0   Vision? 0   Difficulty concentrating or making decisions? 0   Walking or climbing stairs? 0   Dressing or bathing? 0   Doing errands, shopping? 0 0  Preparing Food and eating ? N   Using the Toilet? N   In the past six months, have you accidently leaked urine? N   Do you have problems with loss of bowel control? N   Managing your Medications? N   Managing your Finances? N   Housekeeping or managing your Housekeeping? N     Patient Care Team: Raliegh Ip, DO as PCP - General (Family Medicine) Wyline Mood Dorothe Pea, MD as PCP - Cardiology (Cardiology) Roma Kayser, MD as Consulting Physician (Endocrinology) Danella Maiers, [facility] as Pharmacist (Family Medicine) Coralyn Helling, MD as Consulting Physician (Pulmonary Disease) Lisbeth Renshaw, MD as Consulting Physician (Neurosurgery) Christia Reading, MD as Consulting Physician (Otolaryngology)  Indicate any recent Medical Services you may have received from other than [facility] providers in the past year (date may be approximate).     Assessment:   This is a routine wellness examination for [place].  Hearing/Vision screen Vision Screening - Comments:: Referral 08/01/2022  Dietary issues and exercise activities discussed: Current Exercise Habits: The patient does not participate in regular exercise at present, Exercise limited by: Other - see comments (Vertigo)   Goals Addressed             This Visit's Progress    DIET - INCREASE WATER INTAKE   On track    Increase to 6-8 glasses  daily       Depression Screen    08/01/2022    9:24 AM 06/29/2022    1:05 PM 02/11/2022    9:09 AM 01/07/2022    9:10 AM 08/30/2021   12:58 PM 07/20/2021    2:57 PM 07/06/2021   11:54 AM  PHQ 2/9 Scores  PHQ - 2 Score 0 2 5 4 3 1    PHQ- 9 Score 0 7 14 8 5 2    Exception Documentation       Patient refusal    Fall Risk    08/01/2022    9:22 AM 02/11/2022    9:09 AM 01/07/2022    9:10 AM 08/30/2021   12:58 PM 07/21/2021    1:07 PM  Fall Risk   Falls in the past year? 0 0 0 0 0  Number falls in past yr: 0      Injury with Fall? 0      Risk for fall due to : No Fall Risks      Follow up Falls prevention discussed        FALL RISK PREVENTION PERTAINING TO THE HOME:  Any stairs in or around the home? No  If so, are there any without handrails? No  Home free of loose throw rugs in walkways, pet beds, electrical cords, etc? Yes  Adequate lighting in your home to reduce risk of falls? Yes   ASSISTIVE DEVICES UTILIZED  TO PREVENT FALLS:  Life alert? No  Use of a cane, walker or w/c? Yes  Grab bars in the bathroom? Yes  Shower chair or bench in shower? Yes  Elevated toilet seat or a handicapped toilet? Yes   :        08/01/2022    9:26 AM 07/20/2021    3:03 PM 07/16/2020    4:12 PM  6CIT Screen  What Year? 0 points 0 points 0 points  What month? 0 points 0 points 0 points  What time? 0 points 0 points 0 points  Count back from 20 0 points 0 points 0 points  Months in reverse 0 points 0 points 0 points  Repeat phrase 0 points 0 points 0 points  Total Score 0 points 0 points 0 points    Immunizations Immunization History  Administered Date(s) Administered   Influenza, High Dose Seasonal PF 02/16/2017   Influenza,inj,Quad PF,6+ Mos 12/18/2013   Moderna Sars-Covid-2 Vaccination 05/01/2019, 05/29/2019, 02/18/2020    TDAP status: Due, Education has been provided regarding the importance of this vaccine. Advised may receive this vaccine at local pharmacy or Health Dept.  Aware to provide a copy of the vaccination record if obtained from local pharmacy or Health Dept. Verbalized acceptance and understanding.  Flu Vaccine status: Declined, Education has been provided regarding the importance of this vaccine but patient still declined. Advised may receive this vaccine at local pharmacy or Health Dept. Aware to provide a copy of the vaccination record if obtained from local pharmacy or Health Dept. Verbalized acceptance and understanding.  Pneumococcal vaccine status: Due, Education has been provided regarding the importance of this vaccine. Advised may receive this vaccine at local pharmacy or Health Dept. Aware to provide a copy of the vaccination record if obtained from local pharmacy or Health Dept. Verbalized acceptance and understanding.  Covid-19 vaccine status: Completed vaccines  Qualifies for Shingles Vaccine? Yes   Zostavax completed No   Shingrix Completed?: No.    Education has been provided regarding the importance of this vaccine. Patient has been advised to call insurance company to determine out of pocket expense if they have not yet received this vaccine. Advised may also receive vaccine at local pharmacy or Health Dept. Verbalized acceptance and understanding.  Screening Tests Health Maintenance  Topic Date Due   Colonoscopy  Never done   OPHTHALMOLOGY EXAM  08/25/2021   COVID-19 Vaccine (4 - 2023-24 season) 10/22/2021   DEXA SCAN  07/18/2022   Pneumonia Vaccine 72+ Years old (1 of 1 - PCV) 08/31/2022 (Originally 12/18/2015)   Hepatitis C Screening  08/31/2022 (Originally 12/17/1968)   Zoster Vaccines- Shingrix (1 of 2) 09/29/2022 (Originally 12/17/2000)   MAMMOGRAM  06/29/2023 (Originally 09/19/2021)   INFLUENZA VACCINE  09/22/2022   HEMOGLOBIN A1C  12/30/2022   Diabetic kidney evaluation - eGFR measurement  06/29/2023   Diabetic kidney evaluation - Urine ACR  06/29/2023   FOOT EXAM  06/29/2023   Medicare Annual Wellness (AWV)  08/01/2023    HPV VACCINES  Aged Out   DTaP/Tdap/Td  Discontinued    Health Maintenance  Health Maintenance Due  Topic Date Due   Colonoscopy  Never done   OPHTHALMOLOGY EXAM  08/25/2021   COVID-19 Vaccine (4 - 2023-24 season) 10/22/2021   DEXA SCAN  07/18/2022    Colorectal cancer screening: Referral to GI placed 08/01/2022. Pt aware the office will call re: appt.  Mammogram status: Ordered declined . Pt provided with contact info and advised to call to  schedule appt.   Bone Density status: Ordered declined . Pt provided with contact info and advised to call to schedule appt.  Lung Cancer Screening: (Low Dose CT Chest recommended if Age 50-80 years, 30 pack-year currently smoking OR have quit w/in 15years.) does not qualify.   Lung Cancer Screening Referral: n/a  Additional Screening:  Hepatitis C Screening: does not qualify;  Vision Screening: Recommended annual ophthalmology exams for early detection of glaucoma and other disorders of the eye. Is the patient up to date with their annual eye exam?  No  Who is the provider or what is the name of the office in which the patient attends annual eye exams? None referral 08/01/2022 If pt is not established with a provider, would they like to be referred to a provider to establish care? No .   Dental Screening: Recommended annual dental exams for proper oral hygiene  Community Resource Referral / Chronic Care Management: CRR required this visit?  No   CCM required this visit?  No      Plan:     I have personally reviewed and noted the following in the patient's chart:   Medical and social history Use of alcohol, tobacco or illicit drugs  Current medications and supplements including opioid prescriptions. Patient is not currently taking opioid prescriptions. Functional ability and status Nutritional status Physical activity Advanced directives List of other physicians Hospitalizations, surgeries, and ER visits in previous 12  months Vitals Screenings to include cognitive, depression, and falls Referrals and appointments  In addition, I have reviewed and discussed with patient certain preventive protocols, quality metrics, and best practice recommendations. A written personalized care plan for preventive services as well as general preventive health recommendations were provided to patient.     Lorrene Reid, LPN   1/61/0960   Nurse Notes: Due TDAp / Pneumonia vaccine, declines Mammogram / colonoscopy / DEXA per patient until she feels better .

## 2022-08-01 NOTE — Patient Instructions (Signed)
Amber Reeves , Thank you for taking time to come for your Medicare Wellness Visit. I appreciate your ongoing commitment to your health goals. Please review the following plan we discussed and let me know if I can assist you in the future.   These are the goals we discussed:  Goals       DIET - INCREASE WATER INTAKE      Increase to 6-8 glasses daily      manage shoulder pain (THN) (pt-stated)      Care Coordination Interventions: Discussed importance of adherence to all scheduled medical appointments Counseled on the importance of reporting any/all new or changed pain symptoms or management strategies to pain management provider Discussed use of relaxation techniques and/or diversional activities to assist with pain reduction (distraction, imagery, relaxation, massage, acupressure, TENS, heat, and cold application Reviewed with patient prescribed pharmacological and nonpharmacological pain relief strategies Screening for signs and symptoms of depression related to chronic disease state  Assessed social determinant of health barriers Outreach to [employer] per secure chat about patient voiced concerns       T2DM, ASTHMA, HLD (pt-stated)      Current Barriers:  Unable to independently afford treatment regimen Unable to maintain control of T2DM Suboptimal therapeutic regimen for T2DM  Pharmacist Clinical Goal(s):  patient will verbalize ability to afford treatment regimen maintain control of T2DM, HLD as evidenced by GOAL A1C, MORE EFFICACIOUS REGIMEN  through collaboration with PharmD and provider.   Interventions: 1:1 collaboration with Raliegh Ip, DO regarding development and update of comprehensive plan of care as evidenced by provider attestation and co-signature Inter-disciplinary care team collaboration (see longitudinal plan of care) Comprehensive medication review performed; medication list updated in electronic medical record  Diabetes: Goal on Track (progressing):  YES. Controlled-A1C 6.1%; current treatment: TRESIBA 32 units daily, PIOGLITAZONE;  Would like to optimize regimen to provide CV, kidney benefit, however patient intolerant list makes this challenging Considering GLP1 therapy to d/c pioglitazone CONTINUE TRESIBA, pioglitazone FBG 70-120; denied hypoglycemia Current glucose readings: fasting glucose: n/a' post prandial glucose: n/a FBG 70-120 No more concern for hypoglycemia Denies hypoglycemic/hyperglycemic symptoms Discussed meal planning options and Plate method for healthy eating Avoid sugary drinks and desserts Incorporate balanced protein, non starchy veggies, 1 serving of carbohydrate with each meal Increase water intake Increase physical activity as able Current exercise: encouraged Educated on diet, medications Assessed patient finances. Will enroll in [employer] patient assistance for breztri inhaler  Hyperlipidemia:  Goal on Track (progressing): YES. Controlled; current treatment:atorvastatin;  Medications previously tried: n/a  Current dietary patterns: ENCOURAGED HEART HEALTHY to decrease triglycerides/cholesterol Educated on DIET/EXERICISE  ASTHMA -symbicort-->Breztri (plus has nebs on board when unable to use inhalers)--enrolled in [employer] patient assistance program; RE-ENROLLMENT SENT FOR 2023 TO [employer] -Uses rescue albuterol occasionally -Uses CPAP at night -continue current management per pulm  Patient Goals/Self-Care Activities patient will:  - take medications as prescribed as evidenced by patient report and record review check glucose DAILY/FASTING, document, and provide at future appointments collaborate with provider on medication access solutions target a minimum of 150 minutes of moderate intensity exercise weekly engage in dietary modifications by FOLLOWING HEART HEALTHY DIET         This is a list of the screening recommended for you and due dates:  Health Maintenance  Topic Date Due   Colon Cancer  Screening  Never done   Eye exam for diabetics  08/25/2021   COVID-19 Vaccine (4 - 2023-24 season) 10/22/2021   DEXA scan (  bone density measurement)  07/18/2022   Pneumonia Vaccine (1 of 1 - PCV) 08/31/2022*   Hepatitis C Screening  08/31/2022*   Zoster (Shingles) Vaccine (1 of 2) 09/29/2022*   Mammogram  06/29/2023*   Flu Shot  09/22/2022   Hemoglobin A1C  12/30/2022   Yearly kidney function blood test for diabetes  06/29/2023   Yearly kidney health urinalysis for diabetes  06/29/2023   Complete foot exam   06/29/2023   Medicare Annual Wellness Visit  08/01/2023   HPV Vaccine  Aged Out   DTaP/Tdap/Td vaccine  Discontinued  *Topic was postponed. The date shown is not the original due date.    Advanced directives: Advance directive discussed with you today. I have provided a copy for you to complete at home and have notarized. Once this is complete please bring a copy in to our office so we can scan it into your chart.   Conditions/risks identified: Aim for 30 minutes of exercise or brisk walking, 6-8 glasses of water, and 5 servings of fruits and vegetables each day.   Next appointment: Follow up in one year for your annual wellness visit    Preventive Care 65 Years and Older, Female Preventive care refers to lifestyle choices and visits with your health care provider that can promote health and wellness. What does preventive care include? A yearly physical exam. This is also called an annual well check. Dental exams once or twice a year. Routine eye exams. Ask your health care provider how often you should have your eyes checked. Personal lifestyle choices, including: Daily care of your teeth and gums. Regular physical activity. Eating a healthy diet. Avoiding tobacco and drug use. Limiting alcohol use. Practicing safe sex. Taking low-dose aspirin every day. Taking vitamin and mineral supplements as recommended by your health care provider. What happens during an annual well  check? The services and screenings done by your health care provider during your annual well check will depend on your age, overall health, lifestyle risk factors, and family history of disease. Counseling  Your health care provider may ask you questions about your: Alcohol use. Tobacco use. Drug use. Emotional well-being. Home and relationship well-being. Sexual activity. Eating habits. History of falls. Memory and ability to understand (cognition). Work and work Astronomer. Reproductive health. Screening  You may have the following tests or measurements: Height, weight, and BMI. Blood pressure. Lipid and cholesterol levels. These may be checked every 5 years, or more frequently if you are over 50 years old. Skin check. Lung cancer screening. You may have this screening every year starting at age 41 if you have a 30-pack-year history of smoking and currently smoke or have quit within the past 15 years. Fecal occult blood test (FOBT) of the stool. You may have this test every year starting at age 58. Flexible sigmoidoscopy or colonoscopy. You may have a sigmoidoscopy every 5 years or a colonoscopy every 10 years starting at age 75. Hepatitis C blood test. Hepatitis B blood test. Sexually transmitted disease (STD) testing. Diabetes screening. This is done by checking your blood sugar (glucose) after you have not eaten for a while (fasting). You may have this done every 1-3 years. Bone density scan. This is done to screen for osteoporosis. You may have this done starting at age 38. Mammogram. This may be done every 1-2 years. Talk to your health care provider about how often you should have regular mammograms. Talk with your health care provider about your test results, treatment options, and  if necessary, the need for more tests. Vaccines  Your health care provider may recommend certain vaccines, such as: Influenza vaccine. This is recommended every year. Tetanus, diphtheria, and  acellular pertussis (Tdap, Td) vaccine. You may need a Td booster every 10 years. Zoster vaccine. You may need this after age 32. Pneumococcal 13-valent conjugate (PCV13) vaccine. One dose is recommended after age 45. Pneumococcal polysaccharide (PPSV23) vaccine. One dose is recommended after age 52. Talk to your health care provider about which screenings and vaccines you need and how often you need them. This information is not intended to replace advice given to you by your health care provider. Make sure you discuss any questions you have with your health care provider. Document Released: 03/06/2015 Document Revised: 10/28/2015 Document Reviewed: 12/09/2014 Elsevier Interactive Patient Education  2017 [employer].  Fall Prevention in the Home Falls can cause injuries. They can happen to people of all ages. There are many things you can do to make your home safe and to help prevent falls. What can I do on the outside of my home? Regularly fix the edges of walkways and driveways and fix any cracks. Remove anything that might make you trip as you walk through a door, such as a raised step or threshold. Trim any bushes or trees on the path to your home. Use bright outdoor lighting. Clear any walking paths of anything that might make someone trip, such as rocks or tools. Regularly check to see if handrails are loose or broken. Make sure that both sides of any steps have handrails. Any raised decks and porches should have guardrails on the edges. Have any leaves, snow, or ice cleared regularly. Use sand or salt on walking paths during winter. Clean up any spills in your garage right away. This includes oil or grease spills. What can I do in the bathroom? Use night lights. Install grab bars by the toilet and in the tub and shower. Do not use towel bars as grab bars. Use non-skid mats or decals in the tub or shower. If you need to sit down in the shower, use a plastic, non-slip stool. Keep  the floor dry. Clean up any water that spills on the floor as soon as it happens. Remove soap buildup in the tub or shower regularly. Attach bath mats securely with double-sided non-slip rug tape. Do not have throw rugs and other things on the floor that can make you trip. What can I do in the bedroom? Use night lights. Make sure that you have a light by your bed that is easy to reach. Do not use any sheets or blankets that are too big for your bed. They should not hang down onto the floor. Have a firm chair that has side arms. You can use this for support while you get dressed. Do not have throw rugs and other things on the floor that can make you trip. What can I do in the kitchen? Clean up any spills right away. Avoid walking on wet floors. Keep items that you use a lot in easy-to-reach places. If you need to reach something above you, use a strong step stool that has a grab bar. Keep electrical cords out of the way. Do not use floor polish or wax that makes floors slippery. If you must use wax, use non-skid floor wax. Do not have throw rugs and other things on the floor that can make you trip. What can I do with my stairs? Do not leave any  items on the stairs. Make sure that there are handrails on both sides of the stairs and use them. Fix handrails that are broken or loose. Make sure that handrails are as long as the stairways. Check any carpeting to make sure that it is firmly attached to the stairs. Fix any carpet that is loose or worn. Avoid having throw rugs at the top or bottom of the stairs. If you do have throw rugs, attach them to the floor with carpet tape. Make sure that you have a light switch at the top of the stairs and the bottom of the stairs. If you do not have them, ask someone to add them for you. What else can I do to help prevent falls? Wear shoes that: Do not have high heels. Have rubber bottoms. Are comfortable and fit you well. Are closed at the toe. Do not  wear sandals. If you use a stepladder: Make sure that it is fully opened. Do not climb a closed stepladder. Make sure that both sides of the stepladder are locked into place. Ask someone to hold it for you, if possible. Clearly mark and make sure that you can see: Any grab bars or handrails. First and last steps. Where the edge of each step is. Use tools that help you move around (mobility aids) if they are needed. These include: Canes. Walkers. Scooters. Crutches. Turn on the lights when you go into a dark area. Replace any light bulbs as soon as they burn out. Set up your furniture so you have a clear path. Avoid moving your furniture around. If any of your floors are uneven, fix them. If there are any pets around you, be aware of where they are. Review your medicines with your doctor. Some medicines can make you feel dizzy. This can increase your chance of falling. Ask your doctor what other things that you can do to help prevent falls. This information is not intended to replace advice given to you by your health care provider. Make sure you discuss any questions you have with your health care provider. Document Released: 12/04/2008 Document Revised: 07/16/2015 Document Reviewed: 03/14/2014 Elsevier Interactive Patient Education  2017 [employer].

## 2022-08-09 DIAGNOSIS — H903 Sensorineural hearing loss, bilateral: Secondary | ICD-10-CM | POA: Diagnosis not present

## 2022-08-09 DIAGNOSIS — H8190 Unspecified disorder of vestibular function, unspecified ear: Secondary | ICD-10-CM | POA: Diagnosis not present

## 2022-08-15 DIAGNOSIS — E1122 Type 2 diabetes mellitus with diabetic chronic kidney disease: Secondary | ICD-10-CM | POA: Diagnosis not present

## 2022-08-15 DIAGNOSIS — I2510 Atherosclerotic heart disease of native coronary artery without angina pectoris: Secondary | ICD-10-CM | POA: Diagnosis not present

## 2022-08-15 DIAGNOSIS — I129 Hypertensive chronic kidney disease with stage 1 through stage 4 chronic kidney disease, or unspecified chronic kidney disease: Secondary | ICD-10-CM | POA: Diagnosis not present

## 2022-08-15 DIAGNOSIS — E782 Mixed hyperlipidemia: Secondary | ICD-10-CM | POA: Diagnosis not present

## 2022-08-21 ENCOUNTER — Other Ambulatory Visit: Payer: Self-pay | Admitting: Family Medicine

## 2022-08-21 DIAGNOSIS — I152 Hypertension secondary to endocrine disorders: Secondary | ICD-10-CM

## 2022-08-21 DIAGNOSIS — I25119 Atherosclerotic heart disease of native coronary artery with unspecified angina pectoris: Secondary | ICD-10-CM

## 2022-08-24 DIAGNOSIS — H9313 Tinnitus, bilateral: Secondary | ICD-10-CM | POA: Diagnosis not present

## 2022-08-24 DIAGNOSIS — R2689 Other abnormalities of gait and mobility: Secondary | ICD-10-CM | POA: Diagnosis not present

## 2022-08-24 DIAGNOSIS — Z9181 History of falling: Secondary | ICD-10-CM | POA: Diagnosis not present

## 2022-08-24 DIAGNOSIS — R42 Dizziness and giddiness: Secondary | ICD-10-CM | POA: Diagnosis not present

## 2022-08-29 DIAGNOSIS — I5032 Chronic diastolic (congestive) heart failure: Secondary | ICD-10-CM | POA: Diagnosis not present

## 2022-08-29 DIAGNOSIS — D638 Anemia in other chronic diseases classified elsewhere: Secondary | ICD-10-CM | POA: Diagnosis not present

## 2022-08-29 DIAGNOSIS — I2510 Atherosclerotic heart disease of native coronary artery without angina pectoris: Secondary | ICD-10-CM | POA: Diagnosis not present

## 2022-08-29 DIAGNOSIS — E1122 Type 2 diabetes mellitus with diabetic chronic kidney disease: Secondary | ICD-10-CM | POA: Diagnosis not present

## 2022-08-29 DIAGNOSIS — G4733 Obstructive sleep apnea (adult) (pediatric): Secondary | ICD-10-CM | POA: Diagnosis not present

## 2022-08-29 DIAGNOSIS — N189 Chronic kidney disease, unspecified: Secondary | ICD-10-CM | POA: Diagnosis not present

## 2022-08-29 DIAGNOSIS — I129 Hypertensive chronic kidney disease with stage 1 through stage 4 chronic kidney disease, or unspecified chronic kidney disease: Secondary | ICD-10-CM | POA: Diagnosis not present

## 2022-09-12 DIAGNOSIS — R778 Other specified abnormalities of plasma proteins: Secondary | ICD-10-CM | POA: Diagnosis not present

## 2022-09-12 DIAGNOSIS — E1122 Type 2 diabetes mellitus with diabetic chronic kidney disease: Secondary | ICD-10-CM | POA: Diagnosis not present

## 2022-09-12 DIAGNOSIS — N2581 Secondary hyperparathyroidism of renal origin: Secondary | ICD-10-CM | POA: Diagnosis not present

## 2022-09-12 DIAGNOSIS — E559 Vitamin D deficiency, unspecified: Secondary | ICD-10-CM | POA: Diagnosis not present

## 2022-09-14 DIAGNOSIS — Z9181 History of falling: Secondary | ICD-10-CM | POA: Diagnosis not present

## 2022-09-14 DIAGNOSIS — Z7409 Other reduced mobility: Secondary | ICD-10-CM | POA: Diagnosis not present

## 2022-09-14 DIAGNOSIS — R2681 Unsteadiness on feet: Secondary | ICD-10-CM | POA: Diagnosis not present

## 2022-09-14 DIAGNOSIS — R29898 Other symptoms and signs involving the musculoskeletal system: Secondary | ICD-10-CM | POA: Diagnosis not present

## 2022-09-14 DIAGNOSIS — R42 Dizziness and giddiness: Secondary | ICD-10-CM | POA: Diagnosis not present

## 2022-09-14 DIAGNOSIS — R2689 Other abnormalities of gait and mobility: Secondary | ICD-10-CM | POA: Diagnosis not present

## 2022-09-20 DIAGNOSIS — R42 Dizziness and giddiness: Secondary | ICD-10-CM | POA: Diagnosis not present

## 2022-09-20 DIAGNOSIS — R2681 Unsteadiness on feet: Secondary | ICD-10-CM | POA: Diagnosis not present

## 2022-09-20 DIAGNOSIS — R2689 Other abnormalities of gait and mobility: Secondary | ICD-10-CM | POA: Diagnosis not present

## 2022-09-20 DIAGNOSIS — Z9181 History of falling: Secondary | ICD-10-CM | POA: Diagnosis not present

## 2022-09-20 DIAGNOSIS — R29898 Other symptoms and signs involving the musculoskeletal system: Secondary | ICD-10-CM | POA: Diagnosis not present

## 2022-09-20 DIAGNOSIS — Z7409 Other reduced mobility: Secondary | ICD-10-CM | POA: Diagnosis not present

## 2022-09-26 DIAGNOSIS — R2689 Other abnormalities of gait and mobility: Secondary | ICD-10-CM | POA: Diagnosis not present

## 2022-09-26 DIAGNOSIS — R42 Dizziness and giddiness: Secondary | ICD-10-CM | POA: Diagnosis not present

## 2022-09-26 DIAGNOSIS — R29898 Other symptoms and signs involving the musculoskeletal system: Secondary | ICD-10-CM | POA: Diagnosis not present

## 2022-09-26 DIAGNOSIS — Z9181 History of falling: Secondary | ICD-10-CM | POA: Diagnosis not present

## 2022-09-26 DIAGNOSIS — Z7409 Other reduced mobility: Secondary | ICD-10-CM | POA: Diagnosis not present

## 2022-09-26 DIAGNOSIS — R2681 Unsteadiness on feet: Secondary | ICD-10-CM | POA: Diagnosis not present

## 2022-09-27 ENCOUNTER — Other Ambulatory Visit (HOSPITAL_COMMUNITY): Payer: Self-pay | Admitting: Nephrology

## 2022-09-27 DIAGNOSIS — I129 Hypertensive chronic kidney disease with stage 1 through stage 4 chronic kidney disease, or unspecified chronic kidney disease: Secondary | ICD-10-CM

## 2022-09-27 DIAGNOSIS — E1122 Type 2 diabetes mellitus with diabetic chronic kidney disease: Secondary | ICD-10-CM

## 2022-09-27 DIAGNOSIS — E782 Mixed hyperlipidemia: Secondary | ICD-10-CM

## 2022-09-27 DIAGNOSIS — I5032 Chronic diastolic (congestive) heart failure: Secondary | ICD-10-CM

## 2022-09-28 DIAGNOSIS — R29898 Other symptoms and signs involving the musculoskeletal system: Secondary | ICD-10-CM | POA: Diagnosis not present

## 2022-09-28 DIAGNOSIS — R2681 Unsteadiness on feet: Secondary | ICD-10-CM | POA: Diagnosis not present

## 2022-09-28 DIAGNOSIS — R2689 Other abnormalities of gait and mobility: Secondary | ICD-10-CM | POA: Diagnosis not present

## 2022-09-28 DIAGNOSIS — R42 Dizziness and giddiness: Secondary | ICD-10-CM | POA: Diagnosis not present

## 2022-09-28 DIAGNOSIS — Z7409 Other reduced mobility: Secondary | ICD-10-CM | POA: Diagnosis not present

## 2022-09-28 DIAGNOSIS — Z9181 History of falling: Secondary | ICD-10-CM | POA: Diagnosis not present

## 2022-10-03 ENCOUNTER — Other Ambulatory Visit: Payer: Self-pay

## 2022-10-03 DIAGNOSIS — Z1231 Encounter for screening mammogram for malignant neoplasm of breast: Secondary | ICD-10-CM

## 2022-10-05 ENCOUNTER — Ambulatory Visit: Payer: Medicare HMO | Admitting: Pulmonary Disease

## 2022-10-05 ENCOUNTER — Encounter: Payer: Self-pay | Admitting: Pulmonary Disease

## 2022-10-05 VITALS — BP 128/75 | HR 60 | Ht 63.0 in | Wt 245.0 lb

## 2022-10-05 DIAGNOSIS — J4550 Severe persistent asthma, uncomplicated: Secondary | ICD-10-CM

## 2022-10-05 DIAGNOSIS — R0683 Snoring: Secondary | ICD-10-CM | POA: Diagnosis not present

## 2022-10-05 NOTE — Progress Notes (Signed)
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    Past Surgical History:  She  has a past surgical history that includes Cholecystectomy (2018); Abdominal hysterectomy; Cesarean section; Replacement total knee bilateral (Bilateral); Coronary stent placement (10/2017); LEFT HEART CATH AND CORONARY ANGIOGRAPHY (N/A, 02/25/2019); Breast excisional biopsy; RIGHT/LEFT HEART CATH AND CORONARY ANGIOGRAPHY (N/A, 09/22/2020); IR ANGIO VERTEBRAL SEL VERTEBRAL UNI L MOD SED (10/21/2021); IR ANGIO INTRA EXTRACRAN SEL COM CAROTID INNOMINATE UNI R MOD SED (10/21/2021); IR ANGIO INTRA EXTRACRAN SEL INTERNAL CAROTID UNI L MOD SED (10/21/2021); Tubal ligation; IR Transcath/Emboliz (11/26/2021); IR Angiogram Follow Up Study (11/26/2021); IR ANGIO INTRA EXTRACRAN SEL INTERNAL CAROTID UNI R MOD SED (11/26/2021); IR Angiogram Follow Up Study (11/26/2021); IR Angiogram Follow Up Study (11/26/2021); IR 3D Independent Wkst (11/26/2021); Radiology with anesthesia (N/A, 11/26/2021); and IR ANGIO INTRA EXTRACRAN SEL COM CAROTID INNOMINATE UNI R MOD SED (05/13/2022).  Past Medical History:  Allergies, Anxiety, Arthritis, Asthma, Depression, DM type 2, GERD, HLD, HTN, Lt leg DVT after knee surgery, CAD, Chronic diastolic CHF  Constitutional:  BP 128/75   Pulse 60   Ht 5\' 3"  (1.6 m)   Wt 245 lb (111.1 kg)   SpO2 95%   BMI 43.40 kg/m   Brief Summary:  Amber Reeves is a 72 y.o. female with snoring and dyspnea.      Subjective:   She is here with her husband.  She developed a sinus infection.  This caused hearing loss.  She saw ENT.  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  She had this treated. She then developed vertigo.  Has been working with PM&R for vestibular exercises.  Her nephrologist is worried her renal function is getting worse.  She snores and has trouble sleeping at night.   Wants to get sleep study set up.  Has been able to use breztri.  This helps.  Using singulair at night.  Hasn't needed albuterol much.   Physical Exam:   Appearance - well kempt   ENMT - no sinus tenderness, no oral exudate, no LAN, Mallampati 4 airway, no stridor, wears dentures  Respiratory - equal breath sounds bilaterally, no wheezing or rales  CV - s1s2 regular rate and rhythm, no murmurs  Ext - no clubbing, no edema  Skin - no rashes  Psych - normal mood and affect    Pulmonary testing:  RAST 06/14/21 >> [place], Dust mites; IgE 548 PFT 07/29/21 >> FEV1 1.83 (83%), FEV1% 86, TLC 4.07 (83%), DLCO 76%  Chest Imaging:  CT angio chest 07/16/15 >> Lt lung ATX  Sleep Tests:  PSG 06/03/16 >> AHI 13  Cardiac Tests:  Echo 07/07/20 >> EF 65 to 70%, mild LVH, grade 2 DD, RVSP 35.9 mmHg RHC/LHC 09/22/20 >> elevated LVEDP and PCWP, CI 4.08, PVR 0.6 to 1.22 wood units, stable CAD  Social History:  She  reports that she has never smoked. She has never used smokeless tobacco. She reports that she does not drink alcohol and does not use drugs.  Family History:  Her family history includes Cancer in her father; Heart disease in her mother; Hypertension in her mother; Stroke in her father and mother.     Assessment/Plan:   Snoring with excessive daytime sleepiness. - she is willing to consider therapy again - will arrange for a home sleep study  Allergic asthma. - continue breztri two puffs  bid, singulair 10 mg nightly - prn albuterol - she has a nebulizer - reviewed different roles for her inhalers  Allergic rhinitis. - continue flonase, zyrtec, singulair  Coronary artery disease, Chronic diastolic CHF. - followed by Dr. Patrick Jupiter with cardiology   Time Spent Involved in Patient Care on Day of Examination:  37 minutes  Follow up:   Patient Instructions  Will schedule a home sleep study  Follow up in 6 months  Medication List:   Allergies as of 10/05/2022        Reactions   Aspartame Rash   Penicillins Itching, Rash   Did it involve swelling of the face/tongue/throat, SOB, or low BP? No Did it involve sudden or severe rash/hives, skin peeling, or any reaction on the inside of your mouth or nose? No Did you need to seek medical attention at a hospital or doctor's office? No When did it last happen?~25 years ago       If all above answers are "NO", may proceed with cephalosporin use.   Farxiga [dapagliflozin]    UTIs   Glucose    Metformin    Other Reaction(s): Dizziness GI distress, dizziness   Metformin And Related    GI distress, dizziness   No [facility] Food Allergies Other (See Comments)   Honey-Rash Artificial Sweeteners-rash Dextrose "measles like symptoms" Beet sugar "measles like symptoms"   Paxil [paroxetine Hcl] Other (See Comments)   Twitching/feels like skin crawling   Prozac [fluoxetine Hcl] Other (See Comments)   Twitching/feels like skin crawling   Zoloft [sertraline Hcl] Other (See Comments)   Twitching/feels like skin crawling   Prednisone Palpitations   Ranexa [ranolazine] Palpitations   Heart racing        Medication List        Accurate as of October 05, 2022  3:02 PM. If you have any questions, ask your nurse or doctor.          STOP taking these medications    budesonide 0.25 MG/2ML nebulizer solution Commonly known as: Pulmicort Stopped by: Coralyn Helling   cetirizine 10 MG tablet Commonly known as: ZyrTEC Allergy Stopped by: Coralyn Helling   meclizine 25 MG tablet Commonly known as: ANTIVERT Stopped by: Coralyn Helling       TAKE these medications    Accu-Chek Aviva Soln 2 Bottles by Other route as needed.   albuterol 108 (90 Base) MCG/ACT inhaler Commonly known as: VENTOLIN HFA TAKE 2 PUFFS BY MOUTH EVERY 4 HOURS AS NEEDED FOR WHEEZE   aspirin EC 81 MG tablet Take 81 mg by mouth in the morning.   atorvastatin 40 MG tablet Commonly known as: LIPITOR Take 1 tablet (40 mg total) by  mouth at bedtime.   B-D SINGLE USE SWABS REGULAR Pads Apply topically.   Breztri Aerosphere 160-9-4.8 MCG/ACT Aero Generic drug: Budeson-Glycopyrrol-Formoterol Inhale 2 puffs into the lungs 2 (two) times daily.   CALCIUM 600+D3 PO Take 1 tablet by mouth in the morning.   diazepam 2 MG tablet Commonly known as: Valium Take 1 tablet (2 mg total) by mouth daily as needed (vertigo that is not relieved by meclizine).   diphenhydrAMINE 25 MG tablet Commonly known as: BENADRYL Take 50 mg by mouth daily as needed for allergies.   fluticasone 50 MCG/ACT nasal spray Commonly known as: FLONASE SPRAY 2 SPRAYS INTO EACH NOSTRIL EVERY DAY   glucose blood test strip TEST BLOOD SUGAR TWICE DAILY   ibuprofen 200 MG tablet Commonly known as: ADVIL Take 400-600 mg  by mouth daily as needed (pain (leg pain)).   Insulin Syringe-Needle U-100 31G X 5/16" 0.5 ML Misc Use to inject Insulin four times daily: Use as directed; Dx: E11.9, E66.9   ipratropium-albuterol 0.5-2.5 (3) MG/3ML Soln Commonly known as: DUONEB INHALE CONTENTS OF 1 VIAL VIA NEBULIZER EVERY 6 HOURS AS NEEDED FOR WHEEZING   lansoprazole 15 MG capsule Commonly known as: PREVACID Take 30 mg by mouth at bedtime.   lisinopril 20 MG tablet Commonly known as: ZESTRIL TAKE 1 TABLET BY MOUTH EVERY DAY   metoprolol tartrate 25 MG tablet Commonly known as: LOPRESSOR TAKE 1 TABLET BY MOUTH TWICE A DAY   montelukast 10 MG tablet Commonly known as: SINGULAIR TAKE 1 TABLET BY MOUTH EVERYDAY AT BEDTIME   nitroGLYCERIN 0.4 MG SL tablet Commonly known as: NITROSTAT Place 1 tablet (0.4 mg total) under the tongue every 5 (five) minutes x 3 doses as needed for chest pain (if no relief after 2nd dose, proceed to ED or call 911).   pioglitazone 15 MG tablet Commonly known as: ACTOS Take 1 tablet (15 mg total) by mouth daily. Note dose change   Tresiba FlexTouch 200 UNIT/ML FlexTouch Pen Generic drug: insulin degludec Inject 40 Units  into the skin daily.   triamterene-hydrochlorothiazide 37.5-25 MG capsule Commonly known as: DYAZIDE Take 1 each (1 capsule total) by mouth daily.   Vitamin D (Ergocalciferol) 1.25 MG (50000 UNIT) Caps capsule Commonly known as: DRISDOL Take 50,000 Units by mouth once a week.        Signature:  Coralyn Helling, MD [facility] Pager - [phone] 10/05/2022, 3:02 PM

## 2022-10-05 NOTE — Patient Instructions (Signed)
Will schedule a home sleep study  Follow up in 6 months

## 2022-10-06 DIAGNOSIS — R42 Dizziness and giddiness: Secondary | ICD-10-CM | POA: Diagnosis not present

## 2022-10-06 DIAGNOSIS — R2689 Other abnormalities of gait and mobility: Secondary | ICD-10-CM | POA: Diagnosis not present

## 2022-10-06 DIAGNOSIS — Z9181 History of falling: Secondary | ICD-10-CM | POA: Diagnosis not present

## 2022-10-06 DIAGNOSIS — Z7409 Other reduced mobility: Secondary | ICD-10-CM | POA: Diagnosis not present

## 2022-10-06 DIAGNOSIS — R29898 Other symptoms and signs involving the musculoskeletal system: Secondary | ICD-10-CM | POA: Diagnosis not present

## 2022-10-06 DIAGNOSIS — R2681 Unsteadiness on feet: Secondary | ICD-10-CM | POA: Diagnosis not present

## 2022-10-07 ENCOUNTER — Ambulatory Visit (HOSPITAL_COMMUNITY)
Admission: RE | Admit: 2022-10-07 | Discharge: 2022-10-07 | Disposition: A | Discharge status: Home / Self Care | Payer: Medicare HMO | Source: Ambulatory Visit | Attending: Nephrology | Admitting: Nephrology

## 2022-10-07 DIAGNOSIS — E1122 Type 2 diabetes mellitus with diabetic chronic kidney disease: Secondary | ICD-10-CM | POA: Insufficient documentation

## 2022-10-07 DIAGNOSIS — N189 Chronic kidney disease, unspecified: Secondary | ICD-10-CM | POA: Diagnosis not present

## 2022-10-07 DIAGNOSIS — I5032 Chronic diastolic (congestive) heart failure: Secondary | ICD-10-CM | POA: Insufficient documentation

## 2022-10-07 DIAGNOSIS — N281 Cyst of kidney, acquired: Secondary | ICD-10-CM | POA: Diagnosis not present

## 2022-10-07 DIAGNOSIS — I129 Hypertensive chronic kidney disease with stage 1 through stage 4 chronic kidney disease, or unspecified chronic kidney disease: Secondary | ICD-10-CM | POA: Insufficient documentation

## 2022-10-07 DIAGNOSIS — E782 Mixed hyperlipidemia: Secondary | ICD-10-CM | POA: Insufficient documentation

## 2022-10-11 DIAGNOSIS — R2681 Unsteadiness on feet: Secondary | ICD-10-CM | POA: Diagnosis not present

## 2022-10-11 DIAGNOSIS — Z9181 History of falling: Secondary | ICD-10-CM | POA: Diagnosis not present

## 2022-10-11 DIAGNOSIS — Z7409 Other reduced mobility: Secondary | ICD-10-CM | POA: Diagnosis not present

## 2022-10-11 DIAGNOSIS — R42 Dizziness and giddiness: Secondary | ICD-10-CM | POA: Diagnosis not present

## 2022-10-11 DIAGNOSIS — R2689 Other abnormalities of gait and mobility: Secondary | ICD-10-CM | POA: Diagnosis not present

## 2022-10-11 DIAGNOSIS — R29898 Other symptoms and signs involving the musculoskeletal system: Secondary | ICD-10-CM | POA: Diagnosis not present

## 2022-10-13 DIAGNOSIS — R42 Dizziness and giddiness: Secondary | ICD-10-CM | POA: Diagnosis not present

## 2022-10-13 DIAGNOSIS — R2681 Unsteadiness on feet: Secondary | ICD-10-CM | POA: Diagnosis not present

## 2022-10-13 DIAGNOSIS — Z9181 History of falling: Secondary | ICD-10-CM | POA: Diagnosis not present

## 2022-10-13 DIAGNOSIS — Z7409 Other reduced mobility: Secondary | ICD-10-CM | POA: Diagnosis not present

## 2022-10-13 DIAGNOSIS — R29898 Other symptoms and signs involving the musculoskeletal system: Secondary | ICD-10-CM | POA: Diagnosis not present

## 2022-10-13 DIAGNOSIS — R2689 Other abnormalities of gait and mobility: Secondary | ICD-10-CM | POA: Diagnosis not present

## 2022-10-15 DIAGNOSIS — G4730 Sleep apnea, unspecified: Secondary | ICD-10-CM | POA: Diagnosis not present

## 2022-10-18 DIAGNOSIS — R2689 Other abnormalities of gait and mobility: Secondary | ICD-10-CM | POA: Diagnosis not present

## 2022-10-18 DIAGNOSIS — R29898 Other symptoms and signs involving the musculoskeletal system: Secondary | ICD-10-CM | POA: Diagnosis not present

## 2022-10-18 DIAGNOSIS — R2681 Unsteadiness on feet: Secondary | ICD-10-CM | POA: Diagnosis not present

## 2022-10-18 DIAGNOSIS — R42 Dizziness and giddiness: Secondary | ICD-10-CM | POA: Diagnosis not present

## 2022-10-18 DIAGNOSIS — Z9181 History of falling: Secondary | ICD-10-CM | POA: Diagnosis not present

## 2022-10-18 DIAGNOSIS — Z7409 Other reduced mobility: Secondary | ICD-10-CM | POA: Diagnosis not present

## 2022-10-20 DIAGNOSIS — R42 Dizziness and giddiness: Secondary | ICD-10-CM | POA: Diagnosis not present

## 2022-10-20 DIAGNOSIS — R2689 Other abnormalities of gait and mobility: Secondary | ICD-10-CM | POA: Diagnosis not present

## 2022-10-20 DIAGNOSIS — Z9181 History of falling: Secondary | ICD-10-CM | POA: Diagnosis not present

## 2022-10-20 DIAGNOSIS — R29898 Other symptoms and signs involving the musculoskeletal system: Secondary | ICD-10-CM | POA: Diagnosis not present

## 2022-10-20 DIAGNOSIS — Z7409 Other reduced mobility: Secondary | ICD-10-CM | POA: Diagnosis not present

## 2022-10-20 DIAGNOSIS — R2681 Unsteadiness on feet: Secondary | ICD-10-CM | POA: Diagnosis not present

## 2022-10-25 ENCOUNTER — Telehealth: Payer: Self-pay

## 2022-10-25 NOTE — Telephone Encounter (Signed)
Emailed Evaristo Bury refills to [facility]

## 2022-10-27 DIAGNOSIS — Z9181 History of falling: Secondary | ICD-10-CM | POA: Diagnosis not present

## 2022-10-27 DIAGNOSIS — R42 Dizziness and giddiness: Secondary | ICD-10-CM | POA: Diagnosis not present

## 2022-10-27 DIAGNOSIS — Z7409 Other reduced mobility: Secondary | ICD-10-CM | POA: Diagnosis not present

## 2022-10-27 DIAGNOSIS — R2681 Unsteadiness on feet: Secondary | ICD-10-CM | POA: Diagnosis not present

## 2022-10-27 DIAGNOSIS — R2689 Other abnormalities of gait and mobility: Secondary | ICD-10-CM | POA: Diagnosis not present

## 2022-10-27 DIAGNOSIS — R29898 Other symptoms and signs involving the musculoskeletal system: Secondary | ICD-10-CM | POA: Diagnosis not present

## 2022-10-28 ENCOUNTER — Encounter: Payer: Self-pay | Admitting: Pharmacist

## 2022-10-28 ENCOUNTER — Ambulatory Visit (INDEPENDENT_AMBULATORY_CARE_PROVIDER_SITE_OTHER): Payer: Medicare HMO

## 2022-10-28 ENCOUNTER — Telehealth: Payer: Self-pay | Admitting: Pulmonary Disease

## 2022-10-28 DIAGNOSIS — G4733 Obstructive sleep apnea (adult) (pediatric): Secondary | ICD-10-CM | POA: Diagnosis not present

## 2022-10-28 DIAGNOSIS — R0683 Snoring: Secondary | ICD-10-CM

## 2022-10-28 NOTE — Telephone Encounter (Signed)
HST 10/15/22 >> AHI 5.6, SpO2 low 84%   Please inform her that her sleep study shows mild obstructive sleep apnea.  Please arrange for ROV with a sleep physician of a NP to discuss treatment options.

## 2022-10-31 DIAGNOSIS — N1832 Chronic kidney disease, stage 3b: Secondary | ICD-10-CM | POA: Diagnosis not present

## 2022-10-31 DIAGNOSIS — I5032 Chronic diastolic (congestive) heart failure: Secondary | ICD-10-CM | POA: Diagnosis not present

## 2022-10-31 DIAGNOSIS — E559 Vitamin D deficiency, unspecified: Secondary | ICD-10-CM | POA: Diagnosis not present

## 2022-10-31 DIAGNOSIS — N2581 Secondary hyperparathyroidism of renal origin: Secondary | ICD-10-CM | POA: Diagnosis not present

## 2022-10-31 DIAGNOSIS — E1122 Type 2 diabetes mellitus with diabetic chronic kidney disease: Secondary | ICD-10-CM | POA: Diagnosis not present

## 2022-10-31 DIAGNOSIS — N189 Chronic kidney disease, unspecified: Secondary | ICD-10-CM | POA: Diagnosis not present

## 2022-10-31 NOTE — Telephone Encounter (Signed)
Spoke with the pt and notified of results/recs  Appt with Amber Reeves was schedule for 11/01/22  Nothing further needed

## 2022-11-01 ENCOUNTER — Ambulatory Visit: Payer: Medicare HMO | Admitting: Primary Care

## 2022-11-01 ENCOUNTER — Encounter: Payer: Self-pay | Admitting: Primary Care

## 2022-11-01 VITALS — BP 128/68 | HR 56 | Ht 63.0 in | Wt 246.6 lb

## 2022-11-01 DIAGNOSIS — G4730 Sleep apnea, unspecified: Secondary | ICD-10-CM

## 2022-11-01 DIAGNOSIS — J4540 Moderate persistent asthma, uncomplicated: Secondary | ICD-10-CM

## 2022-11-01 NOTE — Assessment & Plan Note (Signed)
-   Hx sleep apnea, intolerant to CPAP. Repeat home sleep testing showed very mild OSA, AHI 5.6/hour with SpO2 low 84%. Patient spent 6 minutes with SpO2 <88%.  Reviewed sleep study results patient patient, she does not to be restarted on PAP therapy. She is not a candidate for oral appliance and she already follows with ENT. Recommend patient continue to work on weight loss and elevate head of bed while sleeping.

## 2022-11-01 NOTE — Progress Notes (Signed)
@Patient  ID: Amber Reeves, female    DOB: 06/17/1950, 72 y.o.   MRN: [id number]  Chief Complaint  Patient presents with   Follow-up    Hst results     Referring provider: Raliegh Ip, DO  HPI: 72 year old female, never smoked. PMH significant for coronary artery disease, type 2 diabetes, hypertension, severe persistent asthma, OSA, hyperlipidemia.   Previous [facility] pulmonary encounter: 10/05/22- Dr. Craige Cotta  She is here with her husband.  She developed a sinus infection.  This caused hearing loss.  She saw ENT.  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  She had this treated. She then developed vertigo.  Has been working with PM&R for vestibular exercises.  Her nephrologist is worried her renal function is getting worse.  She snores and has trouble sleeping at night.  Wants to get sleep study set up.  Has been able to use breztri.  This helps.  Using singulair at night.  Hasn't needed albuterol much.   11/01/2022- Interim hx  Patient presents today to follow-up on home sleep study. Follows with Dr. Craige Cotta for history of severe persistent asthma and OSA. Last seen on 10/05/22.   Sleeps ok at night. She does snore but does not wake herself up gasping/choking She sleeps in separate room from her husband  Sleeps with the head of bed elevated She has gained weight over the last couple of years  HST 10/15/22 showed mild OSA, AHI 5.6 with Spo2 low 84% She spent 6 mins with O2 level <88%  Breathing seems to be ok right now. Not acutely exacerbated. Gets tired and winded with exertion She has trouble with allergies. Takes singular and benadryl at night  Sees ENT for vertigo symptoms  Using Breztri twie a day, using albuterol <1 day   Allergies  Allergen Reactions   Aspartame Rash   Penicillins Itching and Rash    Did it involve swelling of the face/tongue/throat, SOB, or low BP? No Did it involve sudden or severe rash/hives, skin peeling, or any reaction on  the inside of your mouth or nose? No Did you need to seek medical attention at a hospital or doctor's office? No When did it last happen?~25 years ago       If all above answers are "NO", may proceed with cephalosporin use.    Farxiga [Dapagliflozin]     UTIs   Glucose    Metformin     Other Reaction(s): Dizziness  GI distress, dizziness   Metformin And Related     GI distress, dizziness   No [facility] Food Allergies Other (See Comments)    Honey-Rash Artificial Sweeteners-rash Dextrose "measles like symptoms" Beet sugar "measles like symptoms"   Paxil [Paroxetine Hcl] Other (See Comments)    Twitching/feels like skin crawling   Prozac [Fluoxetine Hcl] Other (See Comments)    Twitching/feels like skin crawling   Zoloft [Sertraline Hcl] Other (See Comments)    Twitching/feels like skin crawling   Prednisone Palpitations   Ranexa [Ranolazine] Palpitations    Heart racing    Immunization History  Administered Date(s) Administered   Influenza, High Dose Seasonal PF 02/16/2017   Influenza,inj,Quad PF,6+ Mos 12/18/2013   Moderna Sars-Covid-2 Vaccination 05/01/2019, 05/29/2019, 02/18/2020    Past Medical History:  Diagnosis Date   Allergy    Anxiety    Arthritis    Asthma    Coronary artery disease    Depression    Diabetes mellitus without complication (HCC)  Dyspnea    GERD (gastroesophageal reflux disease)    Hyperlipidemia    Hypertension    Pneumonia     Tobacco History: Social History   Tobacco Use  Smoking Status Never  Smokeless Tobacco Never   Counseling given: Not Answered   Outpatient Medications Prior to Visit  Medication Sig Dispense Refill   albuterol (VENTOLIN HFA) 108 (90 Base) MCG/ACT inhaler TAKE 2 PUFFS BY MOUTH EVERY 4 HOURS AS NEEDED FOR WHEEZE 18 g 3   Alcohol Swabs (B-D SINGLE USE SWABS REGULAR) PADS Apply topically.     aspirin EC 81 MG tablet Take 81 mg by mouth in the morning.     atorvastatin (LIPITOR) 40 MG tablet Take 1  tablet (40 mg total) by mouth at bedtime. 90 tablet 3   Blood Glucose Calibration (ACCU-CHEK AVIVA) SOLN 2 Bottles by Other route as needed.     Budeson-Glycopyrrol-Formoterol (BREZTRI AEROSPHERE) 160-9-4.8 MCG/ACT AERO Inhale 2 puffs into the lungs 2 (two) times daily. 32.1 g 11   Calcium Carb-Cholecalciferol (CALCIUM 600+D3 PO) Take 1 tablet by mouth in the morning.     diazepam (VALIUM) 2 MG tablet Take 1 tablet (2 mg total) by mouth daily as needed (vertigo that is not relieved by meclizine). 30 tablet 0   diphenhydrAMINE (BENADRYL) 25 MG tablet Take 50 mg by mouth daily as needed for allergies.     fluticasone (FLONASE) 50 MCG/ACT nasal spray SPRAY 2 SPRAYS INTO EACH NOSTRIL EVERY DAY 48 mL 2   glucose blood test strip TEST BLOOD SUGAR TWICE DAILY     ibuprofen (ADVIL) 200 MG tablet Take 400-600 mg by mouth daily as needed (pain (leg pain)).     insulin degludec (TRESIBA FLEXTOUCH) 200 UNIT/ML FlexTouch Pen Inject 40 Units into the skin daily. 18 mL 0   Insulin Syringe-Needle U-100 31G X 5/16" 0.5 ML MISC Use to inject Insulin four times daily: Use as directed; Dx: E11.9, E66.9     ipratropium-albuterol (DUONEB) 0.5-2.5 (3) MG/3ML SOLN INHALE CONTENTS OF 1 VIAL VIA NEBULIZER EVERY 6 HOURS AS NEEDED FOR WHEEZING 360 mL 0   lansoprazole (PREVACID) 15 MG capsule Take 30 mg by mouth at bedtime.     lisinopril (ZESTRIL) 20 MG tablet TAKE 1 TABLET BY MOUTH EVERY DAY 90 tablet 2   metoprolol tartrate (LOPRESSOR) 25 MG tablet TAKE 1 TABLET BY MOUTH TWICE A DAY 180 tablet 1   montelukast (SINGULAIR) 10 MG tablet TAKE 1 TABLET BY MOUTH EVERYDAY AT BEDTIME 90 tablet 3   nitroGLYCERIN (NITROSTAT) 0.4 MG SL tablet Place 1 tablet (0.4 mg total) under the tongue every 5 (five) minutes x 3 doses as needed for chest pain (if no relief after 2nd dose, proceed to ED or call 911). 25 tablet 3   pioglitazone (ACTOS) 15 MG tablet Take 1 tablet (15 mg total) by mouth daily. Note dose change 90 tablet 3    triamterene-hydrochlorothiazide (DYAZIDE) 37.5-25 MG capsule Take 1 each (1 capsule total) by mouth daily. 90 capsule 3   Vitamin D, Ergocalciferol, (DRISDOL) 1.25 MG (50000 UNIT) CAPS capsule Take 50,000 Units by mouth once a week.     No facility-administered medications prior to visit.      Review of Systems  Review of Systems  Constitutional: Negative.   HENT:  Positive for postnasal drip.   Respiratory: Negative.  Negative for shortness of breath and wheezing.    Physical Exam  BP 128/68   Pulse (!) 56   Ht 5\' 3"  (1.6  m)   Wt 246 lb 9.6 oz (111.9 kg)   SpO2 98%   BMI 43.68 kg/m  Physical Exam Constitutional:      Appearance: Normal appearance.  HENT:     Head: Normocephalic and atraumatic.     Mouth/Throat:     Mouth: Mucous membranes are moist.     Pharynx: Oropharynx is clear.  Cardiovascular:     Rate and Rhythm: Normal rate and regular rhythm.  Pulmonary:     Effort: Pulmonary effort is normal.     Breath sounds: Normal breath sounds. No wheezing, rhonchi or rales.  Musculoskeletal:        General: Normal range of motion.  Skin:    General: Skin is warm and dry.  Neurological:     General: No focal deficit present.     Mental Status: She is alert and oriented to person, place, and time. Mental status is at baseline.  Psychiatric:        Mood and Affect: Mood normal.        Behavior: Behavior normal.        Thought Content: Thought content normal.        Judgment: Judgment normal.      Lab Results:  CBC    Component Value Date/Time   WBC 5.9 05/13/2022 0956   RBC 3.32 (L) 05/13/2022 0956   HGB 10.6 (L) 05/13/2022 0956   HGB 11.8 07/21/2021 1341   HCT 34.3 (L) 05/13/2022 0956   HCT 35.6 07/21/2021 1341   PLT 164 05/13/2022 0956   PLT 195 07/21/2021 1341   MCV 103.3 (H) 05/13/2022 0956   MCV 97 07/21/2021 1341   MCH 31.9 05/13/2022 0956   MCHC 30.9 05/13/2022 0956   RDW 13.3 05/13/2022 0956   RDW 13.1 07/21/2021 1341   LYMPHSABS 1.3  05/13/2022 0956   LYMPHSABS 1.6 06/14/2021 1142   MONOABS 0.6 05/13/2022 0956   EOSABS 0.2 05/13/2022 0956   EOSABS 0.3 06/14/2021 1142   BASOSABS 0.1 05/13/2022 0956   BASOSABS 0.1 06/14/2021 1142    BMET    Component Value Date/Time   NA 135 06/29/2022 1350   K 5.0 06/29/2022 1350   CL 99 06/29/2022 1350   CO2 20 06/29/2022 1350   GLUCOSE 83 06/29/2022 1350   GLUCOSE 69 (L) 05/13/2022 0956   BUN 33 (H) 06/29/2022 1350   CREATININE 1.45 (H) 06/29/2022 1350   CALCIUM 9.0 06/29/2022 1350   GFRNONAA 39 (L) 05/13/2022 0956   GFRAA 72 02/11/2020 1512    BNP    Component Value Date/Time   BNP 95.5 07/21/2021 1341    ProBNP No results found for: "PROBNP"  Imaging: US RENAL  Result Date: 10/07/2022 CLINICAL DATA:  Chronic kidney disease. EXAM: RENAL / URINARY TRACT ULTRASOUND COMPLETE COMPARISON:  None Available. FINDINGS: Right Kidney: Renal measurements: 12.4 x 4.35 cm = volume: 138.3 mL. Echogenicity within normal limits. No hydronephrosis visualized. 0.7 x 0.7 x 0.8 cm simple cyst is noted in the lower pole right kidney. No follow-up is recommended. Left Kidney: Renal measurements: 12.4 x 5.4 x 4.1 cm = volume: 144.5 mL. Echogenicity within normal limits. No hydronephrosis visualized. 2.7 x 2.7 x 2.2 cm simple cyst is identified in the upper pole left kidney. No follow-up is recommended. Bladder: Appears normal for degree of bladder distention. Prevoid volume of 155.4, postvoid volume of 12.3. Other: None. IMPRESSION: 1. No acute abnormality identified. No hydronephrosis bilaterally. Electronically Signed   By: Gabriel Carina.D.  On: 10/07/2022 15:58     Assessment & Plan:   Moderate persistent asthma without complication - Stable; No acute respiratory symptoms. No change to current therapy  - Continue Breztri Aerosphere two puffs twice daily, montelukast 10mg  at bedtime and prn albuterol hfa - FU in 6 months or sooner if needed   Mild sleep apnea - Hx sleep apnea,  intolerant to CPAP. Repeat home sleep testing showed very mild OSA, AHI 5.6/hour with SpO2 low 84%. Patient spent 6 minutes with SpO2 <88%.  Reviewed sleep study results patient patient, she does not to be restarted on PAP therapy. She is not a candidate for oral appliance and she already follows with ENT. Recommend patient continue to work on weight loss and elevate head of bed while sleeping.      Glenford Bayley, NP 11/01/2022

## 2022-11-01 NOTE — Assessment & Plan Note (Signed)
-   Stable; No acute respiratory symptoms. No change to current therapy  - Continue Breztri Aerosphere two puffs twice daily, montelukast 10mg  at bedtime and prn albuterol hfa - FU in 6 months or sooner if needed

## 2022-11-01 NOTE — Progress Notes (Signed)
Reviewed and agree with assessment/plan.   Coralyn Helling, MD [facility] 11/01/2022, 5:51 PM Pager:  [phone]

## 2022-11-01 NOTE — Telephone Encounter (Signed)
Faxed refills/dose increase to [facility].

## 2022-11-01 NOTE — Patient Instructions (Addendum)
Showed very minimal obstructive sleep apnea, you do not need to be restarted on CPAP Continue to work on weight loss Continue to sleep with head of bed elevated Continue Breztri 2 puffs twice daily and Singulair 10 mg at bedtime  Follow-up 6 months with [facility] NP/ asthma

## 2022-11-03 DIAGNOSIS — Z7409 Other reduced mobility: Secondary | ICD-10-CM | POA: Diagnosis not present

## 2022-11-03 DIAGNOSIS — R2689 Other abnormalities of gait and mobility: Secondary | ICD-10-CM | POA: Diagnosis not present

## 2022-11-03 DIAGNOSIS — R2681 Unsteadiness on feet: Secondary | ICD-10-CM | POA: Diagnosis not present

## 2022-11-03 DIAGNOSIS — R29898 Other symptoms and signs involving the musculoskeletal system: Secondary | ICD-10-CM | POA: Diagnosis not present

## 2022-11-03 DIAGNOSIS — Z9181 History of falling: Secondary | ICD-10-CM | POA: Diagnosis not present

## 2022-11-03 DIAGNOSIS — R42 Dizziness and giddiness: Secondary | ICD-10-CM | POA: Diagnosis not present

## 2022-11-11 ENCOUNTER — Encounter: Payer: Self-pay | Admitting: Pharmacist

## 2022-11-11 DIAGNOSIS — N281 Cyst of kidney, acquired: Secondary | ICD-10-CM | POA: Diagnosis not present

## 2022-11-11 DIAGNOSIS — G4733 Obstructive sleep apnea (adult) (pediatric): Secondary | ICD-10-CM | POA: Diagnosis not present

## 2022-11-11 DIAGNOSIS — E1122 Type 2 diabetes mellitus with diabetic chronic kidney disease: Secondary | ICD-10-CM | POA: Diagnosis not present

## 2022-11-11 DIAGNOSIS — I129 Hypertensive chronic kidney disease with stage 1 through stage 4 chronic kidney disease, or unspecified chronic kidney disease: Secondary | ICD-10-CM | POA: Diagnosis not present

## 2022-12-09 ENCOUNTER — Telehealth: Payer: Self-pay | Admitting: Family Medicine

## 2022-12-12 NOTE — Patient Instructions (Addendum)
Our records indicate that you are due for your screening mammogram. Your primary care provider has placed an order  through the [facility] that comes to our location. Please ask about scheduling an appointment when checking out or call our office if you would like to make an appointment.   It appears that you have a viral upper respiratory infection (cold).  Cold symptoms can last up to 2 weeks.    - Get plenty of rest and drink plenty of fluids. - Try to breathe moist air. Use a cold mist humidifier. - Consume warm fluids (soup or tea) to provide relief for a stuffy nose and to loosen phlegm. - For nasal stuffiness, try saline nasal spray or a Neti Pot. Afrin nasal spray can also be used but this product should not be used longer than 3 days or it will cause rebound nasal stuffiness (worsening nasal congestion). - For sore throat pain relief: use chloraseptic spray, suck on throat lozenges, hard candy or popsicles; gargle with warm salt water (1/4 tsp. salt per 8 oz. of water); and eat soft, bland foods. - Eat a well-balanced diet. If you cannot, ensure you are getting enough nutrients by taking a daily multivitamin. - Avoid dairy products, as they can thicken phlegm. - Avoid alcohol, as it impairs your body's immune system.  CONTACT YOUR DOCTOR IF YOU EXPERIENCE ANY OF THE FOLLOWING: - High fever - Ear pain - Sinus-type headache - Unusually severe cold symptoms - Cough that gets worse while other cold symptoms improve - Flare up of any chronic lung problem, such as asthma - Your symptoms persist longer than 2 weeks

## 2022-12-12 NOTE — Telephone Encounter (Signed)
Patient had trouble renewing [employer] app online. Pt ok with me completing renewal on her behalf.  Pt also aware novo nordisk renewal will be submitted online.   Informed pt her insulin is ready for pickup at office.

## 2022-12-14 ENCOUNTER — Ambulatory Visit: Payer: Medicare HMO | Attending: Cardiology | Admitting: Cardiology

## 2022-12-14 ENCOUNTER — Telehealth: Payer: Self-pay

## 2022-12-14 ENCOUNTER — Encounter: Payer: Self-pay | Admitting: Cardiology

## 2022-12-14 DIAGNOSIS — J4540 Moderate persistent asthma, uncomplicated: Secondary | ICD-10-CM

## 2022-12-14 NOTE — Telephone Encounter (Signed)
Submitted application for BREZTRI to [employer] for patient assistance.   Phone: [phone]

## 2022-12-14 NOTE — Telephone Encounter (Signed)
Submitted re-enrollment application for [facility] to [employer] [facility] for patient assistance via online portal.   Phone: [phone]

## 2022-12-14 NOTE — Progress Notes (Deleted)
Clinical Summary Amber Reeves is a 72 y.o.female  seen today for follow up of the following medical problems.    1. CAD -11/2017 cath at [facility] showed >70% mid LAD, otherwise patent vessels. She presented with chest pain, not an MI - received DES to LAD - 11/2017 echo  LVEF Complete echocardiogram shows a left ventricular EF by visual approximation 60 to 65%. Normal left ventricular systolic function. Grade 1 left ventricular diastolic function. No significant valvular disease. No pericardial effusion   01/2018 stress test: THR not achieved, exercise limiting angina , [facility] treadmill score neg 1   - she reports prior false negative stress tests in the past.  - 09/04/18 nuclear stress by prior cardiollogist showed anterior breast artifact, probably normal,     Jan 2021 cath: mild nonobstructive disease.    -06/2020 echo LVEF 65-70%, grade II dd, - lasix changed to 20mg  daily   09/2020 RHC/LHC: LM patent, patent LAD stent, ramus patent, LCX patent, RCA 20%. Several tiny diagonals are jailed by stent, to small for intervetion Mean PA 21 PCWP 16 CI 4.08 LVEDP 18     - recent chest pains. Left sided pain. Sharp/stabbing, 10/10 in severity. Often awakes her from sleep. Not positional. Lasts 10-15 minutes. Can improve with NG. -sedentary lifestyle due to chronic dizziness, SOB       2. HTN - renal artery angio at time of 11/2017 cath showed no significant renal artery disease  - 120s-140s/50-55   3. Hyperlipidemia - compliant with emds   4. OSA on cpap - she attributes getting pneumonia when on cpap, she stopped wearing. - does not want to wear cpap       5. Chronic diastolic HF -some LE edema at times.  - compliant with meds   6. Dizziness - ER visit 07/2021  - from EMS notes bps 80s to 90s with standing.  - improved with IVFs   - dizziness can happen with laying flat, sitting or standing.  - recent inner ear issues, followed by ENT - ongoing times of room  spinning extended periods when lying flat on her back   8.Cerebral aneurysm - followed by neurosurg - 11/2021 stent/ciling procedure right ACA aneursym     9. Arrhythmia - 08/2021 monitor one 4 beat run NSVT, 31 runs SVT longest 15 seconds.  -had been on prednisone around this time.  Past Medical History:  Diagnosis Date   Allergy    Anxiety    Arthritis    Asthma    Coronary artery disease    Depression    Diabetes mellitus without complication (HCC)    Dyspnea    GERD (gastroesophageal reflux disease)    Hyperlipidemia    Hypertension    Pneumonia      Allergies  Allergen Reactions   Aspartame Rash   Penicillins Itching and Rash    Did it involve swelling of the face/tongue/throat, SOB, or low BP? No Did it involve sudden or severe rash/hives, skin peeling, or any reaction on the inside of your mouth or nose? No Did you need to seek medical attention at a hospital or doctor's office? No When did it last happen?~25 years ago       If all above answers are "NO", may proceed with cephalosporin use.    Marcelline Deist [Dapagliflozin]     UTIs   Glucose    Metformin     Other Reaction(s): Dizziness  GI distress, dizziness   Metformin And Related  GI distress, dizziness   No [facility] Food Allergies Other (See Comments)    Honey-Rash Artificial Sweeteners-rash Dextrose "measles like symptoms" Beet sugar "measles like symptoms"   Paxil [Paroxetine Hcl] Other (See Comments)    Twitching/feels like skin crawling   Prozac [Fluoxetine Hcl] Other (See Comments)    Twitching/feels like skin crawling   Zoloft [Sertraline Hcl] Other (See Comments)    Twitching/feels like skin crawling   Prednisone Palpitations   Ranexa [Ranolazine] Palpitations    Heart racing     Current Outpatient Medications  Medication Sig Dispense Refill   albuterol (VENTOLIN HFA) 108 (90 Base) MCG/ACT inhaler TAKE 2 PUFFS BY MOUTH EVERY 4 HOURS AS NEEDED FOR WHEEZE 18 g 3   Alcohol Swabs (B-D  SINGLE USE SWABS REGULAR) PADS Apply topically.     aspirin EC 81 MG tablet Take 81 mg by mouth in the morning.     atorvastatin (LIPITOR) 40 MG tablet Take 1 tablet (40 mg total) by mouth at bedtime. 90 tablet 3   Blood Glucose Calibration (ACCU-CHEK AVIVA) SOLN 2 Bottles by Other route as needed.     Budeson-Glycopyrrol-Formoterol (BREZTRI AEROSPHERE) 160-9-4.8 MCG/ACT AERO Inhale 2 puffs into the lungs 2 (two) times daily. 32.1 g 11   Calcium Carb-Cholecalciferol (CALCIUM 600+D3 PO) Take 1 tablet by mouth in the morning.     diazepam (VALIUM) 2 MG tablet Take 1 tablet (2 mg total) by mouth daily as needed (vertigo that is not relieved by meclizine). 30 tablet 0   diphenhydrAMINE (BENADRYL) 25 MG tablet Take 50 mg by mouth daily as needed for allergies.     fluticasone (FLONASE) 50 MCG/ACT nasal spray SPRAY 2 SPRAYS INTO EACH NOSTRIL EVERY DAY 48 mL 2   glucose blood test strip TEST BLOOD SUGAR TWICE DAILY     ibuprofen (ADVIL) 200 MG tablet Take 400-600 mg by mouth daily as needed (pain (leg pain)).     insulin degludec (TRESIBA FLEXTOUCH) 200 UNIT/ML FlexTouch Pen Inject 40 Units into the skin daily. 18 mL 0   Insulin Syringe-Needle U-100 31G X 5/16" 0.5 ML MISC Use to inject Insulin four times daily: Use as directed; Dx: E11.9, E66.9     ipratropium-albuterol (DUONEB) 0.5-2.5 (3) MG/3ML SOLN INHALE CONTENTS OF 1 VIAL VIA NEBULIZER EVERY 6 HOURS AS NEEDED FOR WHEEZING 360 mL 0   lansoprazole (PREVACID) 15 MG capsule Take 30 mg by mouth at bedtime.     lisinopril (ZESTRIL) 20 MG tablet TAKE 1 TABLET BY MOUTH EVERY DAY 90 tablet 2   metoprolol tartrate (LOPRESSOR) 25 MG tablet TAKE 1 TABLET BY MOUTH TWICE A DAY 180 tablet 1   montelukast (SINGULAIR) 10 MG tablet TAKE 1 TABLET BY MOUTH EVERYDAY AT BEDTIME 90 tablet 3   nitroGLYCERIN (NITROSTAT) 0.4 MG SL tablet Place 1 tablet (0.4 mg total) under the tongue every 5 (five) minutes x 3 doses as needed for chest pain (if no relief after 2nd dose,  proceed to ED or call 911). 25 tablet 3   pioglitazone (ACTOS) 15 MG tablet Take 1 tablet (15 mg total) by mouth daily. Note dose change 90 tablet 3   triamterene-hydrochlorothiazide (DYAZIDE) 37.5-25 MG capsule Take 1 each (1 capsule total) by mouth daily. 90 capsule 3   Vitamin D, Ergocalciferol, (DRISDOL) 1.25 MG (50000 UNIT) CAPS capsule Take 50,000 Units by mouth once a week.     No current facility-administered medications for this visit.     Past Surgical History:  Procedure Laterality Date  ABDOMINAL HYSTERECTOMY     BREAST EXCISIONAL BIOPSY     CESAREAN SECTION     3   CHOLECYSTECTOMY  2018   CORONARY STENT PLACEMENT  10/2017   [facility] hospital)   IR 3D INDEPENDENT WKST  11/26/2021   IR ANGIO INTRA EXTRACRAN SEL COM CAROTID INNOMINATE UNI R MOD SED  10/21/2021   IR ANGIO INTRA EXTRACRAN SEL COM CAROTID INNOMINATE UNI R MOD SED  05/13/2022   IR ANGIO INTRA EXTRACRAN SEL INTERNAL CAROTID UNI L MOD SED  10/21/2021   IR ANGIO INTRA EXTRACRAN SEL INTERNAL CAROTID UNI R MOD SED  11/26/2021   IR ANGIO VERTEBRAL SEL VERTEBRAL UNI L MOD SED  10/21/2021   IR ANGIOGRAM FOLLOW UP STUDY  11/26/2021   IR ANGIOGRAM FOLLOW UP STUDY  11/26/2021   IR ANGIOGRAM FOLLOW UP STUDY  11/26/2021   IR TRANSCATH/EMBOLIZ  11/26/2021   LEFT HEART CATH AND CORONARY ANGIOGRAPHY N/A 02/25/2019   Procedure: LEFT HEART CATH AND CORONARY ANGIOGRAPHY;  Surgeon: Yvonne Kendall, MD;  Location: [facility] INVASIVE CV LAB;  Service: Cardiovascular;  Laterality: N/A;   RADIOLOGY WITH ANESTHESIA N/A 11/26/2021   Procedure: 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;  Surgeon: Lisbeth Renshaw, MD;  Location: [facility] OR;  Service: Radiology;  Laterality: N/A;   REPLACEMENT TOTAL KNEE BILATERAL Bilateral    done in [place] (2003/2005)   RIGHT/LEFT HEART CATH AND CORONARY ANGIOGRAPHY N/A 09/22/2020   Procedure: RIGHT/LEFT HEART CATH AND CORONARY ANGIOGRAPHY;  Surgeon: Marykay Lex, MD;  Location: [facility] [place]  INVASIVE CV LAB;  Service: Cardiovascular;  Laterality: N/A;   TUBAL LIGATION       Allergies  Allergen Reactions   Aspartame Rash   Penicillins Itching and Rash    Did it involve swelling of the face/tongue/throat, SOB, or low BP? No Did it involve sudden or severe rash/hives, skin peeling, or any reaction on the inside of your mouth or nose? No Did you need to seek medical attention at a hospital or doctor's office? No When did it last happen?~25 years ago       If all above answers are "NO", may proceed with cephalosporin use.    Marcelline Deist [Dapagliflozin]     UTIs   Glucose    Metformin     Other Reaction(s): Dizziness  GI distress, dizziness   Metformin And Related     GI distress, dizziness   No [facility] Food Allergies Other (See Comments)    Honey-Rash Artificial Sweeteners-rash Dextrose "measles like symptoms" Beet sugar "measles like symptoms"   Paxil [Paroxetine Hcl] Other (See Comments)    Twitching/feels like skin crawling   Prozac [Fluoxetine Hcl] Other (See Comments)    Twitching/feels like skin crawling   Zoloft [Sertraline Hcl] Other (See Comments)    Twitching/feels like skin crawling   Prednisone Palpitations   Ranexa [Ranolazine] Palpitations    Heart racing      Family History  Problem Relation Age of Onset   Heart disease Mother    Stroke Mother    Hypertension Mother    Cancer Father    Stroke Father      Social History Ms. Verni reports that she has never smoked. She has never used smokeless tobacco. Ms. Falla reports no history of alcohol use.   Review of Systems CONSTITUTIONAL: No weight loss, fever, chills, weakness or fatigue.  HEENT: Eyes: No visual loss, blurred vision, double vision or yellow sclerae.No hearing loss, sneezing, congestion, runny nose or sore throat.  SKIN:  No rash or itching.  CARDIOVASCULAR:  RESPIRATORY: No shortness of breath, cough or sputum.  GASTROINTESTINAL: No anorexia, nausea, vomiting or diarrhea.  No abdominal pain or blood.  GENITOURINARY: No burning on urination, no polyuria NEUROLOGICAL: No headache, dizziness, syncope, paralysis, ataxia, numbness or tingling in the extremities. No change in bowel or bladder control.  MUSCULOSKELETAL: No muscle, back pain, joint pain or stiffness.  LYMPHATICS: No enlarged nodes. No history of splenectomy.  PSYCHIATRIC: No history of depression or anxiety.  ENDOCRINOLOGIC: No reports of sweating, cold or heat intolerance. No polyuria or polydipsia.  Marland Kitchen   Physical Examination There were no vitals filed for this visit. There were no vitals filed for this visit.  Gen: resting comfortably, no acute distress HEENT: no scleral icterus, pupils equal round and reactive, no palptable cervical adenopathy,  CV Resp: Clear to auscultation bilaterally GI: abdomen is soft, non-tender, non-distended, normal bowel sounds, no hepatosplenomegaly MSK: extremities are warm, no edema.  Skin: warm, no rash Neuro:  no focal deficits Psych: appropriate affect   Diagnostic Studies  11/2017 cath [facility] Severe >70% long mid LAD stenosis No other significant angiographic stenosis in the other coronary arteries  DES to mid LAD   Malignant hypertension - selective renal angio without any evidence of  renal artery stenosis.   Renal angio procedure:  A Tiger 4 catheter was used to selectively engage both the left and right  renal arteries and was used to perform selective angiography.    Renal artery anatomy:  The left kidney is supplied by a single renal artery which has no  angiographic stenosis  The right kidney is supplied by a single rena artery which has no  angiographic stenosis     Jan 2021 Mild, non-obstructive coronary artery disease. Widely patent proximal/mid LAD stent. Normal left ventricular systolic function with upper normal to mildly elevated filling pressure (LVEDP ~15 mmHg).   06/2020 echo  1. Left ventricular ejection  fraction, by estimation, is 65 to 70%. The  left ventricle has normal function. Left ventricular endocardial border  not optimally defined to evaluate regional wall motion. There is mild left  ventricular hypertrophy. Left  ventricular diastolic parameters are consistent with Grade II diastolic  dysfunction (pseudonormalization). Normal global longitudinal strain of  -19.8%.   2. Right ventricular systolic function is normal. The right ventricular  size is normal. There is normal pulmonary artery systolic pressure. The  estimated right ventricular systolic pressure is 35.9 mmHg.   3. Left atrial size was mildly dilated.   4. Right atrial size was mildly dilated.   5. The mitral valve is grossly normal. Trivial mitral valve  regurgitation.   6. The aortic valve has an indeterminant number of cusps. Aortic valve  regurgitation is not visualized.   7. The inferior vena cava is normal in size with greater than 50%  respiratory variability, suggesting right atrial pressure of 3 mmHg.     09/2020 RHC/LHC: LM patent, patent LAD stent, ramus patent, LCX patent, RCA 20% Mean PA 21 PCWP 16 CI 4.08     08/2021 heart monitor Patch Wear Time:  9 days and 17 hours (2023-07-10T13:32:00-0400 to 2023-07-20T06:44:39-0400)   Patient had a min HR of 46 bpm, max HR of 179 bpm, and avg HR of 58 bpm. Predominant underlying rhythm was Sinus Rhythm. 1 run of Ventricular Tachycardia occurred lasting 4 beats with a max rate of 169 bpm (avg 156 bpm). 31 Supraventricular Tachycardia  runs occurred, the run with the fastest interval  lasting 11 beats with a max rate of 179 bpm, the longest lasting 15.1 secs with an avg rate of 107 bpm. Isolated SVEs were occasional (2.5%, 16848), SVE Couplets were rare (<1.0%, 1094), and SVE Triplets  were rare (<1.0%, 59). Isolated VEs were rare (<1.0%, 685), VE Couplets were rare (<1.0%, 1), and VE Triplets were rare (<1.0%, 1). Ventricular Bigeminy was present.     Assessment and  Plan  1. CAD with other forms of angina.  - history of prior stent to LAD - has had chronic intermittent chest pains.  - Jan 2021 and Aug 2022 caths without significant disease.  - recent sharp chest pains awakening her from sleep, not typical for angina. She has some small jailed diags on cath, try adding ranexa and follow symptoms     2. Chronic diastolic HF - she is euvoelmic, continue current meds     3.HTN - compliant with meds   4. Dizziness/vertigo - ongoing symptoms - she reports room spinning when lying down flat, sitting or standing - her symptosm are not positional, not consistent with orthostatic symptoms or arrhythmia - asked to f/u back with her ENT        Antoine Poche, M.D., F.A.C.C.

## 2022-12-21 ENCOUNTER — Other Ambulatory Visit: Payer: Medicare HMO

## 2022-12-21 ENCOUNTER — Ambulatory Visit: Payer: Medicare HMO | Admitting: Family Medicine

## 2022-12-21 ENCOUNTER — Encounter: Payer: Self-pay | Admitting: Family Medicine

## 2022-12-21 VITALS — BP 138/76 | HR 55 | Temp 97.6°F | Ht 63.0 in | Wt 234.0 lb

## 2022-12-21 DIAGNOSIS — I152 Hypertension secondary to endocrine disorders: Secondary | ICD-10-CM

## 2022-12-21 DIAGNOSIS — M810 Age-related osteoporosis without current pathological fracture: Secondary | ICD-10-CM

## 2022-12-21 DIAGNOSIS — E1169 Type 2 diabetes mellitus with other specified complication: Secondary | ICD-10-CM | POA: Diagnosis not present

## 2022-12-21 DIAGNOSIS — Z794 Long term (current) use of insulin: Secondary | ICD-10-CM | POA: Diagnosis not present

## 2022-12-21 DIAGNOSIS — I25119 Atherosclerotic heart disease of native coronary artery with unspecified angina pectoris: Secondary | ICD-10-CM

## 2022-12-21 DIAGNOSIS — N1832 Chronic kidney disease, stage 3b: Secondary | ICD-10-CM | POA: Diagnosis not present

## 2022-12-21 DIAGNOSIS — E785 Hyperlipidemia, unspecified: Secondary | ICD-10-CM

## 2022-12-21 DIAGNOSIS — Z0000 Encounter for general adult medical examination without abnormal findings: Secondary | ICD-10-CM

## 2022-12-21 DIAGNOSIS — E1159 Type 2 diabetes mellitus with other circulatory complications: Secondary | ICD-10-CM | POA: Diagnosis not present

## 2022-12-21 DIAGNOSIS — Z0001 Encounter for general adult medical examination with abnormal findings: Secondary | ICD-10-CM

## 2022-12-21 DIAGNOSIS — Z1159 Encounter for screening for other viral diseases: Secondary | ICD-10-CM | POA: Diagnosis not present

## 2022-12-21 DIAGNOSIS — J329 Chronic sinusitis, unspecified: Secondary | ICD-10-CM | POA: Diagnosis not present

## 2022-12-21 DIAGNOSIS — Z6841 Body Mass Index (BMI) 40.0 and over, adult: Secondary | ICD-10-CM | POA: Insufficient documentation

## 2022-12-21 DIAGNOSIS — R6889 Other general symptoms and signs: Secondary | ICD-10-CM | POA: Diagnosis not present

## 2022-12-21 DIAGNOSIS — J4540 Moderate persistent asthma, uncomplicated: Secondary | ICD-10-CM

## 2022-12-21 LAB — BAYER DCA HB A1C WAIVED: HB A1C (BAYER DCA - WAIVED): 5.9 % — ABNORMAL HIGH (ref 4.8–5.6)

## 2022-12-21 MED ORDER — IPRATROPIUM-ALBUTEROL 0.5-2.5 (3) MG/3ML IN SOLN
RESPIRATORY_TRACT | 0 refills | Status: AC
Start: 2022-12-21 — End: ?

## 2022-12-21 MED ORDER — PIOGLITAZONE HCL 15 MG PO TABS
15.0000 mg | ORAL_TABLET | Freq: Every day | ORAL | 3 refills | Status: DC
Start: 2022-12-21 — End: 2024-01-12

## 2022-12-21 MED ORDER — TRIAMTERENE-HCTZ 37.5-25 MG PO CAPS
1.0000 | ORAL_CAPSULE | Freq: Every day | ORAL | 3 refills | Status: DC
Start: 2022-12-21 — End: 2023-03-21

## 2022-12-21 MED ORDER — LISINOPRIL 20 MG PO TABS
10.0000 mg | ORAL_TABLET | Freq: Every day | ORAL | Status: DC
Start: 2022-12-21 — End: 2023-03-21

## 2022-12-21 MED ORDER — ATORVASTATIN CALCIUM 40 MG PO TABS
40.0000 mg | ORAL_TABLET | Freq: Every day | ORAL | 3 refills | Status: DC
Start: 2022-12-21 — End: 2023-01-27

## 2022-12-21 MED ORDER — ALBUTEROL SULFATE HFA 108 (90 BASE) MCG/ACT IN AERS
INHALATION_SPRAY | RESPIRATORY_TRACT | 3 refills | Status: DC
Start: 2022-12-21 — End: 2023-06-19

## 2022-12-21 MED ORDER — DOXYCYCLINE HYCLATE 100 MG PO TABS: 100.0000 mg | ORAL_TABLET | Freq: Two times a day (BID) | ORAL | 0 refills | Status: AC

## 2022-12-21 MED ORDER — METOPROLOL TARTRATE 25 MG PO TABS
25.0000 mg | ORAL_TABLET | Freq: Two times a day (BID) | ORAL | 1 refills | Status: DC
Start: 2022-12-21 — End: 2023-09-01

## 2022-12-21 NOTE — Progress Notes (Signed)
Amber Reeves is a 72 y.o. female presents to office today for annual physical exam examination.    Concerns today include: 1. Type 2 Diabetes with hypertension, hyperlipidemia CKD3b and CAD:  She reports compliance with medications.  She is currently getting Evaristo Bury through the patient assistance program.  No reports of hypoglycemia.  She is not very physically active.  Last eye exam: needs Last foot exam: UTD Last A1c:  Lab Results  Component Value Date   HGBA1C 5.7 (H) 06/29/2022   Nephropathy screen indicated?: UTD, sees Dr Wolfgang Phoenix Last flu, zoster and/or pneumovax:  Immunization History  Administered Date(s) Administered   Influenza, High Dose Seasonal PF 02/16/2017   Influenza,inj,Quad PF,6+ Mos 12/18/2013   Moderna Sars-Covid-2 Vaccination 05/01/2019, 05/29/2019, 02/18/2020    ROS: She reports dizziness has been actually somewhat better after her kidney doctor reduced her lisinopril to 10 mg daily.  She reports no chest pain, shortness of breath outside of baseline.  She has had a URI see below  2.  URI She reports about a week history of congestion, frontal sinus pain.  She has been wheezing a little bit but no productive cough, nausea, vomiting, diarrhea.  She did feel maybe like she had a fever last evening but did not measure.  Has not done any home COVID tests.  No known exposures.  Her husband is sick with similar  Occupation: retired, Marital status: married to [place], Substance use: none Health Maintenance Due  Topic Date Due   Colonoscopy  Never done   OPHTHALMOLOGY EXAM  08/25/2021   Refills needed today: all  Immunization History  Administered Date(s) Administered   Influenza, High Dose Seasonal PF 02/16/2017   Influenza,inj,Quad PF,6+ Mos 12/18/2013   Moderna Sars-Covid-2 Vaccination 05/01/2019, 05/29/2019, 02/18/2020   Past Medical History:  Diagnosis Date   Allergy    Anxiety    Arthritis    Asthma    Coronary artery disease    Depression     Diabetes mellitus without complication (HCC)    Dyspnea    GERD (gastroesophageal reflux disease)    Hyperlipidemia    Hypertension    Pneumonia    Social History   Socioeconomic History   Marital status: Married    Spouse name: Peyton Najjar   Number of children: 3   Years of education: Not on file   Highest education level: Not on file  Occupational History   Occupation: retired  Tobacco Use   Smoking status: Never   Smokeless tobacco: Never  Vaping Use   Vaping status: Never Used  Substance and Sexual Activity   Alcohol use: Never   Drug use: Never   Sexual activity: Not Currently  Other Topics Concern   Not on file  Social History Narrative   Resides at home with her husband.  She is originally from [place] but relocated to [place] and then to [place] in August 2020.  She has a son who lives about 2 hours away in [place] and a sister-in-law who lives in [place].   2 children in [place]   Social Determinants of Health   Financial Resource Strain: Low Risk  (08/01/2022)   Overall Financial Resource Strain (CARDIA)    Difficulty of Paying Living Expenses: Not hard at all  Food Insecurity: No Food Insecurity (08/01/2022)   Hunger Vital Sign    Worried About Running Out of Food in the Last Year: Never true    Ran Out of Food in the Last Year: Never true  Transportation Needs: No Transportation Needs (08/01/2022)   PRAPARE - Administrator, Civil Service (Medical): No    Lack of Transportation (Non-Medical): No  Physical Activity: Inactive (08/01/2022)   Exercise Vital Sign    Days of Exercise per Week: 0 days    Minutes of Exercise per Session: 0 min  Stress: No Stress Concern Present (08/01/2022)   [employer] of Occupational Health - Occupational Stress Questionnaire    Feeling of Stress : Not at all  Social Connections: Moderately Isolated (08/01/2022)   Social Connection and Isolation Panel [NHANES]    Frequency of Communication with Friends and  Family: More than three times a week    Frequency of Social Gatherings with Friends and Family: More than three times a week    Attends Religious Services: Never    Database administrator or Organizations: No    Attends Banker Meetings: Never    Marital Status: Married  Catering manager Violence: Not At Risk (08/01/2022)   Humiliation, Afraid, Rape, and Kick questionnaire    Fear of Current or Ex-Partner: No    Emotionally Abused: No    Physically Abused: No    Sexually Abused: No   Past Surgical History:  Procedure Laterality Date   ABDOMINAL HYSTERECTOMY     BREAST EXCISIONAL BIOPSY     CESAREAN SECTION     3   CHOLECYSTECTOMY  2018   CORONARY STENT PLACEMENT  10/2017   [place] [facility] hospital)   IR 3D INDEPENDENT WKST  11/26/2021   IR ANGIO INTRA EXTRACRAN SEL COM CAROTID INNOMINATE UNI R MOD SED  10/21/2021   IR ANGIO INTRA EXTRACRAN SEL COM CAROTID INNOMINATE UNI R MOD SED  05/13/2022   IR ANGIO INTRA EXTRACRAN SEL INTERNAL CAROTID UNI L MOD SED  10/21/2021   IR ANGIO INTRA EXTRACRAN SEL INTERNAL CAROTID UNI R MOD SED  11/26/2021   IR ANGIO VERTEBRAL SEL VERTEBRAL UNI L MOD SED  10/21/2021   IR ANGIOGRAM FOLLOW UP STUDY  11/26/2021   IR ANGIOGRAM FOLLOW UP STUDY  11/26/2021   IR ANGIOGRAM FOLLOW UP STUDY  11/26/2021   IR TRANSCATH/EMBOLIZ  11/26/2021   LEFT HEART CATH AND CORONARY ANGIOGRAPHY N/A 02/25/2019   Procedure: LEFT HEART CATH AND CORONARY ANGIOGRAPHY;  Surgeon: Yvonne Kendall, MD;  Location: [facility] INVASIVE CV LAB;  Service: Cardiovascular;  Laterality: N/A;   RADIOLOGY WITH ANESTHESIA N/A 11/26/2021   Procedure: 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;  Surgeon: Lisbeth Renshaw, MD;  Location: [facility] OR;  Service: Radiology;  Laterality: N/A;   REPLACEMENT TOTAL KNEE BILATERAL Bilateral    done in [place] (2003/2005)   RIGHT/LEFT HEART CATH AND CORONARY ANGIOGRAPHY N/A 09/22/2020   Procedure: RIGHT/LEFT HEART CATH AND CORONARY ANGIOGRAPHY;   Surgeon: Marykay Lex, MD;  Location: [facility] INVASIVE CV LAB;  Service: Cardiovascular;  Laterality: N/A;   TUBAL LIGATION     Family History  Problem Relation Age of Onset   Heart disease Mother    Stroke Mother    Hypertension Mother    Cancer Father    Stroke Father     Current Outpatient Medications:    Alcohol Swabs (B-D SINGLE USE SWABS REGULAR) PADS, Apply topically., Disp: , Rfl:    aspirin EC 81 MG tablet, Take 81 mg by mouth in the morning., Disp: , Rfl:    Blood Glucose Calibration (ACCU-CHEK AVIVA) SOLN, 2 Bottles by Other route as needed., Disp: , Rfl:    Budeson-Glycopyrrol-Formoterol (BREZTRI  AEROSPHERE) 160-9-4.8 MCG/ACT AERO, Inhale 2 puffs into the lungs 2 (two) times daily., Disp: 32.1 g, Rfl: 11   Calcium Carb-Cholecalciferol (CALCIUM 600+D3 PO), Take 1 tablet by mouth in the morning., Disp: , Rfl:    diazepam (VALIUM) 2 MG tablet, Take 1 tablet (2 mg total) by mouth daily as needed (vertigo that is not relieved by meclizine)., Disp: 30 tablet, Rfl: 0   diphenhydrAMINE (BENADRYL) 25 MG tablet, Take 50 mg by mouth daily as needed for allergies., Disp: , Rfl:    doxycycline (VIBRA-TABS) 100 MG tablet, Take 1 tablet (100 mg total) by mouth 2 (two) times daily for 7 days., Disp: 14 tablet, Rfl: 0   fluticasone (FLONASE) 50 MCG/ACT nasal spray, SPRAY 2 SPRAYS INTO EACH NOSTRIL EVERY DAY, Disp: 48 mL, Rfl: 2   glucose blood test strip, TEST BLOOD SUGAR TWICE DAILY, Disp: , Rfl:    ibuprofen (ADVIL) 200 MG tablet, Take 400-600 mg by mouth daily as needed (pain (leg pain))., Disp: , Rfl:    insulin degludec (TRESIBA FLEXTOUCH) 200 UNIT/ML FlexTouch Pen, Inject 40 Units into the skin daily., Disp: 18 mL, Rfl: 0   Insulin Syringe-Needle U-100 31G X 5/16" 0.5 ML MISC, Use to inject Insulin four times daily: Use as directed; Dx: E11.9, E66.9, Disp: , Rfl:    lansoprazole (PREVACID) 15 MG capsule, Take 30 mg by mouth at bedtime., Disp: , Rfl:    montelukast (SINGULAIR) 10 MG  tablet, TAKE 1 TABLET BY MOUTH EVERYDAY AT BEDTIME, Disp: 90 tablet, Rfl: 3   nitroGLYCERIN (NITROSTAT) 0.4 MG SL tablet, Place 1 tablet (0.4 mg total) under the tongue every 5 (five) minutes x 3 doses as needed for chest pain (if no relief after 2nd dose, proceed to ED or call 911)., Disp: 25 tablet, Rfl: 3   Vitamin D, Ergocalciferol, (DRISDOL) 1.25 MG (50000 UNIT) CAPS capsule, Take 50,000 Units by mouth once a week., Disp: , Rfl:    albuterol (VENTOLIN HFA) 108 (90 Base) MCG/ACT inhaler, TAKE 2 PUFFS BY MOUTH EVERY 4 HOURS AS NEEDED FOR WHEEZE, Disp: 18 g, Rfl: 3   atorvastatin (LIPITOR) 40 MG tablet, Take 1 tablet (40 mg total) by mouth at bedtime., Disp: 90 tablet, Rfl: 3   ipratropium-albuterol (DUONEB) 0.5-2.5 (3) MG/3ML SOLN, INHALE CONTENTS OF 1 VIAL VIA NEBULIZER EVERY 6 HOURS AS NEEDED FOR WHEEZING, Disp: 360 mL, Rfl: 0   lisinopril (ZESTRIL) 20 MG tablet, Take 0.5 tablets (10 mg total) by mouth daily., Disp: , Rfl:    metoprolol tartrate (LOPRESSOR) 25 MG tablet, Take 1 tablet (25 mg total) by mouth 2 (two) times daily., Disp: 180 tablet, Rfl: 1   pioglitazone (ACTOS) 15 MG tablet, Take 1 tablet (15 mg total) by mouth daily. Note dose change, Disp: 90 tablet, Rfl: 3   triamterene-hydrochlorothiazide (DYAZIDE) 37.5-25 MG capsule, Take 1 each (1 capsule total) by mouth daily., Disp: 90 capsule, Rfl: 3  Allergies  Allergen Reactions   Aspartame Rash   Penicillins Itching and Rash    Did it involve swelling of the face/tongue/throat, SOB, or low BP? No Did it involve sudden or severe rash/hives, skin peeling, or any reaction on the inside of your mouth or nose? No Did you need to seek medical attention at a hospital or doctor's office? No When did it last happen?~25 years ago       If all above answers are "NO", may proceed with cephalosporin use.    Marcelline Deist [Dapagliflozin]     UTIs  Glucose    Metformin     Other Reaction(s): Dizziness  GI distress, dizziness   Metformin And  Related     GI distress, dizziness   No [facility] Food Allergies Other (See Comments)    Honey-Rash Artificial Sweeteners-rash Dextrose "measles like symptoms" Beet sugar "measles like symptoms"   Paxil [Paroxetine Hcl] Other (See Comments)    Twitching/feels like skin crawling   Prozac [Fluoxetine Hcl] Other (See Comments)    Twitching/feels like skin crawling   Zoloft [Sertraline Hcl] Other (See Comments)    Twitching/feels like skin crawling   Prednisone Palpitations   Ranexa [Ranolazine] Palpitations    Heart racing     ROS: Review of Systems Pertinent items noted in HPI and remainder of comprehensive ROS otherwise negative.    Physical exam BP 138/76   Pulse (!) 55   Temp 97.6 F (36.4 C)   Ht 5\' 3"  (1.6 m)   Wt 234 lb (106.1 kg)   SpO2 100%   BMI 41.45 kg/m  General appearance: alert, cooperative, appears stated age, no distress, and morbidly obese Head: Normocephalic, without obvious abnormality, atraumatic Eyes: negative findings: lids and lashes normal and conjunctivae and sclerae normal Ears: normal TM's and external ear canals both ears Nose: Nares normal. Septum midline. Mucosa normal. No drainage or sinus tenderness. Throat: lips, mucosa, and tongue normal; teeth and gums normal Neck: no adenopathy, supple, symmetrical, trachea midline, and thyroid not enlarged, symmetric, no tenderness/mass/nodules Back:  Increased kyphosis of the thoracic spine present.  No midline tenderness palpation Lungs:  Mild reduction in air movement but no overt wheezes, rhonchi or rales noted.  Normal work of breathing on room air.  No dyspnea with exertion appreciated Heart: regular rate and rhythm, S1, S2 normal, no murmur, click, rub or gallop Abdomen: soft, non-tender; bowel sounds normal; no masses,  no organomegaly and obese Extremities: extremities normal, atraumatic, no cyanosis or edema Pulses: 2+ and symmetric Skin: Skin color, texture, turgor normal. No rashes or  lesions Lymph nodes: Cervical, supraclavicular, and axillary nodes normal. Neurologic: Grossly normal      12/21/2022    1:31 PM 08/01/2022    9:24 AM 06/29/2022    1:05 PM  Depression screen PHQ 2/9  Decreased Interest 0 0 2  Down, Depressed, Hopeless 0 0 0  PHQ - 2 Score 0 0 2  Altered sleeping 0 0 0  Tired, decreased energy 0 0 3  Change in appetite 0 0 0  Feeling bad or failure about yourself  0 0 2  Trouble concentrating 0 0 0  Moving slowly or fidgety/restless 0 0 0  Suicidal thoughts 0 0 0  PHQ-9 Score 0 0 7  Difficult doing work/chores Not difficult at all Not difficult at all Somewhat difficult      12/21/2022    9:19 AM 06/29/2022    1:04 PM 02/11/2022    9:10 AM 01/07/2022    9:10 AM  GAD 7 : Generalized Anxiety Score  Nervous, Anxious, on Edge 1 2 1 1   Control/stop worrying 1 0 2 2  Worry too much - different things 0 0 2 2  Trouble relaxing 0 0 2 2  Restless 0 0 1 1  Easily annoyed or irritable 0 0 0 0  Afraid - awful might happen 0 0 0 1  Total GAD 7 Score 2 2 8 9   Anxiety Difficulty Not difficult at all Not difficult at all Somewhat difficult Not difficult at all     Assessment/  Plan: Roddie Mc here for annual physical exam.   Annual physical exam  Controlled type 2 diabetes mellitus with other specified complication, with long-term current use of insulin (HCC) - Plan: CMP14+EGFR, Bayer DCA Hb A1c Waived, pioglitazone (ACTOS) 15 MG tablet  Hyperlipidemia associated with type 2 diabetes mellitus (HCC) - Plan: CMP14+EGFR, Lipid Panel  Hypertension associated with diabetes (HCC) - Plan: CMP14+EGFR, lisinopril (ZESTRIL) 20 MG tablet, metoprolol tartrate (LOPRESSOR) 25 MG tablet, triamterene-hydrochlorothiazide (DYAZIDE) 37.5-25 MG capsule  Coronary artery disease involving native coronary artery of native heart with angina pectoris (HCC) - Plan: CMP14+EGFR, Lipid Panel, atorvastatin (LIPITOR) 40 MG tablet, metoprolol tartrate (LOPRESSOR) 25 MG  tablet  Stage 3b chronic kidney disease (HCC) - Plan: CMP14+EGFR, CBC, VITAMIN D 25 Hydroxy (Vit-D Deficiency, Fractures)  Morbid obesity (HCC) - Plan: CMP14+EGFR, Lipid Panel  Age-related osteoporosis without current pathological fracture  Encounter for hepatitis C screening test for low risk patient - Plan: Hepatitis C antibody  Rhinosinusitis - Plan: COVID-19, Flu A+B and RSV, doxycycline (VIBRA-TABS) 100 MG tablet  Moderate persistent asthma without complication - Plan: albuterol (VENTOLIN HFA) 108 (90 Base) MCG/ACT inhaler, ipratropium-albuterol (DUONEB) 0.5-2.5 (3) MG/3ML SOLN  She wants to hold off on scheduling any colonoscopy because she is trying to work through both her own health and her husband's health  Sugar remains under good control.  No changes needed.  Patient assistance program for [place].  Remainder of meds have been renewed  Her blood pressure is controlled.  No changes needed.  Rx renewed  Lipids collected.  Continue statin to prevent progressive CAD  Check renal function, CBC, vitamin D given CKD 3B.  Continue to follow-up with Dr. Wolfgang Phoenix as directed  Needs to update DEXA scan at some point.  Check vitamin D given known osteoporosis  Screening hepatitis C collected  I have given her a pocket prescription for doxycycline for the rhinosinusitis that I think is likely viral at this point.  I have renewed her albuterol and DuoNeb.  Triple swab collected  Counseled on healthy lifestyle choices, including diet (rich in fruits, vegetables and lean meats and low in salt and simple carbohydrates) and exercise (at least 30 minutes of moderate physical activity daily).  Patient to follow up 49m  Mariyah Upshaw M. Nadine Counts, DO

## 2022-12-22 LAB — LIPID PANEL
Chol/HDL Ratio: 3.9 ratio (ref 0.0–4.4)
Cholesterol, Total: 131 mg/dL (ref 100–199)
HDL: 34 mg/dL — ABNORMAL LOW (ref >39)
LDL Chol Calc (NIH): 73 mg/dL (ref 0–99)
Triglycerides: 137 mg/dL (ref 0–149)
VLDL Cholesterol Cal: 24 mg/dL (ref 5–40)

## 2022-12-22 LAB — CMP14+EGFR
ALT: 20 [IU]/L (ref 0–32)
AST: 19 IU/L (ref 0–40)
Albumin: 3.8 g/dL (ref 3.8–4.8)
Alkaline Phosphatase: 70 IU/L (ref 44–121)
BUN/Creatinine Ratio: 13 (ref 12–28)
BUN: 27 mg/dL (ref 8–27)
Bilirubin Total: 0.6 mg/dL (ref 0.0–1.2)
CO2: 19 mmol/L — ABNORMAL LOW (ref 20–29)
Calcium: 8.5 mg/dL — ABNORMAL LOW (ref 8.7–10.3)
Chloride: 100 mmol/L (ref 96–106)
Creatinine, Ser: 2.04 mg/dL — ABNORMAL HIGH (ref 0.57–1.00)
Globulin, Total: 3.1 g/dL (ref 1.5–4.5)
Glucose: 134 mg/dL — ABNORMAL HIGH (ref 70–99)
Potassium: 4.1 mmol/L (ref 3.5–5.2)
Sodium: 136 mmol/L (ref 134–144)
Total Protein: 6.9 g/dL (ref 6.0–8.5)
eGFR: 25 mL/min/{1.73_m2} — ABNORMAL LOW (ref >59)

## 2022-12-22 LAB — CBC
Hematocrit: 37.6 % (ref 34.0–46.6)
Hemoglobin: 11.8 g/dL (ref 11.1–15.9)
MCH: 30.9 pg (ref 26.6–33.0)
MCHC: 31.4 g/dL — ABNORMAL LOW (ref 31.5–35.7)
MCV: 98 fL — ABNORMAL HIGH (ref 79–97)
Platelets: 202 10*3/uL (ref 150–450)
RBC: 3.82 x10E6/uL (ref 3.77–5.28)
RDW: 13.2 % (ref 11.7–15.4)
WBC: 5 10*3/uL (ref 3.4–10.8)

## 2022-12-22 LAB — COVID-19, FLU A+B AND RSV
Influenza A, NAA: NOT DETECTED
Influenza B, NAA: NOT DETECTED
RSV, NAA: NOT DETECTED
SARS-CoV-2, NAA: DETECTED — AB

## 2022-12-22 LAB — HEPATITIS C ANTIBODY: Hep C Virus Ab: NONREACTIVE

## 2022-12-22 LAB — VITAMIN D 25 HYDROXY (VIT D DEFICIENCY, FRACTURES): Vit D, 25-Hydroxy: 33.3 ng/mL (ref 30.0–100.0)

## 2023-01-09 NOTE — Progress Notes (Signed)
Pharmacy Medication Assistance Program Note    01/09/2023  Patient ID: Amber Reeves, female   DOB: 02/12/1951, 72 y.o.   MRN: [id number]     01/09/2023  [employer]  [employer] [facility] Evaristo Bury  Dose of Evaristo Bury U100  Type of Sport and exercise psychologist  Patient Assistance Determination Approved  Approval Start Date 12/29/2022  Approval End Date 02/21/2024

## 2023-01-27 ENCOUNTER — Telehealth: Payer: Self-pay

## 2023-01-27 MED ORDER — ATORVASTATIN CALCIUM 80 MG PO TABS
80.0000 mg | ORAL_TABLET | Freq: Every day | ORAL | 3 refills | Status: DC
Start: 2023-01-27 — End: 2024-01-12

## 2023-01-27 NOTE — Telephone Encounter (Signed)
Patient notified and verbalized understanding. Patient had no questions or concerns at this time.  

## 2023-01-27 NOTE — Telephone Encounter (Signed)
-----   Message from Dina Rich sent at 01/27/2023 12:45 PM EST ----- Cholesterol too high, can she increase atorvastatin to 80mg  daily please  Dominga Ferry MD

## 2023-02-06 DIAGNOSIS — N189 Chronic kidney disease, unspecified: Secondary | ICD-10-CM | POA: Diagnosis not present

## 2023-02-06 DIAGNOSIS — E559 Vitamin D deficiency, unspecified: Secondary | ICD-10-CM | POA: Diagnosis not present

## 2023-02-06 DIAGNOSIS — D638 Anemia in other chronic diseases classified elsewhere: Secondary | ICD-10-CM | POA: Diagnosis not present

## 2023-02-06 DIAGNOSIS — N1832 Chronic kidney disease, stage 3b: Secondary | ICD-10-CM | POA: Diagnosis not present

## 2023-02-06 DIAGNOSIS — I5032 Chronic diastolic (congestive) heart failure: Secondary | ICD-10-CM | POA: Diagnosis not present

## 2023-02-06 DIAGNOSIS — N2581 Secondary hyperparathyroidism of renal origin: Secondary | ICD-10-CM | POA: Diagnosis not present

## 2023-02-17 ENCOUNTER — Ambulatory Visit: Payer: Medicare HMO | Admitting: Student

## 2023-02-27 DIAGNOSIS — G4733 Obstructive sleep apnea (adult) (pediatric): Secondary | ICD-10-CM | POA: Diagnosis not present

## 2023-02-27 DIAGNOSIS — I5032 Chronic diastolic (congestive) heart failure: Secondary | ICD-10-CM | POA: Diagnosis not present

## 2023-02-27 DIAGNOSIS — I129 Hypertensive chronic kidney disease with stage 1 through stage 4 chronic kidney disease, or unspecified chronic kidney disease: Secondary | ICD-10-CM | POA: Diagnosis not present

## 2023-02-27 DIAGNOSIS — N184 Chronic kidney disease, stage 4 (severe): Secondary | ICD-10-CM | POA: Diagnosis not present

## 2023-02-27 DIAGNOSIS — E1122 Type 2 diabetes mellitus with diabetic chronic kidney disease: Secondary | ICD-10-CM | POA: Diagnosis not present

## 2023-03-21 ENCOUNTER — Encounter: Payer: Self-pay | Admitting: Student

## 2023-03-21 ENCOUNTER — Ambulatory Visit: Payer: Medicare HMO | Attending: Student | Admitting: Student

## 2023-03-21 VITALS — BP 118/68 | HR 54 | Ht 63.0 in | Wt 233.2 lb

## 2023-03-21 DIAGNOSIS — I5032 Chronic diastolic (congestive) heart failure: Secondary | ICD-10-CM | POA: Diagnosis not present

## 2023-03-21 DIAGNOSIS — N1832 Chronic kidney disease, stage 3b: Secondary | ICD-10-CM

## 2023-03-21 DIAGNOSIS — I25118 Atherosclerotic heart disease of native coronary artery with other forms of angina pectoris: Secondary | ICD-10-CM

## 2023-03-21 DIAGNOSIS — E785 Hyperlipidemia, unspecified: Secondary | ICD-10-CM

## 2023-03-21 DIAGNOSIS — I10 Essential (primary) hypertension: Secondary | ICD-10-CM | POA: Diagnosis not present

## 2023-03-21 MED ORDER — ASPIRIN 81 MG PO TBEC: 81.0000 mg | DELAYED_RELEASE_TABLET | Freq: Every day | ORAL | Status: AC

## 2023-03-21 MED ORDER — LISINOPRIL 5 MG PO TABS: 5.0000 mg | ORAL_TABLET | Freq: Every day | ORAL | 3 refills | Status: DC

## 2023-03-21 NOTE — Patient Instructions (Signed)
Medication Instructions:   Restart Aspirin 81 mg Daily  Decrease Lisinopril to 5 mg Daily    *If you need a refill on your cardiac medications before your next appointment, please call your pharmacy*   Lab Work: Your physician recommends that you return for lab work in 2 Months ( Fasting )   If you have labs (blood work) drawn today and your tests are completely normal, you will receive your results only by: MyChart Message (if you have MyChart) OR A paper copy in the mail If you have any lab test that is abnormal or we need to change your treatment, we will call you to review the results.   Testing/Procedures: NONE    Follow-Up: At [facility], you and your health needs are our priority.  As part of our continuing mission to provide you with exceptional heart care, we have created designated Provider Care Teams.  These Care Teams include your primary Cardiologist (physician) and Advanced Practice Providers (APPs -  Physician Assistants and Nurse Practitioners) who all work together to provide you with the care you need, when you need it.  We recommend signing up for the patient portal called "MyChart".  Sign up information is provided on this After Visit Summary.  MyChart is used to connect with patients for Virtual Visits (Telemedicine).  Patients are able to view lab/test results, encounter notes, upcoming appointments, etc.  Non-urgent messages can be sent to your provider as well.   To learn more about what you can do with MyChart, go to [email or web address].    Your next appointment:   4 -5 month(s)  Provider:   Dina Rich, MD    Other Instructions Thank you for choosing [facility]!

## 2023-03-21 NOTE — Progress Notes (Signed)
Cardiology Office Note    Date:  03/21/2023  ID:  Amber Reeves 28-Jan-1951, MRN [id number] Cardiologist: Amber Rich, MD    History of Present Illness:    Amber Reeves is a 74 y.o. female with past medical history of CAD (s/p DES to LAD in 11/2017, cardiac catheterization in 09/2020 showing patent LAD stent and several tiny diagonals which were jailed by prior stent and too small for intervention with medical management recommended), chronic HFpEF, HTN, HLD, OSA (intolerant to CPAP and repeat sleep study in 2024 showed mild OSA), cerebral aneurysm (s/p stent and coiling in 11/2021) and brief SVT by prior monitor who presents to the office today for overdue 70-month follow-up.  She was last examined by Dr. Wyline Reeves in 04/2022 and reported occasional episodes of a left-sided chest discomfort which was a sharp/stabbing pain and was not associated with exertion. Symptoms were overall felt to be atypical for angina and was recommended to try starting Ranexa 500 mg twice daily and following symptoms. She did report intermittent dizziness/vertigo as well and symptoms were not consistent with orthostasis or Reeves arrhythmia, therefore was recommend to follow-up with ENT.  In talking with the patient and her husband today, she reports having episodic dizziness over the past few years. Has been evaluated by multiple providers including ENT and was diagnosed with Reeves aneurysm but did not notice significant improvement in her dizziness following treatment for this. Was also followed by PT and was noted to have hypotension at that time which led to dose reduction of Lisinopril and she did notice some improvement with this but still has episodic dizziness. Symptoms typically occur when sitting still and are not associated with positional changes. She has baseline dyspnea but no acute changes in this. Notes intermittent chest discomfort but this typically occurs at rest as well. Does not resemble her  prior angina. No specific orthopnea or PND. Says that she did stop all of her medications late last year as she felt like they were contributing to dizziness and she has gradually been adding these back. She did stop Imdur and did not notice any change in her angina. Has been off ASA 81 mg daily as well. She is still taking Atorvastatin 80 mg daily, Lopressor 25 mg twice daily, HCTZ 12.5 mg daily and Lisinopril 10 mg daily. She is also being followed by Dr. Wolfgang Reeves for CKD.    Studies Reviewed:   EKG: EKG is ordered today and demonstrates:   EKG Interpretation Date/Time:  Tuesday March 21 2023 15:26:39 EST Ventricular Rate:  56 PR Interval:  176 QRS Duration:  88 QT Interval:  452 QTC Calculation: 436 R Axis:   -31  Text Interpretation: Sinus bradycardia Left axis deviation Low voltage QRS Confirmed by Amber Reeves ([id number]) on 03/21/2023 5:04:17 PM       R/LHC: 09/2020   Prox LAD lesion is 15% stenosed.   Prox LAD to Mid LAD Long stented segment is widely patent -several very small diagonal branches are somewhat jailed, but less than 1 mm diameter.   Prox Cx lesion is 15% stenosed.   Ost RCA lesion is 20% stenosed.   LV end diastolic pressure is mildly elevated.   No evidence of Pulmonary Hypertension: PAP mean 21 mmHg with LVEDP and PCWP of 18 mmHg.   SUMMARY Stable single-vessel CAD with widely patent LAD stent.  LAD tapers distally to a very small caliber vessel with a small diagonal branch.  Several tiny diagonal branches are jailed by  the stent, but no options for PCI.   Stable from there is catheterization in January 2021. Otherwise normal large-caliber LCx and RCA. Mild to moderately elevated LVEDP and PCWP with normal Right Heart Cath Pressures-no evidence of Pulmonary Hypertension. Systemic Hypertension Cardiac Output and Index (Fick) 8.37-4.08.   PVR 0.6-1.22 Amber Reeves   Consider microvascular disease, hypertensive heart disease as other potential etiologies from a  cardiac standpoint for chest pain versus noncardiac chest pain.   Consider calcium channel blocker and beta-blocker titration as opposed to long-acting nitrate.     Amber Lemma, MD   Event Monitor: 08/2021 Patch Wear Time:  9 days and 17 hours (2023-07-10T13:32:00-0400 to 2023-07-20T06:44:39-0400)   Patient had a min HR of 46 bpm, max HR of 179 bpm, and avg HR of 58 bpm. Predominant underlying rhythm was Sinus Rhythm. 1 run of Ventricular Tachycardia occurred lasting 4 beats with a max rate of 169 bpm (avg 156 bpm). 31 Supraventricular Tachycardia  runs occurred, the run with the fastest interval lasting 11 beats with a max rate of 179 bpm, the longest lasting 15.1 secs with Reeves avg rate of 107 bpm. Isolated SVEs were occasional (2.5%, 16848), SVE Couplets were rare (<1.0%, 1094), and SVE Triplets  were rare (<1.0%, 59). Isolated VEs were rare (<1.0%, 685), VE Couplets were rare (<1.0%, 1), and VE Triplets were rare (<1.0%, 1). Ventricular Bigeminy was present.    Physical Exam:   VS:  BP 118/68   Pulse (!) 54   Ht 5\' 3"  (1.6 m)   Wt 233 lb 3.2 oz (105.8 kg)   SpO2 97%   BMI 41.31 kg/m    Wt Readings from Last 3 Encounters:  03/21/23 233 lb 3.2 oz (105.8 kg)  12/21/22 234 lb (106.1 kg)  11/01/22 246 lb 9.6 oz (111.9 kg)     GEN: Well nourished, well developed female appearing in no acute distress NECK: No JVD; No carotid bruits CARDIAC: RRR, no murmurs, rubs, gallops RESPIRATORY:  Clear to auscultation without rales, wheezing or rhonchi  ABDOMEN: Appears non-distended. No obvious abdominal masses. EXTREMITIES: No clubbing or cyanosis. Trace ankle edema bilaterally.  Distal pedal pulses are 2+ bilaterally.   Assessment and Plan:   1. CAD - She is s/p DES to LAD in 2019-most recent cardiac catheterization 09/2020 showed a patent stent as outlined above and medical management was recommended of very small diagonal branches which had been jailed by her previously placed stent.   - She has intermittent episodes of discomfort at rest but no progression of symptoms and not resembling her prior angina. She did try to simplify her medication regimen as discussed above and remains off Imdur at this time. Says that she did not start Ranexa following her last office visit as she was concerned about the side effects of this. For now, continue Atorvastatin 80 mg daily and Lopressor 25 mg twice daily. I did review with the patient and she is open to restarting ASA 81 mg daily as well.  2. Chronic HFpEF - Echocardiogram in 06/2020 showed a preserved EF of 65 to 70% but she did have grade 2 diastolic dysfunction and mild LVH. She is no longer on Lasix as this was discontinued by Nephrology given worsening renal function and she now takes HCTZ 12.5 mg daily. She does have scheduled follow-up with Dr. Wolfgang Reeves next month.  3. HTN - Her blood pressure is at 118/68 during today's visit but she has been having hypotension over the past several months intermittently as discussed  above. Symptoms previously improved with dose reduction of Lisinopril and she is currently taking 10 mg daily. Will further reduce to 5 mg daily to see if this helps with her symptoms. Remains on Lopressor 25 mg twice daily and she is also on HCTZ 12.5 mg daily per Nephrology. Would defer dose adjustment of HCTZ to Nephrology.  4. HLD - LDL was at 73 when checked in 11/2022 and Atorvastatin was increased to 80 mg daily. Will recheck Reeves FLP with upcoming labs.  5. Stage 3 CKD - Being followed by Nephrology. Creatinine was at 2.04 in 11/2022 and improved to 1.87 in 01/2023.  Signed, Ellsworth Lennox, PA-C

## 2023-04-03 MED ORDER — BREZTRI AEROSPHERE 160-9-4.8 MCG/ACT IN AERO: 2.0000 | INHALATION_SPRAY | Freq: Two times a day (BID) | RESPIRATORY_TRACT | 11 refills | Status: AC

## 2023-04-03 NOTE — Progress Notes (Signed)
 Pharmacy Medication Assistance Program Note    04/03/2023  Patient ID: Amber Reeves, female  DOB: Dec 30, 1950, 73 y.o.  MRN:  [id number]     04/03/2023  Outreach Medication Two  Manufacturer Medication Two [employer]  [facility]  Type of Sport and exercise psychologist  Patient Assistance Determination Approved    Please send new RX to [facility], thanks!

## 2023-04-03 NOTE — Telephone Encounter (Signed)
 Sent  Meds ordered this encounter  Medications   Budeson-Glycopyrrol-Formoterol  (BREZTRI  AEROSPHERE) 160-9-4.8 MCG/ACT AERO    Sig: Inhale 2 puffs into the lungs 2 (two) times daily.    Dispense:  32.1 g    Refill:  11

## 2023-04-07 ENCOUNTER — Other Ambulatory Visit: Payer: Self-pay | Admitting: Family Medicine

## 2023-04-07 DIAGNOSIS — H6993 Unspecified Eustachian tube disorder, bilateral: Secondary | ICD-10-CM

## 2023-04-11 DIAGNOSIS — E211 Secondary hyperparathyroidism, not elsewhere classified: Secondary | ICD-10-CM | POA: Diagnosis not present

## 2023-04-11 DIAGNOSIS — R809 Proteinuria, unspecified: Secondary | ICD-10-CM | POA: Diagnosis not present

## 2023-04-11 DIAGNOSIS — D631 Anemia in chronic kidney disease: Secondary | ICD-10-CM | POA: Diagnosis not present

## 2023-04-11 DIAGNOSIS — N189 Chronic kidney disease, unspecified: Secondary | ICD-10-CM | POA: Diagnosis not present

## 2023-04-12 LAB — LAB REPORT - SCANNED
A1c: 6.1
Creatinine, POC: 175 mg/dL
EGFR: 43

## 2023-04-25 ENCOUNTER — Encounter: Payer: Self-pay | Admitting: Pharmacist

## 2023-04-26 DIAGNOSIS — N2581 Secondary hyperparathyroidism of renal origin: Secondary | ICD-10-CM | POA: Diagnosis not present

## 2023-04-26 DIAGNOSIS — N1832 Chronic kidney disease, stage 3b: Secondary | ICD-10-CM | POA: Diagnosis not present

## 2023-04-26 DIAGNOSIS — E1122 Type 2 diabetes mellitus with diabetic chronic kidney disease: Secondary | ICD-10-CM | POA: Diagnosis not present

## 2023-04-26 DIAGNOSIS — I5032 Chronic diastolic (congestive) heart failure: Secondary | ICD-10-CM | POA: Diagnosis not present

## 2023-05-01 ENCOUNTER — Ambulatory Visit: Payer: Medicare HMO | Admitting: Primary Care

## 2023-05-19 ENCOUNTER — Other Ambulatory Visit: Payer: Self-pay | Admitting: Family Medicine

## 2023-06-19 ENCOUNTER — Ambulatory Visit (INDEPENDENT_AMBULATORY_CARE_PROVIDER_SITE_OTHER): Payer: Medicare HMO | Admitting: Family Medicine

## 2023-06-19 ENCOUNTER — Ambulatory Visit (INDEPENDENT_AMBULATORY_CARE_PROVIDER_SITE_OTHER)

## 2023-06-19 ENCOUNTER — Encounter: Payer: Self-pay | Admitting: Family Medicine

## 2023-06-19 VITALS — BP 136/73 | HR 70 | Temp 98.6°F | Ht 63.0 in | Wt 222.0 lb

## 2023-06-19 DIAGNOSIS — Z794 Long term (current) use of insulin: Secondary | ICD-10-CM

## 2023-06-19 DIAGNOSIS — Z780 Asymptomatic menopausal state: Secondary | ICD-10-CM

## 2023-06-19 DIAGNOSIS — Z1211 Encounter for screening for malignant neoplasm of colon: Secondary | ICD-10-CM | POA: Diagnosis not present

## 2023-06-19 DIAGNOSIS — J4540 Moderate persistent asthma, uncomplicated: Secondary | ICD-10-CM

## 2023-06-19 DIAGNOSIS — H6993 Unspecified Eustachian tube disorder, bilateral: Secondary | ICD-10-CM | POA: Diagnosis not present

## 2023-06-19 DIAGNOSIS — E1169 Type 2 diabetes mellitus with other specified complication: Secondary | ICD-10-CM | POA: Diagnosis not present

## 2023-06-19 DIAGNOSIS — E785 Hyperlipidemia, unspecified: Secondary | ICD-10-CM | POA: Diagnosis not present

## 2023-06-19 DIAGNOSIS — N1832 Chronic kidney disease, stage 3b: Secondary | ICD-10-CM

## 2023-06-19 DIAGNOSIS — E1159 Type 2 diabetes mellitus with other circulatory complications: Secondary | ICD-10-CM

## 2023-06-19 DIAGNOSIS — I25119 Atherosclerotic heart disease of native coronary artery with unspecified angina pectoris: Secondary | ICD-10-CM | POA: Diagnosis not present

## 2023-06-19 DIAGNOSIS — I152 Hypertension secondary to endocrine disorders: Secondary | ICD-10-CM

## 2023-06-19 LAB — BAYER DCA HB A1C WAIVED: HB A1C (BAYER DCA - WAIVED): 6.2 % — ABNORMAL HIGH (ref 4.8–5.6)

## 2023-06-19 MED ORDER — ALBUTEROL SULFATE HFA 108 (90 BASE) MCG/ACT IN AERS
INHALATION_SPRAY | RESPIRATORY_TRACT | 3 refills | Status: AC
Start: 2023-06-19 — End: ?

## 2023-06-19 MED ORDER — DESLORATADINE 5 MG PO TABS
5.0000 mg | ORAL_TABLET | ORAL | 3 refills | Status: DC
Start: 2023-06-19 — End: 2024-01-12

## 2023-06-19 MED ORDER — NITROGLYCERIN 0.4 MG SL SUBL: 0.4000 mg | SUBLINGUAL_TABLET | SUBLINGUAL | 3 refills | Status: AC | PRN

## 2023-06-19 MED ORDER — FLUTICASONE PROPIONATE 50 MCG/ACT NA SUSP
2.0000 | Freq: Every day | NASAL | 2 refills | Status: AC
Start: 2023-06-19 — End: ?

## 2023-06-19 NOTE — Progress Notes (Signed)
 Subjective: CC:DM PCP: Eliodoro Guerin, DO WUX:LKGMW Amber Reeves is a 73 y.o. female presenting to clinic today for:  1. Type 2 Diabetes with hypertension, hyperlipidemia w/ [id number] and CAD:  Patient is compliant with her Lipitor, lisinopril , HCTZ, Lopressor  and Tresiba .  She reports no hypoglycemic episodes.  She continues to follow-up with nephrology as directed for her renal dysfunction and cardiology as well  Diabetes Health Maintenance Due  Topic Date Due   OPHTHALMOLOGY EXAM  08/25/2021   HEMOGLOBIN A1C  06/21/2023   FOOT EXAM  06/29/2023    Last A1c:  Lab Results  Component Value Date   HGBA1C 5.9 (H) 12/21/2022    ROS: No reports chest pain, shortness of breath but she does report chronic fatigue.  She still has some very mild episodes of dizziness but this is much better than it had been since her lisinopril  has been reduced down to 5 mg daily by cardiology.  2.  Allergic rhinitis associated with asthma She is currently treated with Breztri  but needs refill on albuterol  and Flonase  today.   ROS: Per HPI  Allergies  Allergen Reactions   Aspartame Rash   Penicillins Itching and Rash    Did it involve swelling of the face/tongue/throat, SOB, or low BP? No Did it involve sudden or severe rash/hives, skin peeling, or any reaction on the inside of your mouth or nose? No Did you need to seek medical attention at a hospital or doctor's office? No When did it last happen?~25 years ago       If all above answers are "NO", may proceed with cephalosporin use.    Farxiga [Dapagliflozin]     UTIs   Glucose    Metformin     Other Reaction(s): Dizziness  GI distress, dizziness   Metformin And Related     GI distress, dizziness   No [facility] Food Allergies Other (See Comments)    Honey-Rash Artificial Sweeteners-rash Dextrose  "measles like symptoms" Beet sugar "measles like symptoms"   Paxil [Paroxetine Hcl] Other (See Comments)    Twitching/feels like skin  crawling   Prozac [Fluoxetine Hcl] Other (See Comments)    Twitching/feels like skin crawling   Zoloft [Sertraline Hcl] Other (See Comments)    Twitching/feels like skin crawling   Prednisone  Palpitations   Ranexa  [Ranolazine ] Palpitations    Heart racing   Past Medical History:  Diagnosis Date   Allergy    Anxiety    Arthritis    Asthma    Chronic kidney disease    Coronary artery disease    Depression    Diabetes mellitus without complication (HCC)    Dyspnea    GERD (gastroesophageal reflux disease)    Hyperlipidemia    Hypertension    Pneumonia     Current Outpatient Medications:    albuterol  (VENTOLIN  HFA) 108 (90 Base) MCG/ACT inhaler, TAKE 2 PUFFS BY MOUTH EVERY 4 HOURS AS NEEDED FOR WHEEZE, Disp: 18 g, Rfl: 3   Alcohol Swabs (B-D SINGLE USE SWABS REGULAR) PADS, Apply topically., Disp: , Rfl:    aspirin  EC 81 MG tablet, Take 1 tablet (81 mg total) by mouth daily. Swallow whole., Disp: , Rfl:    atorvastatin  (LIPITOR) 80 MG tablet, Take 1 tablet (80 mg total) by mouth daily., Disp: 90 tablet, Rfl: 3   Blood Glucose Calibration (ACCU-CHEK AVIVA) SOLN, 2 Bottles by Other route as needed., Disp: , Rfl:    Budeson-Glycopyrrol-Formoterol  (BREZTRI  AEROSPHERE) 160-9-4.8 MCG/ACT AERO, Inhale 2 puffs into the lungs 2 (  two) times daily., Disp: 32.1 g, Rfl: 11   cholecalciferol (VITAMIN D3) 25 MCG (1000 UNIT) tablet, Take 1,000 Units by mouth daily., Disp: , Rfl:    diphenhydrAMINE (BENADRYL) 25 MG tablet, Take 50 mg by mouth daily as needed for allergies., Disp: , Rfl:    fluticasone  (FLONASE ) 50 MCG/ACT nasal spray, SPRAY 2 SPRAYS INTO EACH NOSTRIL EVERY DAY, Disp: 48 mL, Rfl: 2   glucose blood test strip, TEST BLOOD SUGAR TWICE DAILY, Disp: , Rfl:    hydrochlorothiazide (HYDRODIURIL) 25 MG tablet, Take 25 mg by mouth daily., Disp: , Rfl:    insulin  degludec (TRESIBA  FLEXTOUCH) 200 UNIT/ML FlexTouch Pen, Inject 40 Units into the skin daily., Disp: 18 mL, Rfl: 0   Insulin   Syringe-Needle U-100 31G X 5/16" 0.5 ML MISC, Use to inject Insulin  four times daily: Use as directed; Dx: E11.9, E66.9, Disp: , Rfl:    ipratropium-albuterol  (DUONEB) 0.5-2.5 (3) MG/3ML SOLN, INHALE CONTENTS OF 1 VIAL VIA NEBULIZER EVERY 6 HOURS AS NEEDED FOR WHEEZING, Disp: 360 mL, Rfl: 0   lansoprazole (PREVACID) 15 MG capsule, Take 30 mg by mouth at bedtime., Disp: , Rfl:    lisinopril  (ZESTRIL ) 5 MG tablet, Take 1 tablet (5 mg total) by mouth daily., Disp: 90 tablet, Rfl: 3   metoprolol  tartrate (LOPRESSOR ) 25 MG tablet, Take 1 tablet (25 mg total) by mouth 2 (two) times daily., Disp: 180 tablet, Rfl: 1   montelukast  (SINGULAIR ) 10 MG tablet, TAKE 1 TABLET BY MOUTH EVERYDAY AT BEDTIME, Disp: 90 tablet, Rfl: 1   nitroGLYCERIN  (NITROSTAT ) 0.4 MG SL tablet, Place 1 tablet (0.4 mg total) under the tongue every 5 (five) minutes x 3 doses as needed for chest pain (if no relief after 2nd dose, proceed to ED or call 911)., Disp: 25 tablet, Rfl: 3   pioglitazone  (ACTOS ) 15 MG tablet, Take 1 tablet (15 mg total) by mouth daily. Note dose change, Disp: 90 tablet, Rfl: 3 Social History   Socioeconomic History   Marital status: Married    Spouse name: Hewitt Lou   Number of children: 3   Years of education: Not on file   Highest education level: 12th grade  Occupational History   Occupation: retired  Tobacco Use   Smoking status: Never   Smokeless tobacco: Never  Vaping Use   Vaping status: Never Used  Substance and Sexual Activity   Alcohol use: Never   Drug use: Never   Sexual activity: Not Currently  Other Topics Concern   Not on file  Social History Narrative   Resides at home with her husband.  She is originally from [place]  but relocated to [place]  and then to   in August 2020.  She has a son who lives about 2 hours away in [place] and a sister-in-law who lives in [place].   2 children in [place]    Social Drivers of Health   Financial Resource Strain: Low Risk  (06/14/2023)    Overall Financial Resource Strain (CARDIA)    Difficulty of Paying Living Expenses: Not very hard  Food Insecurity: Food Insecurity Present (06/14/2023)   Hunger Vital Sign    Worried About Running Out of Food in the Last Year: Sometimes true    Ran Out of Food in the Last Year: Never true  Transportation Needs: No Transportation Needs (06/14/2023)   PRAPARE - Administrator, Civil Service (Medical): No    Lack of Transportation (Non-Medical): No  Physical Activity: Insufficiently Active (06/14/2023)   Exercise Vital Sign  Days of Exercise per Week: 2 days    Minutes of Exercise per Session: 20 min  Stress: Stress Concern Present (06/14/2023)   [employer] of Occupational Health - Occupational Stress Questionnaire    Feeling of Stress : To some extent  Social Connections: Socially Isolated (06/14/2023)   Social Connection and Isolation Panel [NHANES]    Frequency of Communication with Friends and Family: Twice a week    Frequency of Social Gatherings with Friends and Family: Never    Attends Religious Services: Never    Database administrator or Organizations: No    Attends Banker Meetings: Never    Marital Status: Married  Catering manager Violence: Not At Risk (08/01/2022)   Humiliation, Afraid, Rape, and Kick questionnaire    Fear of Current or Ex-Partner: No    Emotionally Abused: No    Physically Abused: No    Sexually Abused: No   Family History  Problem Relation Age of Onset   Heart disease Mother    Stroke Mother    Hypertension Mother    Cancer Father    Stroke Father     Objective: Office vital signs reviewed. BP 136/73   Pulse 70   Temp 98.6 F (37 C)   Ht 5\' 3"  (1.6 m)   Wt 222 lb (100.7 kg)   SpO2 99%   BMI 39.33 kg/m   Physical Examination:  General: Awake, alert, well nourished, No acute distress HEENT: Sclera white.  TMs intact bilaterally.  Clear rhinorrhea present.  Oropharynx without erythema or exudates Cardio:  regular rate and rhythm, S1S2 heard, no murmurs appreciated Pulm: clear to auscultation bilaterally, no wheezes, rhonchi or rales; normal work of breathing on room air   Assessment/ Plan: 73 y.o. female   Controlled type 2 diabetes mellitus with other specified complication, with long-term current use of insulin  (HCC) - Plan: Bayer DCA Hb A1c Waived  Hyperlipidemia associated with type 2 diabetes mellitus (HCC) - Plan: Lipid Panel  Hypertension associated with diabetes (HCC)  Coronary artery disease involving native coronary artery of native heart with angina pectoris (HCC)  Stage 3b chronic kidney disease (HCC)  Colon cancer screening - Plan: Ambulatory referral to Gastroenterology  Moderate persistent asthma without complication - Plan: albuterol  (VENTOLIN  HFA) 108 (90 Base) MCG/ACT inhaler, desloratadine (CLARINEX) 5 MG tablet  Dysfunction of both eustachian tubes - Plan: fluticasone  (FLONASE ) 50 MCG/ACT nasal spray  Post-menopausal - Plan: DG [facility] DEXA  Diabetes remains under excellent control.  Her blood pressure is controlled.  Check fasting lipid  Renal function recently checked by nephrology.  I have printed out her urine microalbumin to be put into her file  Referral to gastroenterology for colon cancer screening  I have added Clarinex for allergy symptoms are not relieved by Singulair .  Albuterol  and Flonase  also renewed  DEXA scan scheduled  Amber Jezek Bambi Bonine, DO [facility] [phone]

## 2023-06-20 ENCOUNTER — Encounter: Payer: Self-pay | Admitting: *Deleted

## 2023-06-20 LAB — LIPID PANEL
Chol/HDL Ratio: 3.3 ratio (ref 0.0–4.4)
Cholesterol, Total: 107 mg/dL (ref 100–199)
HDL: 32 mg/dL — ABNORMAL LOW (ref >39)
LDL Chol Calc (NIH): 55 mg/dL (ref 0–99)
Triglycerides: 108 mg/dL (ref 0–149)
VLDL Cholesterol Cal: 20 mg/dL (ref 5–40)

## 2023-06-22 DIAGNOSIS — Z780 Asymptomatic menopausal state: Secondary | ICD-10-CM | POA: Diagnosis not present

## 2023-06-22 DIAGNOSIS — M810 Age-related osteoporosis without current pathological fracture: Secondary | ICD-10-CM | POA: Diagnosis not present

## 2023-06-27 ENCOUNTER — Encounter: Payer: Self-pay | Admitting: Family Medicine

## 2023-06-28 ENCOUNTER — Telehealth: Payer: Self-pay | Admitting: Pharmacist

## 2023-06-28 DIAGNOSIS — M810 Age-related osteoporosis without current pathological fracture: Secondary | ICD-10-CM

## 2023-06-28 NOTE — Telephone Encounter (Signed)
 PCP would like patient to set up appt with PharmD for osteoporosis Note GFR 25 (chronic kidney disease, follows with nephro) so therapy will likely be Prolia and usually high cost Patient can call insurance prior to appt to determine cost through medicare part B

## 2023-06-30 ENCOUNTER — Telehealth: Payer: Self-pay

## 2023-06-30 NOTE — Progress Notes (Signed)
 Care Guide Pharmacy Note  06/30/2023 Name: [name] MRN: [id number] DOB: 06-01-1950  Referred By: Eliodoro Guerin, DO Reason for referral: Complex Care Management (Outreach to schedule with Pharm d )   Amber Reeves is a 73 y.o. year old female who is a primary care patient of Eliodoro Guerin, DO.  [name] was referred to the pharmacist for assistance related to: osteoporosis  Successful contact was made with the patient to discuss pharmacy services including being ready for the pharmacist to call at least 5 minutes before the scheduled appointment time and to have medication bottles and any blood pressure readings ready for review. The patient agreed to meet with the pharmacist via telephone visit on (date/time).07/28/2023  Lenton Rail , RMA       [facility], [facility] Guide  Direct Dial: [phone]  Website: [email or web address]

## 2023-07-25 ENCOUNTER — Telehealth: Payer: Self-pay

## 2023-07-25 NOTE — Progress Notes (Signed)
 Care Guide Pharmacy Note  07/25/2023 Name: Amber Reeves MRN: [id number] DOB: March 01, 1950  Referred By: Eliodoro Guerin, DO Reason for referral: Complex Care Management (Pt called to cancel appt with Pharm d states that she does not wish to take medication )   Amber Reeves is a 73 y.o. year old female who is a primary care patient of Eliodoro Guerin, DO.  Amber Reeves was referred to the pharmacist for assistance related to: HLD and DMII  Successful contact was made with the patient to discuss pharmacy services.  Patient declines engagement at this time. Contact information was provided to the patient should they wish to reach out for assistance at a later time.  Lenton Rail , RMA     [facility]  [facility], [facility] Guide  Direct Dial: [phone]  Website: [email or web address]

## 2023-07-28 ENCOUNTER — Other Ambulatory Visit

## 2023-08-02 ENCOUNTER — Telehealth: Payer: Self-pay | Admitting: Pharmacist

## 2023-08-02 NOTE — Telephone Encounter (Signed)
 Called patient and notified her that PAP (Tresiba  and pen needles) have arrived and are ready for pick up.  Georga Killings, PharmD PGY-1 Pharmacy Resident

## 2023-09-01 ENCOUNTER — Other Ambulatory Visit: Payer: Self-pay | Admitting: Family Medicine

## 2023-09-01 DIAGNOSIS — I25119 Atherosclerotic heart disease of native coronary artery with unspecified angina pectoris: Secondary | ICD-10-CM

## 2023-09-01 DIAGNOSIS — I152 Hypertension secondary to endocrine disorders: Secondary | ICD-10-CM

## 2023-09-21 ENCOUNTER — Telehealth: Payer: Self-pay

## 2023-09-21 DIAGNOSIS — N189 Chronic kidney disease, unspecified: Secondary | ICD-10-CM | POA: Diagnosis not present

## 2023-09-21 DIAGNOSIS — E211 Secondary hyperparathyroidism, not elsewhere classified: Secondary | ICD-10-CM | POA: Diagnosis not present

## 2023-09-21 DIAGNOSIS — D631 Anemia in chronic kidney disease: Secondary | ICD-10-CM | POA: Diagnosis not present

## 2023-09-21 DIAGNOSIS — R809 Proteinuria, unspecified: Secondary | ICD-10-CM | POA: Diagnosis not present

## 2023-09-22 DIAGNOSIS — H5213 Myopia, bilateral: Secondary | ICD-10-CM | POA: Diagnosis not present

## 2023-09-27 DIAGNOSIS — Z01818 Encounter for other preprocedural examination: Secondary | ICD-10-CM | POA: Diagnosis not present

## 2023-09-27 DIAGNOSIS — H25811 Combined forms of age-related cataract, right eye: Secondary | ICD-10-CM | POA: Diagnosis not present

## 2023-10-09 DIAGNOSIS — E1129 Type 2 diabetes mellitus with other diabetic kidney complication: Secondary | ICD-10-CM | POA: Diagnosis not present

## 2023-10-09 DIAGNOSIS — R809 Proteinuria, unspecified: Secondary | ICD-10-CM | POA: Diagnosis not present

## 2023-10-09 DIAGNOSIS — N1832 Chronic kidney disease, stage 3b: Secondary | ICD-10-CM | POA: Diagnosis not present

## 2023-10-09 DIAGNOSIS — E876 Hypokalemia: Secondary | ICD-10-CM | POA: Diagnosis not present

## 2023-10-13 DIAGNOSIS — N189 Chronic kidney disease, unspecified: Secondary | ICD-10-CM | POA: Diagnosis not present

## 2023-10-13 DIAGNOSIS — E1122 Type 2 diabetes mellitus with diabetic chronic kidney disease: Secondary | ICD-10-CM | POA: Diagnosis not present

## 2023-10-13 DIAGNOSIS — H25811 Combined forms of age-related cataract, right eye: Secondary | ICD-10-CM | POA: Diagnosis not present

## 2023-10-13 DIAGNOSIS — E1136 Type 2 diabetes mellitus with diabetic cataract: Secondary | ICD-10-CM | POA: Diagnosis not present

## 2023-10-27 DIAGNOSIS — E1136 Type 2 diabetes mellitus with diabetic cataract: Secondary | ICD-10-CM | POA: Diagnosis not present

## 2023-10-27 DIAGNOSIS — N189 Chronic kidney disease, unspecified: Secondary | ICD-10-CM | POA: Diagnosis not present

## 2023-10-27 DIAGNOSIS — E1122 Type 2 diabetes mellitus with diabetic chronic kidney disease: Secondary | ICD-10-CM | POA: Diagnosis not present

## 2023-10-27 DIAGNOSIS — H2512 Age-related nuclear cataract, left eye: Secondary | ICD-10-CM | POA: Diagnosis not present

## 2023-10-27 DIAGNOSIS — H25812 Combined forms of age-related cataract, left eye: Secondary | ICD-10-CM | POA: Diagnosis not present

## 2023-11-03 DIAGNOSIS — H04123 Dry eye syndrome of bilateral lacrimal glands: Secondary | ICD-10-CM | POA: Diagnosis not present

## 2023-11-03 DIAGNOSIS — H524 Presbyopia: Secondary | ICD-10-CM | POA: Diagnosis not present

## 2023-11-23 ENCOUNTER — Ambulatory Visit

## 2023-11-23 VITALS — BP 136/73 | HR 70 | Ht 63.0 in | Wt 222.0 lb

## 2023-11-23 DIAGNOSIS — Z0000 Encounter for general adult medical examination without abnormal findings: Secondary | ICD-10-CM | POA: Diagnosis not present

## 2023-11-23 NOTE — Progress Notes (Signed)
 Subjective:   Amber Reeves is a 73 y.o. who presents for a Medicare Wellness preventive visit.  As a reminder, Annual Wellness Visits don't include a physical exam, and some assessments may be limited, especially if this visit is performed virtually. We may recommend an in-person follow-up visit with your provider if needed.  Visit Complete: Virtual I connected with  Amber Reeves on 11/23/23 by a audio enabled telemedicine application and verified that I am speaking with the correct person using two identifiers.  Patient Location: Home  Provider Location: Home Office  I discussed the limitations of evaluation and management by telemedicine. The patient expressed understanding and agreed to proceed.  Vital Signs: Because this visit was a virtual/telehealth visit, some criteria may be missing or patient reported. Any vitals not documented were not able to be obtained and vitals that have been documented are patient reported.  VideoDeclined- This patient declined Librarian, academic. Therefore the visit was completed with audio only.  Persons Participating in Visit: Patient.  AWV Questionnaire: No: Patient Medicare AWV questionnaire was not completed prior to this visit.  Cardiac Risk Factors include: advanced age (>10men, >15 women);diabetes mellitus;dyslipidemia;hypertension;obesity (BMI >30kg/m2)     Objective:    Today's Vitals   11/23/23 1423  BP: 136/73  Pulse: 70  Weight: 222 lb (100.7 kg)  Height: 5' 3 (1.6 m)   Body mass index is 39.33 kg/m.     11/23/2023   12:43 PM 08/01/2022    9:25 AM 05/13/2022    9:48 AM 12/29/2021    6:04 AM 11/17/2021    1:17 PM 10/21/2021   10:16 AM 07/20/2021    2:59 PM  Advanced Directives  Does Patient Have a Medical Advance Directive? No No No No No No No  Would patient like information on creating a medical advance directive?  No - Patient declined No - Patient declined  No - Patient declined No -  Patient declined No - Patient declined    Current Medications (verified) Outpatient Encounter Medications as of 11/23/2023  Medication Sig   albuterol  (VENTOLIN  HFA) 108 (90 Base) MCG/ACT inhaler TAKE 2 PUFFS BY MOUTH EVERY 4 HOURS AS NEEDED FOR WHEEZE   Alcohol Swabs (B-D SINGLE USE SWABS REGULAR) PADS Apply topically.   aspirin  EC 81 MG tablet Take 1 tablet (81 mg total) by mouth daily. Swallow whole.   atorvastatin  (LIPITOR) 80 MG tablet Take 1 tablet (80 mg total) by mouth daily.   Blood Glucose Calibration (ACCU-CHEK AVIVA) SOLN 2 Bottles by Other route as needed.   Budeson-Glycopyrrol-Formoterol  (BREZTRI  AEROSPHERE) 160-9-4.8 MCG/ACT AERO Inhale 2 puffs into the lungs 2 (two) times daily.   calcitRIOL (ROCALTROL) 0.25 MCG capsule Take 0.25 mcg by mouth 3 (three) times a week.   cholecalciferol (VITAMIN D3) 25 MCG (1000 UNIT) tablet Take 1,000 Units by mouth daily.   desloratadine  (CLARINEX ) 5 MG tablet Take 1 tablet (5 mg total) by mouth every other day. For allergies (renally dosed)   diphenhydrAMINE (BENADRYL) 25 MG tablet Take 50 mg by mouth daily as needed for allergies.   fluticasone  (FLONASE ) 50 MCG/ACT nasal spray Place 2 sprays into both nostrils daily.   glucose blood test strip TEST BLOOD SUGAR TWICE DAILY   hydrochlorothiazide (HYDRODIURIL) 25 MG tablet Take 25 mg by mouth daily.   insulin  degludec (TRESIBA  FLEXTOUCH) 200 UNIT/ML FlexTouch Pen Inject 40 Units into the skin daily.   Insulin  Syringe-Needle U-100 31G X 5/16 0.5 ML MISC Use to inject Insulin   four times daily: Use as directed; Dx: E11.9, E66.9   ipratropium-albuterol  (DUONEB) 0.5-2.5 (3) MG/3ML SOLN INHALE CONTENTS OF 1 VIAL VIA NEBULIZER EVERY 6 HOURS AS NEEDED FOR WHEEZING   lansoprazole (PREVACID) 15 MG capsule Take 30 mg by mouth at bedtime.   lisinopril  (ZESTRIL ) 5 MG tablet Take 1 tablet (5 mg total) by mouth daily.   metoprolol  tartrate (LOPRESSOR ) 25 MG tablet TAKE 1 TABLET BY MOUTH TWICE A DAY    montelukast  (SINGULAIR ) 10 MG tablet TAKE 1 TABLET BY MOUTH EVERYDAY AT BEDTIME   nitroGLYCERIN  (NITROSTAT ) 0.4 MG SL tablet Place 1 tablet (0.4 mg total) under the tongue every 5 (five) minutes x 3 doses as needed for chest pain (if no relief after 2nd dose, proceed to ED or call 911).   pioglitazone  (ACTOS ) 15 MG tablet Take 1 tablet (15 mg total) by mouth daily. Note dose change   No facility-administered encounter medications on file as of 11/23/2023.    Allergies (verified) Aspartame, Penicillins, Farxiga [dapagliflozin], Glucose, Metformin, Metformin and related, No healthtouch food allergies, Paxil [paroxetine hcl], Prozac [fluoxetine hcl], Zoloft [sertraline hcl], Prednisone , and Ranexa  [ranolazine ]   History: Past Medical History:  Diagnosis Date   Allergy    Anxiety    Arthritis    Asthma    Chronic kidney disease    Coronary artery disease    Depression    Diabetes mellitus without complication (HCC)    Dyspnea    GERD (gastroesophageal reflux disease)    Hyperlipidemia    Hypertension    Pneumonia    Past Surgical History:  Procedure Laterality Date   ABDOMINAL HYSTERECTOMY     BREAST EXCISIONAL BIOPSY     CESAREAN SECTION     3   CHOLECYSTECTOMY  2018   CORONARY STENT PLACEMENT  10/2017   [place]  [facility] hospital)   IR 3D INDEPENDENT WKST  11/26/2021   IR ANGIO INTRA EXTRACRAN SEL COM CAROTID INNOMINATE UNI R MOD SED  10/21/2021   IR ANGIO INTRA EXTRACRAN SEL COM CAROTID INNOMINATE UNI R MOD SED  05/13/2022   IR ANGIO INTRA EXTRACRAN SEL INTERNAL CAROTID UNI L MOD SED  10/21/2021   IR ANGIO INTRA EXTRACRAN SEL INTERNAL CAROTID UNI R MOD SED  11/26/2021   IR ANGIO VERTEBRAL SEL VERTEBRAL UNI L MOD SED  10/21/2021   IR ANGIOGRAM FOLLOW UP STUDY  11/26/2021   IR ANGIOGRAM FOLLOW UP STUDY  11/26/2021   IR ANGIOGRAM FOLLOW UP STUDY  11/26/2021   IR TRANSCATH/EMBOLIZ  11/26/2021   LEFT HEART CATH AND CORONARY ANGIOGRAPHY N/A 02/25/2019   Procedure: LEFT HEART CATH  AND CORONARY ANGIOGRAPHY;  Surgeon: Mady Bruckner, MD;  Location: [facility] INVASIVE CV LAB;  Service: Cardiovascular;  Laterality: N/A;   RADIOLOGY WITH ANESTHESIA N/A 11/26/2021   Procedure: 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;  Surgeon: Lanis Pupa, MD;  Location: [facility] OR;  Service: Radiology;  Laterality: N/A;   REPLACEMENT TOTAL KNEE BILATERAL Bilateral    done in [place]  (2003/2005)   RIGHT/LEFT HEART CATH AND CORONARY ANGIOGRAPHY N/A 09/22/2020   Procedure: RIGHT/LEFT HEART CATH AND CORONARY ANGIOGRAPHY;  Surgeon: Anner Alm ORN, MD;  Location: [facility] INV[facility] CV LAB;  Service: Cardiovascular;  Laterality: N/A;   TUBAL LIGATION     Family History  Problem Relation Age of Onset   Heart disease Mother    Stroke Mother    Hypertension Mother    Cancer Father    Stroke Father    Social History  Socioeconomic History   Marital status: Married    Spouse name: Ubaldo   Number of children: 3   Years of education: Not on file   Highest education level: 12th grade  Occupational History   Occupation: retired  Tobacco Use   Smoking status: Never   Smokeless tobacco: Never  Vaping Use   Vaping status: Never Used  Substance and Sexual Activity   Alcohol use: Never   Drug use: Never   Sexual activity: Not Currently  Other Topics Concern   Not on file  Social History Narrative   Resides at home with her husband.  She is originally from [place]  but relocated to [place]  and then to [place]  in August 2020.  She has a son who lives about 2 hours away in [place] and a sister-in-law who lives in [place].   2 children in [place]    Social Drivers of Health   Financial Resource Strain: Low Risk  (11/23/2023)   Overall Financial Resource Strain (CARDIA)    Difficulty of Paying Living Expenses: Not very hard  Food Insecurity: No Food Insecurity (11/23/2023)   Hunger Vital Sign    Worried About Running Out of Food in the Last Year: Never true    Ran Out of Food  in the Last Year: Never true  Transportation Needs: No Transportation Needs (11/23/2023)   PRAPARE - Administrator, Civil Service (Medical): No    Lack of Transportation (Non-Medical): No  Physical Activity: Insufficiently Active (11/23/2023)   Exercise Vital Sign    Days of Exercise per Week: 2 days    Minutes of Exercise per Session: 20 min  Stress: Stress Concern Present (11/23/2023)   [employer] of Occupational Health - Occupational Stress Questionnaire    Feeling of Stress: To some extent  Social Connections: Moderately Isolated (11/23/2023)   Social Connection and Isolation Panel    Frequency of Communication with Friends and Family: More than three times a week    Frequency of Social Gatherings with Friends and Family: More than three times a week    Attends Religious Services: Never    Database administrator or Organizations: No    Attends Engineer, structural: Never    Marital Status: Married    Tobacco Counseling Counseling given: Yes    Clinical Intake:  Pre-visit preparation completed: Yes  Pain : No/denies pain     BMI - recorded: 39.33 Nutritional Status: BMI > 30  Obese Nutritional Risks: None Diabetes: Yes  Lab Results  Component Value Date   HGBA1C 6.2 (H) 06/19/2023   HGBA1C 5.9 (H) 12/21/2022   HGBA1C 5.7 (H) 06/29/2022     How often do you need to have someone help you when you read instructions, pamphlets, or other written materials from your doctor or pharmacy?: 1 - Never  Interpreter Needed?: No  Comments: during pt's awv, pt c/o about having a hard w/her dizzieness Information entered by :: alia t/cma   Activities of Daily Living     11/23/2023   12:41 PM  In your present state of health, do you have any difficulty performing the following activities:  Hearing? 1  Vision? 0  Difficulty concentrating or making decisions? 0  Walking or climbing stairs? 1  Dressing or bathing? 0  Doing errands, shopping? 1   Comment pt's husband  Quarry manager and eating ? N  Using the Toilet? N  In the past six months, have you accidently leaked urine? N  Do you  have problems with loss of bowel control? N  Managing your Medications? N  Managing your Finances? N  Housekeeping or managing your Housekeeping? N    Patient Care Team: Jolinda Norene HERO, DO as PCP - General (Family Medicine) Alvan Dorn FALCON, MD as PCP - Cardiology (Cardiology) Billee Mliss BIRCH, [facility] as Pharmacist (Family Medicine) Lanis Pupa, MD as Consulting Physician (Neurosurgery) Carlie Clark, MD as Consulting Physician (Otolaryngology) Hope Almarie ORN, NP as Nurse Practitioner (Pulmonary Disease)  I have updated your Care Teams any recent Medical Services you may have received from other providers in the past year.     Assessment:   This is a routine wellness examination for [place].  Hearing/Vision screen Hearing Screening - Comments:: Pt use hearing aid Vision Screening - Comments:: Pt wear glasses/pt goes [facility] in [place], [place]/last ov 2025   Goals Addressed             This Visit's Progress    DIET - INCREASE WATER INTAKE   On track    Increase to 6-8 glasses daily     Patient Stated       Wants to find a better way to control her dizzieness       Depression Screen     11/23/2023   12:44 PM 06/19/2023    9:25 AM 12/21/2022    1:31 PM 08/01/2022    9:24 AM 06/29/2022    1:05 PM 02/11/2022    9:09 AM 01/07/2022    9:10 AM  PHQ 2/9 Scores  PHQ - 2 Score 0 0 0 0 2 5 4   PHQ- 9 Score  9 0 0 7 14 8     Fall Risk       11/23/2023   12:36 PM 06/19/2023    9:26 AM 12/21/2022    9:21 AM 08/01/2022    9:22 AM 02/11/2022    9:09 AM  Fall Risk   Falls in the past year? 0 0 0 0 0  Number falls in past yr: 0 0 0 0   Injury with Fall? 0 0 0 0   Risk for fall due to : No Fall Risks No Fall Risks  No Fall Risks   Follow up Falls evaluation completed Falls evaluation completed  Falls prevention discussed      MEDICARE RISK AT HOME:   Medicare Risk at Home Any stairs in or around the home?: Yes If so, are there any without handrails?: Yes Home free of loose throw rugs in walkways, pet beds, electrical cords, etc?: Yes Adequate lighting in your home to reduce risk of falls?: Yes Life alert?: No Use of a cane, walker or w/c?: No Grab bars in the bathroom?: Yes Shower chair or bench in shower?: Yes Elevated toilet seat or a handicapped toilet?: No  TIMED UP AND GO:  Was the test performed?  no  Cognitive Function: pt declined        11/23/2023   12:47 PM 08/01/2022    9:26 AM 07/20/2021    3:03 PM 07/16/2020    4:12 PM  6CIT Screen  What Year? 0 points 0 points 0 points 0 points  What month? 0 points 0 points 0 points 0 points  What time? 0 points 0 points 0 points 0 points  Count back from 20 0 points 0 points 0 points 0 points  Months in reverse 4 points 0 points 0 points 0 points  Repeat phrase 6 points 0 points 0 points 0 points  Total Score  10 points 0 points 0 points 0 points    Immunizations Immunization History  Administered Date(s) Administered   INFLUENZA, HIGH DOSE SEASONAL PF 02/16/2017   Influenza,inj,Quad PF,6+ Mos 12/18/2013   Moderna Sars-Covid-2 Vaccination 05/01/2019, 05/29/2019, 02/18/2020    Screening Tests Health Maintenance  Topic Date Due   Zoster Vaccines- Shingrix (1 of 2) Never done   OPHTHALMOLOGY EXAM  08/25/2021   Mammogram  09/19/2021   Diabetic kidney evaluation - Urine ACR  06/29/2023   FOOT EXAM  06/29/2023   Influenza Vaccine  09/22/2023   COVID-19 Vaccine (4 - 2025-26 season) 10/23/2023   Pneumococcal Vaccine: 50+ Years (1 of 2 - PCV) 12/21/2023 (Originally 12/17/1969)   Colonoscopy  12/21/2023 (Originally 12/18/1995)   DTaP/Tdap/Td (1 - Tdap) 06/18/2024 (Originally 12/17/1969)   HEMOGLOBIN A1C  12/19/2023   Diabetic kidney evaluation - eGFR measurement  04/11/2024   Medicare Annual Wellness (AWV)  11/22/2024   DEXA SCAN   06/21/2025   Hepatitis C Screening  Completed   HPV VACCINES  Aged Out   Meningococcal B Vaccine  Aged Out    Health Maintenance Items Addressed: See Nurse Notes at the end of this note  Additional Screening:  Vision Screening: Recommended annual ophthalmology exams for early detection of glaucoma and other disorders of the eye. Is the patient up to date with their annual eye exam?  Yes  Who is the provider or what is the name of the office in which the patient attends annual eye exams? [facility]  Dental Screening: Recommended annual dental exams for proper oral hygiene  Community Resource Referral / Chronic Care Management: CRR required this visit?  No   CCM required this visit?  No   Plan:    I have personally reviewed and noted the following in the patient's chart:   Medical and social history Use of alcohol, tobacco or illicit drugs  Current medications and supplements including opioid prescriptions. Patient is not currently taking opioid prescriptions. Functional ability and status Nutritional status Physical activity Advanced directives List of other physicians Hospitalizations, surgeries, and ER visits in previous 12 months Vitals Screenings to include cognitive, depression, and falls Referrals and appointments  In addition, I have reviewed and discussed with patient certain preventive protocols, quality metrics, and best practice recommendations. A written personalized care plan for preventive services as well as general preventive health recommendations were provided to patient.   Ozie Ned, CMA   11/23/2023   After Visit Summary: (MyChart) Due to this being a telephonic visit, the after visit summary with patients personalized plan was offered to patient via MyChart   Notes: PCP Follow Up Recommendations: Pt is aware and due UrineACR, foot exam, mammogram-declined, diabetic eye exam, shingles/flu vaccine.

## 2023-11-23 NOTE — Patient Instructions (Signed)
 Amber Reeves,  Thank you for taking the time for your Medicare Wellness Visit. I appreciate your continued commitment to your health goals. Please review the care plan we discussed, and feel free to reach out if I can assist you further.  Medicare recommends these wellness visits once per year to help you and your care team stay ahead of potential health issues. These visits are designed to focus on prevention, allowing your provider to concentrate on managing your acute and chronic conditions during your regular appointments.  Please note that Annual Wellness Visits do not include a physical exam. Some assessments may be limited, especially if the visit was conducted virtually. If needed, we may recommend a separate in-person follow-up with your provider.  Ongoing Care Seeing your primary care provider every 3 to 6 months helps us  monitor your health and provide consistent, personalized care.   Referrals If a referral was made during today's visit and you haven't received any updates within two weeks, please contact the referred provider directly to check on the status.  Recommended Screenings:  Health Maintenance  Topic Date Due   Zoster (Shingles) Vaccine (1 of 2) Never done   Eye exam for diabetics  08/25/2021   Breast Cancer Screening  09/19/2021   Yearly kidney health urinalysis for diabetes  06/29/2023   Complete foot exam   06/29/2023   Medicare Annual Wellness Visit  08/01/2023   Flu Shot  09/22/2023   COVID-19 Vaccine (4 - 2025-26 season) 10/23/2023   Pneumococcal Vaccine for age over 51 (1 of 2 - PCV) 12/21/2023*   Colon Cancer Screening  12/21/2023*   DTaP/Tdap/Td vaccine (1 - Tdap) 06/18/2024*   Hemoglobin A1C  12/19/2023   Yearly kidney function blood Reeves for diabetes  04/11/2024   DEXA scan (bone density measurement)  06/21/2025   Hepatitis C Screening  Completed   HPV Vaccine  Aged Out   Meningitis B Vaccine  Aged Out  *Topic was postponed. The date shown is not the  original due date.       11/23/2023   12:43 PM  Advanced Directives  Does Patient Have a Medical Advance Directive? No   Advance Care Planning is important because it: Ensures you receive medical care that aligns with your values, goals, and preferences. Provides guidance to your family and loved ones, reducing the emotional burden of decision-making during critical moments.  Vision: Annual vision screenings are recommended for early detection of glaucoma, cataracts, and diabetic retinopathy. These exams can also reveal signs of chronic conditions such as diabetes and high blood pressure.  Dental: Annual dental screenings help detect early signs of oral cancer, gum disease, and other conditions linked to overall health, including heart disease and diabetes.  Please see the attached documents for additional preventive care recommendations.

## 2023-11-30 ENCOUNTER — Telehealth: Payer: Self-pay

## 2023-11-30 NOTE — Telephone Encounter (Signed)
-----   Message from [facility] sent at 11/24/2023  7:22 AM EDT ----- She is under the care of a neurologist for this.  I highly recommend she follow up with them if it is getting worse all of the sudden.  Her spring visit had borderline BPs, so not sure if dc'ing lisinopril  is in her best interest.  Hopefully, she is keeping a close eye on Bps at home.  Goal <150/90. ----- Message ----- From: Debby Simper, CMA Sent: 11/23/2023   2:31 PM EDT To: Norene CHRISTELLA Fielding, DO; Rock CHRISTELLA Bruns, FNP  During pt's awv today, pt is still having a hard time with her dizzieness? Pt also stated that she has also stop taking her lisnopril, to see if this will help with her dizzieness.  Pls advise Alia t/cma

## 2023-11-30 NOTE — Telephone Encounter (Signed)
 Called spoke w/pt relay pcp's msg/pt voice understanding, per pt it was the kidney dr suggest to take half of 20mg  down to 10mg  lisnopril for dizzieness. Per pt it seems to help  w/dizzieness. Afterwards, the cardiologist suggest to take it down to 5mg , however pt have not been taking it for 2mos.   Currently pt is still not taking the lisnopril since last awv, pt is aware of pcp's msg.

## 2023-12-14 ENCOUNTER — Other Ambulatory Visit (HOSPITAL_COMMUNITY): Payer: Self-pay

## 2023-12-14 ENCOUNTER — Telehealth: Payer: Self-pay

## 2023-12-14 NOTE — Telephone Encounter (Addendum)
 Rec'd income docs for patients PAP re-enrollment with [employer] (for Tresiba ) and [employer] (Breztri ).   Household size 2 Monthly gross income $3751  FPL 212%  Above 150% FPL - MIGHT qualify for re-enrollment. Will attempt.

## 2023-12-14 NOTE — Telephone Encounter (Signed)
 PAP: Re-enrollment application for Tresiba  has been submitted to [employer], via fax

## 2023-12-14 NOTE — Telephone Encounter (Signed)
 PAP: Application for Breztri  has been submitted to [employer] ([employer]), via fax  (Re-enrollment)  Please send new 90 day RX with refills to [facility] pharmacy for re-enrollment. Thanks!

## 2023-12-14 NOTE — Telephone Encounter (Signed)
 Patient coming by office to sign both applications.

## 2023-12-20 ENCOUNTER — Encounter (INDEPENDENT_AMBULATORY_CARE_PROVIDER_SITE_OTHER): Payer: Self-pay | Admitting: *Deleted

## 2023-12-21 ENCOUNTER — Ambulatory Visit: Attending: Student | Admitting: Student

## 2023-12-21 ENCOUNTER — Encounter: Payer: Self-pay | Admitting: Student

## 2023-12-21 VITALS — BP 132/80 | HR 54 | Ht 63.0 in | Wt 221.8 lb

## 2023-12-21 DIAGNOSIS — I5032 Chronic diastolic (congestive) heart failure: Secondary | ICD-10-CM | POA: Diagnosis not present

## 2023-12-21 DIAGNOSIS — I10 Essential (primary) hypertension: Secondary | ICD-10-CM

## 2023-12-21 DIAGNOSIS — E785 Hyperlipidemia, unspecified: Secondary | ICD-10-CM | POA: Diagnosis not present

## 2023-12-21 DIAGNOSIS — R079 Chest pain, unspecified: Secondary | ICD-10-CM

## 2023-12-21 DIAGNOSIS — I25118 Atherosclerotic heart disease of native coronary artery with other forms of angina pectoris: Secondary | ICD-10-CM

## 2023-12-21 MED ORDER — ISOSORBIDE MONONITRATE ER 30 MG PO TB24
30.0000 mg | ORAL_TABLET | Freq: Every day | ORAL | 3 refills | Status: AC
Start: 2023-12-21 — End: 2024-03-20

## 2023-12-21 NOTE — Patient Instructions (Signed)
 Medication Instructions:   Start Imdur  15 mg ( 1/2 Tablet ) Daily for 2 weeks, then increase to 30 mg Daily   *If you need a refill on your cardiac medications before your next appointment, please call your pharmacy*  Lab Work: NONE   If you have labs (blood work) drawn today and your tests are completely normal, you will receive your results only by: MyChart Message (if you have MyChart) OR A paper copy in the mail If you have any lab test that is abnormal or we need to change your treatment, we will call you to review the results.  Testing/Procedures: NONE   Follow-Up: At [facility], you and your health needs are our priority.  As part of our continuing mission to provide you with exceptional heart care, our providers are all part of one team.  This team includes your primary Cardiologist (physician) and Advanced Practice Providers or APPs (Physician Assistants and Nurse Practitioners) who all work together to provide you with the care you need, when you need it.  Your next appointment:   3 -4 month(s)  Provider:   Dorn Ross, MD    We recommend signing up for the patient portal called MyChart.  Sign up information is provided on this After Visit Summary.  MyChart is used to connect with patients for Virtual Visits (Telemedicine).  Patients are able to view lab/test results, encounter notes, upcoming appointments, etc.  Non-urgent messages can be sent to your provider as well.   To learn more about what you can do with MyChart, go to [email or web address].   Other Instructions Thank you for choosing [facility]!

## 2023-12-21 NOTE — Progress Notes (Signed)
 Cardiology Office Note    Date:  12/21/2023  ID:  Amber Reeves, Amber Reeves [birth year], MRN [id number] Cardiologist: Alvan Carrier, MD { : History of Present Illness:    Amber Reeves is a 73 y.o. female with past medical history of CAD (s/p DES to LAD in 11/2017, cardiac catheterization in 09/2020 showing patent LAD stent and several tiny diagonals which were jailed by prior stent and too small for intervention with medical management recommended), chronic HFpEF, HTN, HLD, OSA (intolerant to CPAP and repeat sleep study in 2024 showed mild OSA), cerebral aneurysm (s/p stent and coiling in 11/2021) and brief SVT who presents to the office today for 50-month follow-up.  She was last examined by myself in 02/2023 and reported having episodic dizziness for many years. Had recently been noted to have hypotension and Lisinopril  dosing was reduced. She reported baseline dyspnea on exertion along with intermittent episodes of chest discomfort but symptoms did not resemble her prior angina. She had self discontinued all of her medications within the past year and was gradually adding these back as she was trying to rule out causes of her dizziness. She was still taking Atorvastatin , Lopressor , HCTZ and Lisinopril  but had discontinued ASA and Imdur . She was encouraged to restart ASA 81 mg daily.  In talking with the patient today, she reports having more episodic chest pain over the past several months. Symptoms typically occur at rest and she reports occasional shooting pains which last for a few seconds and then generalized discomfort which radiates into her right shoulder and down her right arm. This can last for several hours at a time and feels like an aching sensation. She still reports having dizziness which has been occurring for over 2 years since prior aneurysm treatment. Has been evaluated by ENT and PT with no improvement. As discussed at prior office visits, she has temporarily held almost all  of her medications within the past year but no improvement in symptoms. She did stop taking Lisinopril  approximately a month ago.  She has baseline dyspnea on exertion but denies any specific orthopnea or PND. Has intermittent lower extremity edema in the setting of varicose veins.  Studies Reviewed:   EKG: EKG is ordered today and demonstrates:   EKG Interpretation Date/Time:  Thursday December 21 2023 14:20:54 EDT Ventricular Rate:  56 PR Interval:  178 QRS Duration:  90 QT Interval:  456 QTC Calculation: 440 R Axis:   -21  Text Interpretation: Sinus bradycardia Left axis deviation No acute ST changes. Confirmed by Johnson Grate ([id number]) on 12/21/2023 2:54:55 PM       R/LHC: 09/2020   Prox LAD lesion is 15% stenosed.   Prox LAD to Mid LAD Long stented segment is widely patent -several very small diagonal branches are somewhat jailed, but less than 1 mm diameter.   Prox Cx lesion is 15% stenosed.   Ost RCA lesion is 20% stenosed.   LV end diastolic pressure is mildly elevated.   No evidence of Pulmonary Hypertension: PAP mean 21 mmHg with LVEDP and PCWP of 18 mmHg.   SUMMARY Stable single-vessel CAD with widely patent LAD stent.  LAD tapers distally to a very small caliber vessel with a small diagonal branch.  Several tiny diagonal branches are jailed by the stent, but no options for PCI.   Stable from there is catheterization in January 2021. Otherwise normal large-caliber LCx and RCA. Mild to moderately elevated LVEDP and PCWP with normal Right Heart Cath Pressures-no evidence of Pulmonary  Hypertension. Systemic Hypertension Cardiac Output and Index (Fick) 8.37-4.08.   PVR 0.6-1.22 Amber Reeves   Consider microvascular disease, hypertensive heart disease as other potential etiologies from a cardiac standpoint for chest pain versus noncardiac chest pain.   Consider calcium  channel blocker and beta-blocker titration as opposed to long-acting nitrate.   Event Monitor:  08/2021 Addendum: Over read. Agree with autoread.  Avoid caffeine. Hydrate well.  Given frequency of SVT, will cc to her cardiologist for any additional recommendations, route to Dr Alvan.     Patch Wear Time:  9 days and 17 hours (2023-07-10T13:32:00-0400 to 2023-07-20T06:44:39-0400)   Patient had a min HR of 46 bpm, max HR of 179 bpm, and avg HR of 58 bpm. Predominant underlying rhythm was Sinus Rhythm. 1 run of Ventricular Tachycardia occurred lasting 4 beats with a max rate of 169 bpm (avg 156 bpm). 31 Supraventricular Tachycardia  runs occurred, the run with the fastest interval lasting 11 beats with a max rate of 179 bpm, the longest lasting 15.1 secs with an avg rate of 107 bpm. Isolated SVEs were occasional (2.5%, 16848), SVE Couplets were rare (<1.0%, 1094), and SVE Triplets  were rare (<1.0%, 59). Isolated VEs were rare (<1.0%, 685), VE Couplets were rare (<1.0%, 1), and VE Triplets were rare (<1.0%, 1). Ventricular Bigeminy was present.   Physical Exam:   VS:  BP 132/80 (BP Location: Left Wrist, Patient Position: Sitting, Cuff Size: Normal)   Pulse (!) 54   Ht 5' 3 (1.6 m)   Wt 221 lb 12.8 oz (100.6 kg)   SpO2 96%   BMI 39.29 kg/m    Wt Readings from Last 3 Encounters:  12/21/23 221 lb 12.8 oz (100.6 kg)  11/23/23 222 lb (100.7 kg)  06/19/23 222 lb (100.7 kg)     GEN: Well nourished, well developed female appearing in no acute distress NECK: No JVD; No carotid bruits CARDIAC: RRR, no murmurs, rubs, gallops RESPIRATORY:  Clear to auscultation without rales, wheezing or rhonchi  ABDOMEN: Appears non-distended. No obvious abdominal masses. EXTREMITIES: No clubbing or cyanosis. No pitting edema.  Distal pedal pulses are 2+ bilaterally.  Varicose veins noted   Assessment and Plan:   1. Coronary artery disease involving native coronary artery of native heart with other form of angina pectoris - She previously underwent DES placement to the LAD in 2019 and most recent  cardiac catheterization in 09/2020 showed a patent stent with several tiny diagonal branch vessels which were jailed by her prior stent and no options for PCI.  - She reports having episodic chest pain as outlined above and symptoms are atypical as they can be a sharp pain lasting for seconds or an aching sensation lasting for hours and not associated with exertion.  She was previously on Imdur  and tolerated well but had intolerances to Ranexa . Reviewed options with the patient and will plan to restart Imdur  at 15 mg daily and she can titrate to 30 mg daily after 2 weeks. If symptoms persist, can consider repeat ischemic evaluation but as discussed above, she did not have any options for PCI by catheterization in 2022 and symptoms were felt to be due to small vessel disease.   - Continue ASA 81 mg daily, Atorvastatin  80 mg daily and Lopressor  25 mg twice daily.  2. Chronic heart failure with preserved ejection fraction (HFpEF) (HCC) - Echocardiogram in 2022 showed a preserved EF of 65 to 70% with mild LVH and normal RV function. She denies any acute changes in her  respiratory status and appears euvolemic by examination today. She has varicose veins along her legs but no pitting edema. - Remains on HCTZ 25 mg daily. Followed by Nephrology and creatinine was at 1.3 when checked in 08/2023.  3. Hyperlipidemia LDL goal <55 - FLP in 05/2023 showed total cholesterol 107, triglycerides 108, HDL 32 and LDL 55. Continue current medical therapy with Atorvastatin  80 mg daily.    4. HTN - BP is at 132/80 during today's visit. Lisinopril  was previously reduced by Nephrology and she has since stopped the medication. Remains on HCTZ 25 mg daily and Lopressor  25 mg twice daily for now.  Signed, Laymon CHRISTELLA Qua, PA-C

## 2023-12-22 NOTE — Telephone Encounter (Signed)
 SABRA

## 2023-12-31 ENCOUNTER — Other Ambulatory Visit: Payer: Self-pay | Admitting: *Deleted

## 2024-01-03 DIAGNOSIS — F319 Bipolar disorder, unspecified: Secondary | ICD-10-CM | POA: Diagnosis not present

## 2024-01-03 DIAGNOSIS — Z6839 Body mass index (BMI) 39.0-39.9, adult: Secondary | ICD-10-CM | POA: Diagnosis not present

## 2024-01-03 DIAGNOSIS — I10 Essential (primary) hypertension: Secondary | ICD-10-CM | POA: Diagnosis not present

## 2024-01-03 DIAGNOSIS — Z809 Family history of malignant neoplasm, unspecified: Secondary | ICD-10-CM | POA: Diagnosis not present

## 2024-01-03 DIAGNOSIS — J4550 Severe persistent asthma, uncomplicated: Secondary | ICD-10-CM | POA: Diagnosis not present

## 2024-01-03 DIAGNOSIS — Z7984 Long term (current) use of oral hypoglycemic drugs: Secondary | ICD-10-CM | POA: Diagnosis not present

## 2024-01-03 DIAGNOSIS — Z833 Family history of diabetes mellitus: Secondary | ICD-10-CM | POA: Diagnosis not present

## 2024-01-03 DIAGNOSIS — M1990 Unspecified osteoarthritis, unspecified site: Secondary | ICD-10-CM | POA: Diagnosis not present

## 2024-01-03 DIAGNOSIS — I25119 Atherosclerotic heart disease of native coronary artery with unspecified angina pectoris: Secondary | ICD-10-CM | POA: Diagnosis not present

## 2024-01-03 DIAGNOSIS — Z882 Allergy status to sulfonamides status: Secondary | ICD-10-CM | POA: Diagnosis not present

## 2024-01-03 DIAGNOSIS — K219 Gastro-esophageal reflux disease without esophagitis: Secondary | ICD-10-CM | POA: Diagnosis not present

## 2024-01-03 DIAGNOSIS — Z79899 Other long term (current) drug therapy: Secondary | ICD-10-CM | POA: Diagnosis not present

## 2024-01-03 DIAGNOSIS — J4489 Other specified chronic obstructive pulmonary disease: Secondary | ICD-10-CM | POA: Diagnosis not present

## 2024-01-03 DIAGNOSIS — E119 Type 2 diabetes mellitus without complications: Secondary | ICD-10-CM | POA: Diagnosis not present

## 2024-01-03 DIAGNOSIS — M810 Age-related osteoporosis without current pathological fracture: Secondary | ICD-10-CM | POA: Diagnosis not present

## 2024-01-03 DIAGNOSIS — I499 Cardiac arrhythmia, unspecified: Secondary | ICD-10-CM | POA: Diagnosis not present

## 2024-01-03 DIAGNOSIS — F419 Anxiety disorder, unspecified: Secondary | ICD-10-CM | POA: Diagnosis not present

## 2024-01-03 DIAGNOSIS — E876 Hypokalemia: Secondary | ICD-10-CM | POA: Diagnosis not present

## 2024-01-03 DIAGNOSIS — Z7982 Long term (current) use of aspirin: Secondary | ICD-10-CM | POA: Diagnosis not present

## 2024-01-03 DIAGNOSIS — G4733 Obstructive sleep apnea (adult) (pediatric): Secondary | ICD-10-CM | POA: Diagnosis not present

## 2024-01-03 DIAGNOSIS — Z9989 Dependence on other enabling machines and devices: Secondary | ICD-10-CM | POA: Diagnosis not present

## 2024-01-03 DIAGNOSIS — E785 Hyperlipidemia, unspecified: Secondary | ICD-10-CM | POA: Diagnosis not present

## 2024-01-03 DIAGNOSIS — Z7951 Long term (current) use of inhaled steroids: Secondary | ICD-10-CM | POA: Diagnosis not present

## 2024-01-08 DIAGNOSIS — N189 Chronic kidney disease, unspecified: Secondary | ICD-10-CM | POA: Diagnosis not present

## 2024-01-08 DIAGNOSIS — D631 Anemia in chronic kidney disease: Secondary | ICD-10-CM | POA: Diagnosis not present

## 2024-01-08 DIAGNOSIS — R809 Proteinuria, unspecified: Secondary | ICD-10-CM | POA: Diagnosis not present

## 2024-01-12 ENCOUNTER — Ambulatory Visit: Payer: Self-pay

## 2024-01-12 ENCOUNTER — Encounter: Payer: Self-pay | Admitting: Family Medicine

## 2024-01-12 ENCOUNTER — Ambulatory Visit: Payer: Self-pay | Admitting: Family Medicine

## 2024-01-12 ENCOUNTER — Ambulatory Visit (INDEPENDENT_AMBULATORY_CARE_PROVIDER_SITE_OTHER)

## 2024-01-12 VITALS — BP 144/71 | HR 54 | Temp 98.6°F | Ht 63.0 in | Wt 223.6 lb

## 2024-01-12 DIAGNOSIS — R079 Chest pain, unspecified: Secondary | ICD-10-CM | POA: Diagnosis not present

## 2024-01-12 DIAGNOSIS — E1169 Type 2 diabetes mellitus with other specified complication: Secondary | ICD-10-CM | POA: Diagnosis not present

## 2024-01-12 DIAGNOSIS — M810 Age-related osteoporosis without current pathological fracture: Secondary | ICD-10-CM

## 2024-01-12 DIAGNOSIS — E785 Hyperlipidemia, unspecified: Secondary | ICD-10-CM | POA: Diagnosis not present

## 2024-01-12 DIAGNOSIS — F329 Major depressive disorder, single episode, unspecified: Secondary | ICD-10-CM

## 2024-01-12 DIAGNOSIS — E1159 Type 2 diabetes mellitus with other circulatory complications: Secondary | ICD-10-CM

## 2024-01-12 DIAGNOSIS — J4550 Severe persistent asthma, uncomplicated: Secondary | ICD-10-CM | POA: Diagnosis not present

## 2024-01-12 DIAGNOSIS — Z0001 Encounter for general adult medical examination with abnormal findings: Secondary | ICD-10-CM | POA: Diagnosis not present

## 2024-01-12 DIAGNOSIS — I152 Hypertension secondary to endocrine disorders: Secondary | ICD-10-CM | POA: Diagnosis not present

## 2024-01-12 DIAGNOSIS — R7989 Other specified abnormal findings of blood chemistry: Secondary | ICD-10-CM | POA: Diagnosis not present

## 2024-01-12 DIAGNOSIS — N1832 Chronic kidney disease, stage 3b: Secondary | ICD-10-CM | POA: Diagnosis not present

## 2024-01-12 DIAGNOSIS — R0789 Other chest pain: Secondary | ICD-10-CM | POA: Diagnosis not present

## 2024-01-12 DIAGNOSIS — H269 Unspecified cataract: Secondary | ICD-10-CM | POA: Insufficient documentation

## 2024-01-12 DIAGNOSIS — R0602 Shortness of breath: Secondary | ICD-10-CM | POA: Diagnosis not present

## 2024-01-12 DIAGNOSIS — Z0000 Encounter for general adult medical examination without abnormal findings: Secondary | ICD-10-CM

## 2024-01-12 LAB — BAYER DCA HB A1C WAIVED: HB A1C (BAYER DCA - WAIVED): 6.6 % — ABNORMAL HIGH (ref 4.8–5.6)

## 2024-01-12 MED ORDER — PIOGLITAZONE HCL 15 MG PO TABS: 15.0000 mg | ORAL_TABLET | Freq: Every day | ORAL | 3 refills | Status: AC

## 2024-01-12 MED ORDER — ATORVASTATIN CALCIUM 80 MG PO TABS: 80.0000 mg | ORAL_TABLET | Freq: Every day | ORAL | 3 refills | Status: AC

## 2024-01-12 MED ORDER — METOPROLOL TARTRATE 25 MG PO TABS
25.0000 mg | ORAL_TABLET | Freq: Two times a day (BID) | ORAL | 3 refills | Status: AC
Start: 2024-01-12 — End: ?

## 2024-01-12 NOTE — Telephone Encounter (Signed)
 PAP: Patient assistance application for Breztri  has been approved by PAP Companies: [employer] from 02/22/24 to 02/20/25.   Medication should be delivered to: Home.   For further shipping updates, please contact [employer] ([employer]) at [phone].   Patient ID is: [id number]

## 2024-01-12 NOTE — Telephone Encounter (Signed)
 FYI Only or Action Required?: FYI only for provider: Pt made aware of results .  Patient was last seen in primary care on 01/12/2024 by Jolinda Norene HERO, DO.  Called Nurse Triage reporting Results.  .  Triage Disposition: Information or Advice Only Call  Patient/caregiver understands and will follow disposition?: Yes    No CRM created pt calling regarding results, she missed a phone call from the office.    Reason for Disposition  Health information question, no triage required and triager able to answer question  Answer Assessment - Initial Assessment Questions This RN read verbatim the results written by her PCP Please inform chest x-ray is negative for any evidence of cardiac or lung processes causing shortness of breath, A1c up slightly but still technically controlled.  Watch diet. Pt verbalized understanding and denied having any questions at time of call.     1. REASON FOR CALL: What is the main reason for your call? or How can I best help you?     Results  2. OTHER QUESTIONS: Do you have any other questions?     Denied any additional questions  Protocols used: Information Only Call - No Triage-A-AH

## 2024-01-12 NOTE — Telephone Encounter (Signed)
 noted

## 2024-01-12 NOTE — Progress Notes (Signed)
 Amber Reeves is a 73 y.o. female presents to office today for annual physical exam examination.    She reports that she has been having increasing chest pain and shortness of breath from her baseline over the last several weeks.  She saw Dr. Alvan and they added Imdur  but she notes that this really has not made any big difference in her symptoms.  She apparently was exposed to an upper respiratory infection and wonders if maybe this could possibly cause symptoms though she denies any overt coughing, fevers etc.  Patient reports overall she feels like she has a poor quality of life and admits to some depressive symptoms but denies anxiety or depression impacting her chest pain or shortness of breath.  She has had multiple allergies to SSRIs and other interventions for anxiety and depression.  She reports varicose veins have been a little painful but nothing that she is ready to see vascular exam for.  She has impaired mobility secondary to chronic stiffness and of course cardiopulmonary symptoms as above.  She wears a hearing aid on the right  Occupation: retired, Marital status: married, Substance use: none Health Maintenance Due  Topic Date Due   Pneumococcal Vaccine: 50+ Years (1 of 2 - PCV) Never done   Colonoscopy  Never done   Zoster Vaccines- Shingrix (1 of 2) Never done   OPHTHALMOLOGY EXAM  08/25/2021   Mammogram  09/19/2021   Diabetic kidney evaluation - Urine ACR  06/29/2023   FOOT EXAM  06/29/2023   Influenza Vaccine  09/22/2023   COVID-19 Vaccine (4 - 2025-26 season) 10/23/2023   HEMOGLOBIN A1C  12/19/2023    Immunization History  Administered Date(s) Administered   INFLUENZA, HIGH DOSE SEASONAL PF 02/16/2017   Influenza,inj,Quad PF,6+ Mos 12/18/2013   Moderna Sars-Covid-2 Vaccination 05/01/2019, 05/29/2019, 02/18/2020   Past Medical History:  Diagnosis Date   Allergy    Anxiety    Arthritis    Asthma    Cataract    Chronic kidney disease    Coronary artery  disease    Depression    Diabetes mellitus without complication (HCC)    Dyspnea    GERD (gastroesophageal reflux disease)    Hyperlipidemia    Hypertension    Pneumonia    Social History   Socioeconomic History   Marital status: Married    Spouse name: Ubaldo   Number of children: 3   Years of education: Not on file   Highest education level: 12th grade  Occupational History   Occupation: retired  Tobacco Use   Smoking status: Never   Smokeless tobacco: Never  Vaping Use   Vaping status: Never Used  Substance and Sexual Activity   Alcohol use: Never   Drug use: Never   Sexual activity: Not Currently  Other Topics Concern   Not on file  Social History Narrative   Resides at home with her husband.  She is originally from [place]  but relocated to [place]  and then to [place]  in August 2020.  She has a son who lives about 2 hours away in [place] and a sister-in-law who lives in [place].   2 children in [place]    Social Drivers of Health   Financial Resource Strain: Medium Risk (01/11/2024)   Overall Financial Resource Strain (CARDIA)    Difficulty of Paying Living Expenses: Somewhat hard  Food Insecurity: Food Insecurity Present (01/11/2024)   Hunger Vital Sign    Worried About Running Out of Food in the Last Year: Sometimes  true    Ran Out of Food in the Last Year: Never true  Transportation Needs: No Transportation Needs (01/11/2024)   PRAPARE - Administrator, Civil Service (Medical): No    Lack of Transportation (Non-Medical): No  Physical Activity: Inactive (01/11/2024)   Exercise Vital Sign    Days of Exercise per Week: 0 days    Minutes of Exercise per Session: Not on file  Stress: Stress Concern Present (01/11/2024)   [employer] of Occupational Health - Occupational Stress Questionnaire    Feeling of Stress: To some extent  Social Connections: Socially Isolated (01/11/2024)   Social Connection and Isolation Panel    Frequency of  Communication with Friends and Family: Twice a week    Frequency of Social Gatherings with Friends and Family: Never    Attends Religious Services: Never    Database Administrator or Organizations: No    Attends Engineer, Structural: Not on file    Marital Status: Married  Catering Manager Violence: Not At Risk (11/23/2023)   Humiliation, Afraid, Rape, and Kick questionnaire    Fear of Current or Ex-Partner: No    Emotionally Abused: No    Physically Abused: No    Sexually Abused: No   Past Surgical History:  Procedure Laterality Date   ABDOMINAL HYSTERECTOMY     BREAST EXCISIONAL BIOPSY     CESAREAN SECTION     3   CHOLECYSTECTOMY  2018   CORONARY STENT PLACEMENT  10/2017   [place]  [facility] hospital)   IR 3D INDEPENDENT WKST  11/26/2021   IR ANGIO INTRA EXTRACRAN SEL COM CAROTID INNOMINATE UNI R MOD SED  10/21/2021   IR ANGIO INTRA EXTRACRAN SEL COM CAROTID INNOMINATE UNI R MOD SED  05/13/2022   IR ANGIO INTRA EXTRACRAN SEL INTERNAL CAROTID UNI L MOD SED  10/21/2021   IR ANGIO INTRA EXTRACRAN SEL INTERNAL CAROTID UNI R MOD SED  11/26/2021   IR ANGIO VERTEBRAL SEL VERTEBRAL UNI L MOD SED  10/21/2021   IR ANGIOGRAM FOLLOW UP STUDY  11/26/2021   IR ANGIOGRAM FOLLOW UP STUDY  11/26/2021   IR ANGIOGRAM FOLLOW UP STUDY  11/26/2021   IR TRANSCATH/EMBOLIZ  11/26/2021   LEFT HEART CATH AND CORONARY ANGIOGRAPHY N/A 02/25/2019   Procedure: LEFT HEART CATH AND CORONARY ANGIOGRAPHY;  Surgeon: Mady Bruckner, MD;  Location: [facility] INVASIVE CV LAB;  Service: Cardiovascular;  Laterality: N/A;   RADIOLOGY WITH ANESTHESIA N/A 11/26/2021   Procedure: 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;  Surgeon: Lanis Pupa, MD;  Location[facility];  Service: Radiology;  Laterality: N/A;   REPLACEMENT TOTAL KNEE BILATERAL Bilateral    done in [place]  (2003/2005)   RIGHT/LEFT HEART CATH AND CORONARY ANGIOGRAPHY N/A 09/22/2020   Procedure: RIGHT/LEFT HEART CATH AND CORONARY ANGIOGRAPHY;   Surgeon: Anner Alm ORN, MD;  Location: [facility] INVASIVE CV LAB;  Service: Cardiovascular;  Laterality: N/A;   TUBAL LIGATION     Family History  Problem Relation Age of Onset   Heart disease Mother    Stroke Mother    Hypertension Mother    Cancer Father    Stroke Father     Current Outpatient Medications:    albuterol  (VENTOLIN  HFA) 108 (90 Base) MCG/ACT inhaler, TAKE 2 PUFFS BY MOUTH EVERY 4 HOURS AS NEEDED FOR WHEEZE, Disp: 18 g, Rfl: 3   Alcohol Swabs (B-D SINGLE USE SWABS REGULAR) PADS, Apply topically., Disp: , Rfl:    aspirin  EC 81 MG tablet, Take  1 tablet (81 mg total) by mouth daily. Swallow whole., Disp: , Rfl:    Blood Glucose Calibration (ACCU-CHEK AVIVA) SOLN, 2 Bottles by Other route as needed., Disp: , Rfl:    Budeson-Glycopyrrol-Formoterol  (BREZTRI  AEROSPHERE) 160-9-4.8 MCG/ACT AERO, Inhale 2 puffs into the lungs 2 (two) times daily., Disp: 32.1 g, Rfl: 11   calcitRIOL (ROCALTROL) 0.25 MCG capsule, Take 0.25 mcg by mouth 3 (three) times a week., Disp: , Rfl:    cholecalciferol (VITAMIN D3) 25 MCG (1000 UNIT) tablet, Take 1,000 Units by mouth daily., Disp: , Rfl:    diphenhydrAMINE (BENADRYL) 25 MG tablet, Take 50 mg by mouth daily as needed for allergies., Disp: , Rfl:    fluticasone  (FLONASE ) 50 MCG/ACT nasal spray, Place 2 sprays into both nostrils daily., Disp: 48 mL, Rfl: 2   glucose blood test strip, TEST BLOOD SUGAR TWICE DAILY, Disp: , Rfl:    hydrochlorothiazide (HYDRODIURIL) 25 MG tablet, Take 25 mg by mouth daily., Disp: , Rfl:    insulin  degludec (TRESIBA  FLEXTOUCH) 200 UNIT/ML FlexTouch Pen, Inject 40 Units into the skin daily., Disp: 18 mL, Rfl: 0   Insulin  Syringe-Needle U-100 31G X 5/16 0.5 ML MISC, Use to inject Insulin  four times daily: Use as directed; Dx: E11.9, E66.9, Disp: , Rfl:    ipratropium-albuterol  (DUONEB) 0.5-2.5 (3) MG/3ML SOLN, INHALE CONTENTS OF 1 VIAL VIA NEBULIZER EVERY 6 HOURS AS NEEDED FOR WHEEZING, Disp: 360 mL, Rfl: 0   isosorbide   mononitrate (IMDUR ) 30 MG 24 hr tablet, Take 1 tablet (30 mg total) by mouth daily., Disp: 90 tablet, Rfl: 3   lansoprazole (PREVACID) 15 MG capsule, Take 30 mg by mouth at bedtime., Disp: , Rfl:    montelukast  (SINGULAIR ) 10 MG tablet, TAKE 1 TABLET BY MOUTH EVERYDAY AT BEDTIME, Disp: 90 tablet, Rfl: 1   nitroGLYCERIN  (NITROSTAT ) 0.4 MG SL tablet, Place 1 tablet (0.4 mg total) under the tongue every 5 (five) minutes x 3 doses as needed for chest pain (if no relief after 2nd dose, proceed to ED or call 911)., Disp: 25 tablet, Rfl: 3   potassium chloride  (MICRO-K ) 10 MEQ CR capsule, Take 10 mEq by mouth daily., Disp: , Rfl:    atorvastatin  (LIPITOR) 80 MG tablet, Take 1 tablet (80 mg total) by mouth daily., Disp: 90 tablet, Rfl: 3   metoprolol  tartrate (LOPRESSOR ) 25 MG tablet, Take 1 tablet (25 mg total) by mouth 2 (two) times daily., Disp: 180 tablet, Rfl: 3   pioglitazone  (ACTOS ) 15 MG tablet, Take 1 tablet (15 mg total) by mouth daily. Note dose change, Disp: 90 tablet, Rfl: 3  Allergies  Allergen Reactions   Aspartame Rash   Penicillins Itching and Rash    Did it involve swelling of the face/tongue/throat, SOB, or low BP? No Did it involve sudden or severe rash/hives, skin peeling, or any reaction on the inside of your mouth or nose? No Did you need to seek medical attention at a hospital or doctor's office? No When did it last happen?~25 years ago       If all above answers are "NO", may proceed with cephalosporin use.    Farxiga [Dapagliflozin]     UTIs   Glucose    Metformin     Other Reaction(s): Dizziness  GI distress, dizziness   Metformin And Related     GI distress, dizziness   No [facility] Food Allergies Other (See Comments)    Honey-Rash Artificial Sweeteners-rash Dextrose  measles like symptoms Beet sugar measles like symptoms  Paxil [Paroxetine Hcl] Other (See Comments)    Twitching/feels like skin crawling   Prozac [Fluoxetine Hcl] Other (See Comments)     Twitching/feels like skin crawling   Zoloft [Sertraline Hcl] Other (See Comments)    Twitching/feels like skin crawling   Prednisone  Palpitations   Ranexa  [Ranolazine ] Palpitations    Heart racing     ROS: Review of Systems Pertinent items noted in HPI and remainder of comprehensive ROS otherwise negative.    Physical exam BP (!) 144/71   Pulse (!) 54   Temp 98.6 F (37 C) (Temporal)   Ht 5' 3 (1.6 m)   Wt 223 lb 9.6 oz (101.4 kg)   SpO2 98%   BMI 39.61 kg/m  General appearance: alert, cooperative, appears stated age, no distress, and morbidly obese Head: Normocephalic, without obvious abnormality, atraumatic Eyes: negative findings: lids and lashes normal, conjunctivae and sclerae normal, corneas clear, and pupils equal, round, reactive to light and accomodation Ears: Normal TMs bilaterally.  Wears hearing aid on the right Nose: Nares normal. Septum midline. Mucosa normal. No drainage or sinus tenderness. Throat: lips, mucosa, and tongue normal; teeth and gums normal Neck: no adenopathy, no carotid bruit, supple, symmetrical, trachea midline, and thyroid  not enlarged, symmetric, no tenderness/mass/nodules Back: Slight increased kyphosis with hunched station.  Ambulates independently but gait is antalgic Lungs: clear to auscultation bilaterally Heart: regular rate and rhythm, S1, S2 normal, no murmur, click, rub or gallop Abdomen: Obese, soft, nontender. Extremities: Varicose veins to bilateral feet and ankles.  She has adipose at the ankles but no overt swelling Pulses: 2+ and symmetric Skin: Varicose veins as above Lymph nodes: Anterior cervical lymph nodes and supraclavicular lymph nodes without enlargement Neurologic: Alert and oriented X 3, normal strength and tone. Normal symmetric reflexes. Normal coordination and gait   Diabetic Foot Exam - Simple   Simple Foot Form Diabetic Foot exam was performed with the following findings: Yes 01/12/2024 10:14 AM  Visual  Inspection See comments: Yes Sensation Testing Intact to touch and monofilament testing bilaterally: Yes Pulse Check Posterior Tibialis and Dorsalis pulse intact bilaterally: Yes Comments Varicose veins throughout the feet and ankles.    Lab Results  Component Value Date   HGBA1C 6.2 (H) 06/19/2023       01/12/2024    8:54 AM 11/23/2023   12:44 PM 06/19/2023    9:25 AM  Depression screen PHQ 2/9  Decreased Interest 0 0 0  Down, Depressed, Hopeless 2 0 0  PHQ - 2 Score 2 0 0  Altered sleeping 2  2  Tired, decreased energy   2  Change in appetite 1  1  Feeling bad or failure about yourself  2  1  Trouble concentrating 2  3  Moving slowly or fidgety/restless 3  0  Suicidal thoughts 0  0  PHQ-9 Score 12  9   Difficult doing work/chores   Somewhat difficult     Data saved with a previous flowsheet row definition      06/19/2023    9:25 AM 12/21/2022    9:19 AM 06/29/2022    1:04 PM 02/11/2022    9:10 AM  GAD 7 : Generalized Anxiety Score  Nervous, Anxious, on Edge 0 1 2 1   Control/stop worrying 0 1 0 2  Worry too much - different things 0 0 0 2  Trouble relaxing 0 0 0 2  Restless 0 0 0 1  Easily annoyed or irritable 0 0 0 0  Afraid - awful  might happen 0 0 0 0  Total GAD 7 Score 0 2 2 8   Anxiety Difficulty Not difficult at all Not difficult at all Not difficult at all Somewhat difficult    No results found for this or any previous visit (from the past 2160 hours).   Assessment/ Plan: Dickey Slater Gearing here for annual physical exam.   Annual physical exam  Reactive depression - Plan: AMB Referral [facility] Care Management  Atypical chest pain - Plan: DG Chest 2 View, AMB Referral [facility]BCI Care Management  DM type 2 causing vascular disease (HCC) - Plan: CMP14+EGFR, Bayer DCA Hb A1c Waived, Microalbumin / creatinine urine ratio, pioglitazone  (ACTOS ) 15 MG tablet, AMB Referral [facility]BCI Care Management  Stage 3b chronic kidney disease (HCC) - Plan: CMP14+EGFR, CBC with  Differential, VITAMIN D  25 Hydroxy (Vit-D Deficiency, Fractures), AMB Referral [facility]BCI Care Management  Hypertension associated with diabetes (HCC) - Plan: CMP14+EGFR, metoprolol  tartrate (LOPRESSOR ) 25 MG tablet, AMB Referral [facility]BCI Care Management  Hyperlipidemia associated with type 2 diabetes mellitus (HCC) - Plan: CMP14+EGFR, Lipid Panel, TSH, atorvastatin  (LIPITOR) 80 MG tablet, AMB Referral [facility]BCI Care Management  Age-related osteoporosis without current pathological fracture - Plan: CMP14+EGFR, VITAMIN D  25 Hydroxy (Vit-D Deficiency, Fractures)  Severe persistent extrinsic asthma without complication (HCC) - Plan: CMP14+EGFR, CBC with Differential, DG Chest 2 View, AMB Referral [facility]BCI Care Management  Morbid obesity (HCC) - Plan: CMP14+EGFR, AMB Referral [facility]BCI Care Management   She declined vaccination though implored her to consider pneumococcal vaccination at some point given multiple comorbidities  Diabetic foot exam performed today.  Recommended diabetic eye exam.  All medications renewed.  I will CC her cardiologist to see if there are any other interventions or recommendations.  I did go ahead and get a chest x-ray on her today as well to rule out any intrapulmonary pathology.  Blood pressure is borderline but technically controlled for age.  Check vitamin D  given history of osteoporosis.  Cardiopulmonary exam was unremarkable and I did not appreciate any asthma exacerbation that may be contributing to her lung symptoms.  Reinforced need for balanced diet and adequate exercise as tolerated  Referral to VBC I again for reactive depressive symptoms and again to reinforce the need for preventative care measures in the setting of multiple comorbidities  Counseled on healthy lifestyle choices, including diet (rich in fruits, vegetables and lean meats and low in salt and simple carbohydrates) and exercise (at least 30 minutes of moderate physical activity daily).  Patient to follow up  20m  Amber Mofield M. Jolinda, DO

## 2024-01-13 LAB — CBC WITH DIFFERENTIAL/PLATELET
Basophils Absolute: 0.1 x10E3/uL (ref 0.0–0.2)
Basos: 1 %
EOS (ABSOLUTE): 0.2 x10E3/uL (ref 0.0–0.4)
Eos: 3 %
Hematocrit: 36.2 % (ref 34.0–46.6)
Hemoglobin: 11.5 g/dL (ref 11.1–15.9)
Immature Grans (Abs): 0 x10E3/uL (ref 0.0–0.1)
Immature Granulocytes: 0 %
Lymphocytes Absolute: 2 x10E3/uL (ref 0.7–3.1)
Lymphs: 26 %
MCH: 30.7 pg (ref 26.6–33.0)
MCHC: 31.8 g/dL (ref 31.5–35.7)
MCV: 97 fL (ref 79–97)
Monocytes Absolute: 0.6 x10E3/uL (ref 0.1–0.9)
Monocytes: 8 %
Neutrophils Absolute: 4.9 x10E3/uL (ref 1.4–7.0)
Neutrophils: 62 %
Platelets: 241 x10E3/uL (ref 150–450)
RBC: 3.75 x10E6/uL — ABNORMAL LOW (ref 3.77–5.28)
RDW: 13.9 % (ref 11.7–15.4)
WBC: 7.8 x10E3/uL (ref 3.4–10.8)

## 2024-01-13 LAB — CMP14+EGFR
ALT: 14 IU/L (ref 0–32)
AST: 16 IU/L (ref 0–40)
Albumin: 4.1 g/dL (ref 3.8–4.8)
Alkaline Phosphatase: 58 IU/L (ref 49–135)
BUN/Creatinine Ratio: 15 (ref 12–28)
BUN: 22 mg/dL (ref 8–27)
Bilirubin Total: 0.7 mg/dL (ref 0.0–1.2)
CO2: 24 mmol/L (ref 20–29)
Calcium: 9.2 mg/dL (ref 8.7–10.3)
Chloride: 100 mmol/L (ref 96–106)
Creatinine, Ser: 1.43 mg/dL — ABNORMAL HIGH (ref 0.57–1.00)
Globulin, Total: 3.1 g/dL (ref 1.5–4.5)
Glucose: 90 mg/dL (ref 70–99)
Potassium: 3.9 mmol/L (ref 3.5–5.2)
Sodium: 140 mmol/L (ref 134–144)
Total Protein: 7.2 g/dL (ref 6.0–8.5)
eGFR: 39 mL/min/1.73 — ABNORMAL LOW (ref >59)

## 2024-01-13 LAB — MICROALBUMIN / CREATININE URINE RATIO
Creatinine, Urine: 116.9 mg/dL
Microalb/Creat Ratio: 10 mg/g{creat} (ref 0–29)
Microalbumin, Urine: 12 ug/mL

## 2024-01-13 LAB — LIPID PANEL
Chol/HDL Ratio: 3 ratio (ref 0.0–4.4)
Cholesterol, Total: 121 mg/dL (ref 100–199)
HDL: 41 mg/dL (ref >39)
LDL Chol Calc (NIH): 58 mg/dL (ref 0–99)
Triglycerides: 120 mg/dL (ref 0–149)
VLDL Cholesterol Cal: 22 mg/dL (ref 5–40)

## 2024-01-13 LAB — VITAMIN D 25 HYDROXY (VIT D DEFICIENCY, FRACTURES): Vit D, 25-Hydroxy: 46.5 ng/mL (ref 30.0–100.0)

## 2024-01-13 LAB — TSH: TSH: 7.76 u[IU]/mL — AB (ref 0.450–4.500)

## 2024-01-18 LAB — SPECIMEN STATUS REPORT

## 2024-01-18 LAB — T4, FREE: Free T4: 1.18 ng/dL (ref 0.82–1.77)

## 2024-01-24 ENCOUNTER — Telehealth: Payer: Self-pay

## 2024-01-24 NOTE — Progress Notes (Signed)
 Complex Care Management Note  Care Guide Note 01/24/2024 Name: Amber Reeves MRN: [id number] DOB: 1950/03/16  Amber Reeves is a 73 y.o. year old female who sees Jolinda Norene HERO, DO for primary care. I reached out to Dickey Slater Gearing by phone today to offer complex care management services.  Ms. Vierling was given information about Complex Care Management services today including:   The Complex Care Management services include support from the care team which includes your Nurse Care Manager, Clinical Social Worker, or Pharmacist.  The Complex Care Management team is here to help remove barriers to the health concerns and goals most important to you. Complex Care Management services are voluntary, and the patient may decline or stop services at any time by request to their care team member.   Complex Care Management Consent Status: Patient did not agree to participate in complex care management services at this time.  Follow up plan:    Encounter Outcome:  Patient Refused  Jeoffrey Buffalo , RMA     [facility]  [facility], [facility] Guide  Direct Dial: [phone]  Website: [email or web address]

## 2024-01-29 DIAGNOSIS — I129 Hypertensive chronic kidney disease with stage 1 through stage 4 chronic kidney disease, or unspecified chronic kidney disease: Secondary | ICD-10-CM | POA: Diagnosis not present

## 2024-01-29 DIAGNOSIS — N1831 Chronic kidney disease, stage 3a: Secondary | ICD-10-CM | POA: Diagnosis not present

## 2024-01-29 DIAGNOSIS — G4733 Obstructive sleep apnea (adult) (pediatric): Secondary | ICD-10-CM | POA: Diagnosis not present

## 2024-01-29 DIAGNOSIS — E1122 Type 2 diabetes mellitus with diabetic chronic kidney disease: Secondary | ICD-10-CM | POA: Diagnosis not present

## 2024-02-06 NOTE — Telephone Encounter (Signed)
 Reached out to [employer] regarding re-enrollment application for Tresiba .  Confirmed household size of 2 with rep.  No missing info. Application processed and approved. Enrolled until 02/20/25.   Shipment will process for shipment after 02/22/24.  Patient opted in for text communications for tracking/shipments.

## 2024-02-12 NOTE — Telephone Encounter (Signed)
 PAP: Patient assistance re-enrollment application for Tresiba  has been approved by PAP Companies: [employer] from 02/07/24 to 02/20/25.   Medication should be delivered to: Provider's office.   For further shipping updates, please contact [employer] at [phone].   Patient ID is: [id number]

## 2024-04-01 ENCOUNTER — Ambulatory Visit: Admitting: Cardiology

## 2024-04-09 ENCOUNTER — Ambulatory Visit: Admitting: Cardiology

## 2024-07-17 ENCOUNTER — Ambulatory Visit: Admitting: Family Medicine

## 2024-11-26 ENCOUNTER — Ambulatory Visit: Payer: Self-pay

## 2025-01-13 ENCOUNTER — Encounter: Admitting: Family Medicine
